# Patient Record
Sex: Male | Born: 1970 | Race: Black or African American | Hispanic: No | Marital: Single | Smoking: Light tobacco smoker
Health system: Southern US, Community
[De-identification: ages and names within clinical notes are randomized; demographics above are authoritative.]

## PROBLEM LIST (undated history)

## (undated) DIAGNOSIS — I10 Essential (primary) hypertension: Secondary | ICD-10-CM

## (undated) DIAGNOSIS — F329 Major depressive disorder, single episode, unspecified: Secondary | ICD-10-CM

## (undated) DIAGNOSIS — F32A Depression, unspecified: Secondary | ICD-10-CM

## (undated) HISTORY — PX: WRIST SURGERY: SHX841

---

## 2007-01-25 ENCOUNTER — Ambulatory Visit: Payer: Self-pay | Admitting: Family Medicine

## 2007-01-25 ENCOUNTER — Ambulatory Visit: Payer: Self-pay | Admitting: *Deleted

## 2007-11-26 ENCOUNTER — Ambulatory Visit: Payer: Self-pay | Admitting: Internal Medicine

## 2007-11-29 ENCOUNTER — Ambulatory Visit: Payer: Self-pay | Admitting: Internal Medicine

## 2008-03-14 ENCOUNTER — Ambulatory Visit: Payer: Self-pay | Admitting: Internal Medicine

## 2008-03-15 ENCOUNTER — Inpatient Hospital Stay (HOSPITAL_COMMUNITY): Admission: EM | Admit: 2008-03-15 | Discharge: 2008-03-16 | Payer: Self-pay | Admitting: Emergency Medicine

## 2008-03-15 ENCOUNTER — Encounter (INDEPENDENT_AMBULATORY_CARE_PROVIDER_SITE_OTHER): Payer: Self-pay | Admitting: Emergency Medicine

## 2008-03-15 IMAGING — US US RENAL
1 series · 14 of 23 positions shown · non-contrast
Comparison: None

CLINICAL DATA: Proteinuria

RENAL/URINARY TRACT ULTRASOUND
TECHNIQUE: Complete ultrasound examination of the urinary tract
was performed including evaluation of the kidneys renal collecting
systems and urinary bladder.

[Series 1: unknown · 0.38mm/px · 14 of 23 slices shown]
[im 1/23]
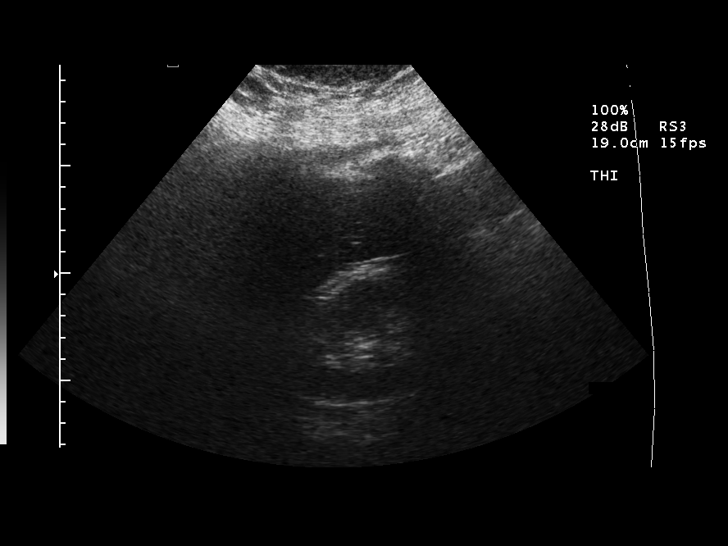
[im 3/23]
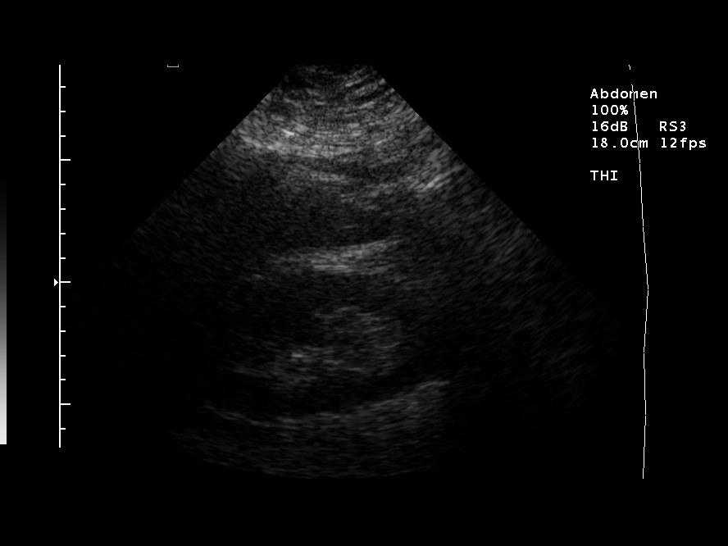
[im 5/23]
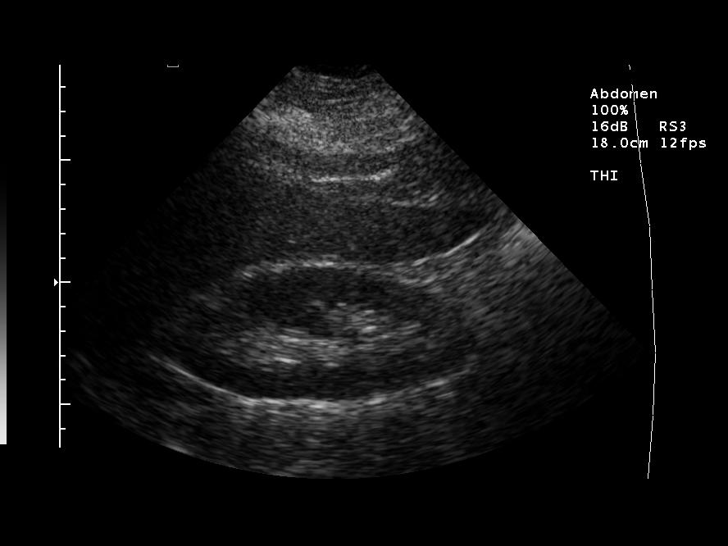
[im 6/23]
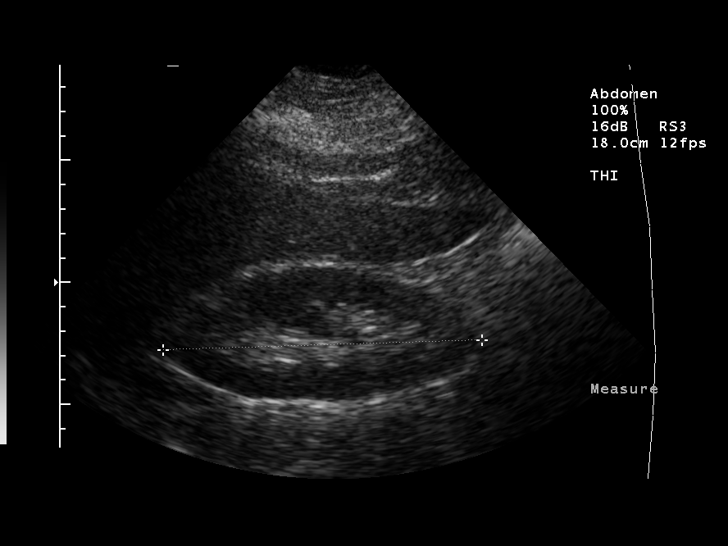
[im 8/23]
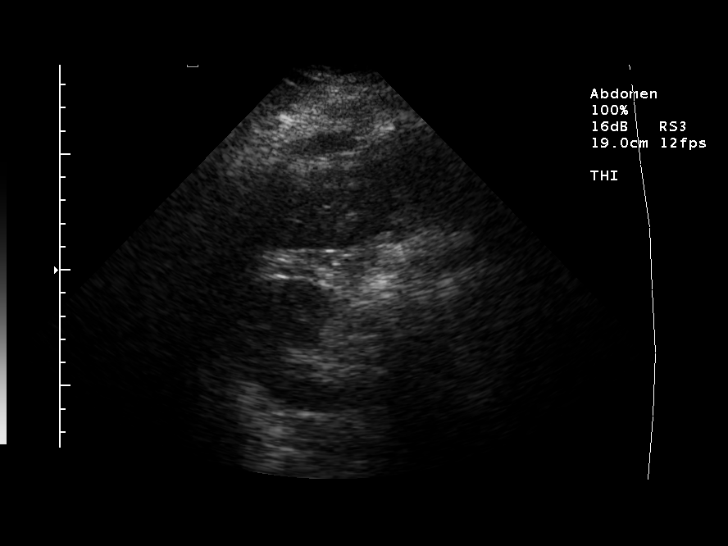
[im 10/23]
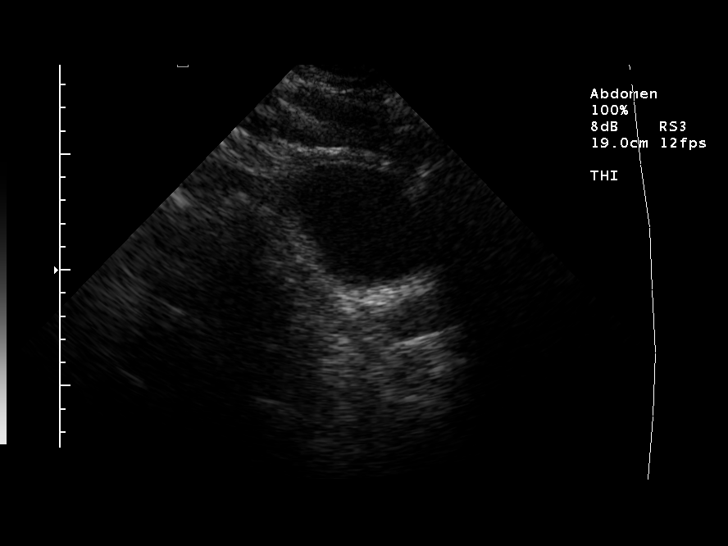
[im 11/23]
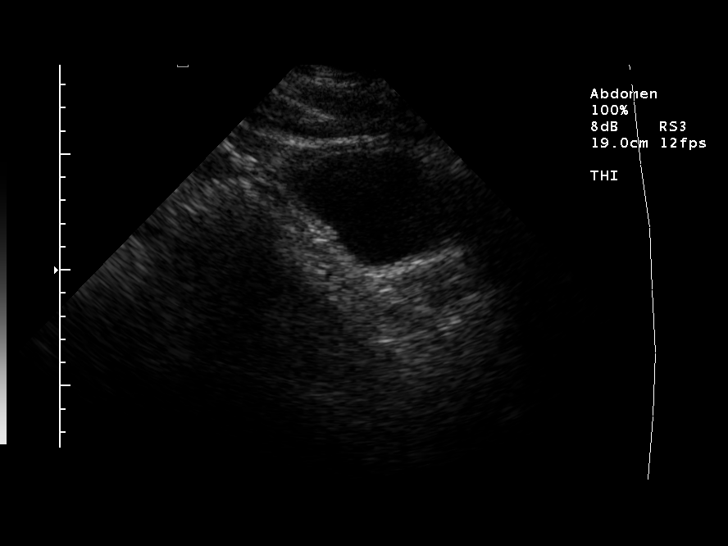
[im 13/23]
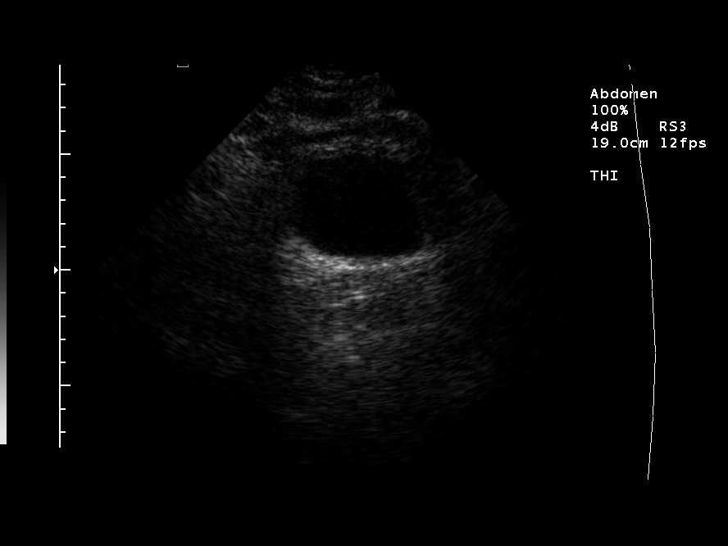
[im 14/23]
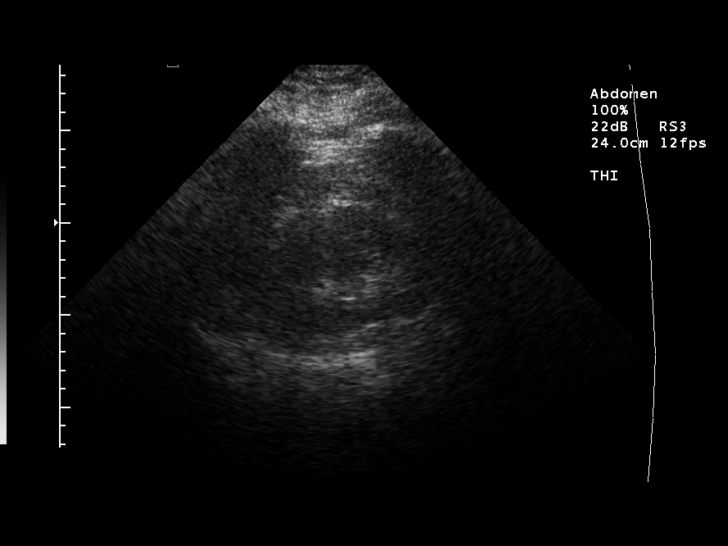
[im 16/23]
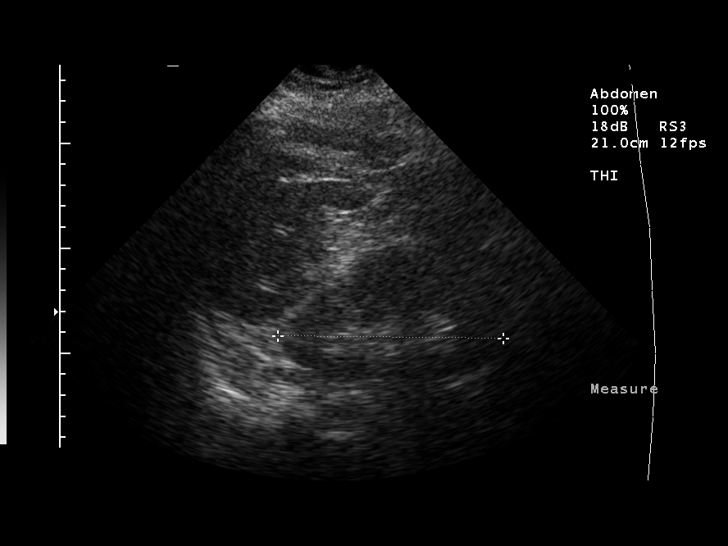
[im 18/23]
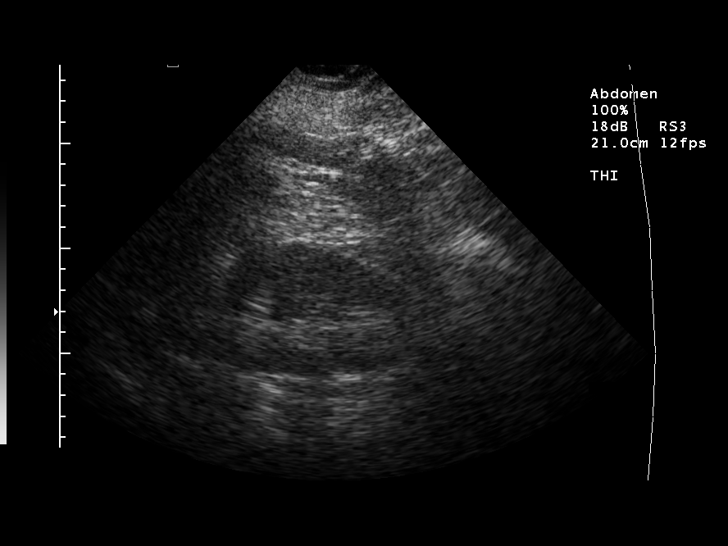
[im 19/23]
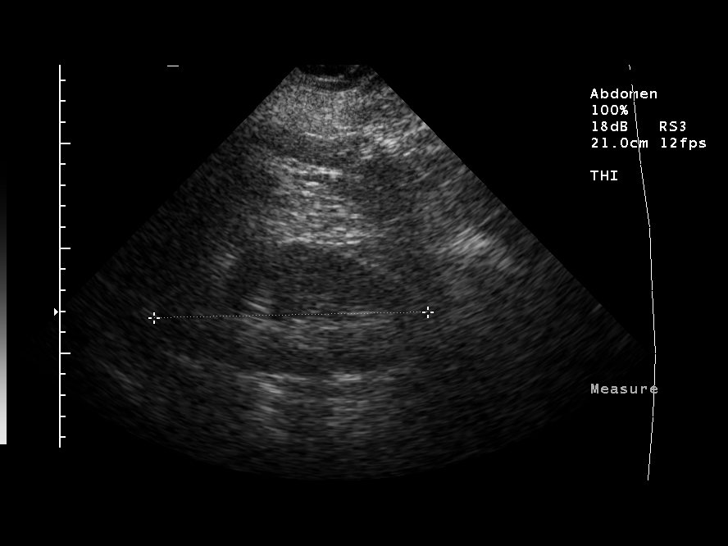
[im 21/23]
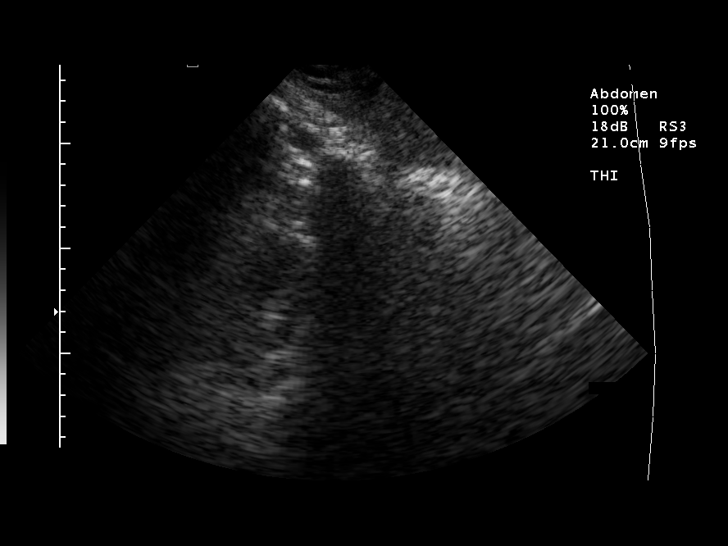
[im 23/23]
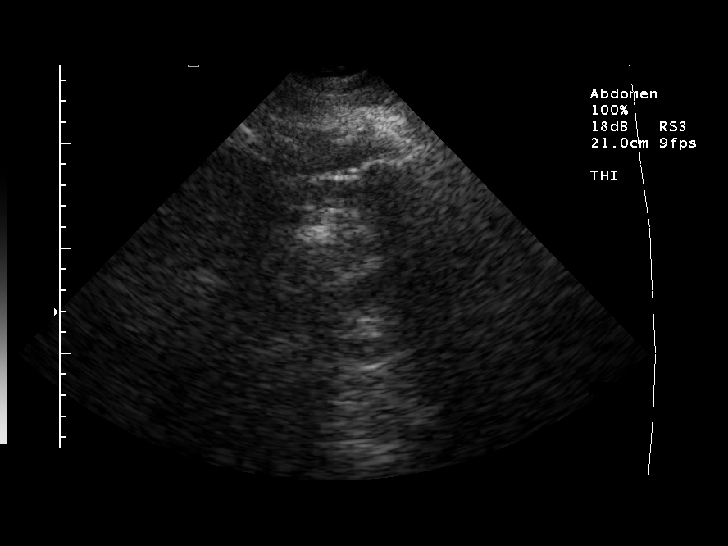

[14 of 23 positions shown; findings below may reference images not displayed]

FINDINGS: The right kidney measures 13.0 cm.  The left kidney
measures 13.0 cm.  Both kidneys demonstrate normal renal cortical
thickness and echogenicity without focal lesions or hydronephrosis.
No perinephric fluid collections are seen.  No renal calculi are
identified.

The bladder is unremarkable.
IMPRESSION: 1.  Unremarkable renal ultrasound examination.

## 2008-03-24 ENCOUNTER — Inpatient Hospital Stay (HOSPITAL_COMMUNITY): Admission: AC | Admit: 2008-03-24 | Discharge: 2008-03-27 | Payer: Self-pay

## 2008-05-02 ENCOUNTER — Encounter: Admission: RE | Admit: 2008-05-02 | Discharge: 2008-07-31 | Payer: Self-pay | Admitting: Orthopedic Surgery

## 2008-08-01 ENCOUNTER — Encounter: Admission: RE | Admit: 2008-08-01 | Discharge: 2008-08-28 | Payer: Self-pay | Admitting: Orthopedic Surgery
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
## 2011-01-07 NOTE — H&P (Signed)
[name] Donovan, [name] Donovan NO.:  [id number]   MEDICAL RECORD NO.:  [id number]          PATIENT TYPE:  EMS   LOCATION:  MAJO                         FACILITY:  [facility]   PHYSICIAN:  Lucita Ferrara, MD         DATE OF BIRTH:  1971/06/23   DATE OF ADMISSION:  03/14/2008  DATE OF DISCHARGE:                              HISTORY & PHYSICAL   PRIMARY CARE PHYSICIAN:  [facility].   HISTORY OF PRESENT ILLNESS:  The patient is a 40 year old who presents  with chest pain that has been ongoing for the last four days getting  progressively worse, located sharp, located in anterior precordial area,  worse upon movement and deep inspiration, gradual in nature.  The  patient apparently states that it radiates to the back.  He denies any  orthopnea, paroxysmal nocturnal dyspnea, lower extremity edema or other  issues.  Chest pain is not related to food.  He denies any fevers,  chills, cough.  He denies any gastritis, gastroesophageal reflux  disease.  He denies any nausea, vomiting.  Risk factors include  hypertension, current smoker, morbid obesity and medication  noncompliance.   PAST MEDICAL HISTORY:  Significant for hypertension.   SOCIAL HISTORY:  He is a smoker, he occasionally smokes marijuana,  occasionally drinks.   ALLERGIES:  Allergic to penicillin which causes a rash.   MEDICATIONS:  Hydrochlorothiazide 25 mg p.o. daily.   REVIEW OF SYSTEMS:  A 12-point otherwise negative.   PHYSICAL EXAMINATION:  GENERAL:  The patient is a morbidly obese male in  moderate distress secondary to his pain that is still currently there.  HEENT: Normocephalic, atraumatic.  Sclerae nonicteric.  NECK:  Supple, no JVD, no carotid bruits.  CARDIOVASCULAR:  S1-S2, regular rate and rhythm.  No murmurs, rubs,  clicks.  ABDOMEN:  Soft, nontender, nondistended.  Positive bowel sounds.  Obese.  LUNGS:  Clear to auscultation bilaterally.  No rhonchi, rales.  There is  some expiratory wheezes.  NEUROLOGICAL:  Patient is alert and oriented x3.  Cranial nerves II-XII  grossly intact.   STUDIES:  EKG shows nonspecific ST-T wave changes, rate 70, no prior EKG  for comparison.  Troponin first set, less than 0.05.  Urinalysis trace  leukocytes, greater than 300 protein, second set of troponins also  negative.  Beta natruretic peptide 35, hemoglobin is 15.8, hematocrit  49.1, platelets 232.  Basic metabolic panel normal.  Chest x-ray shows  mild cardiomegaly and no active lung disease.   ASSESSMENT/PLAN:  A 40 year old with;  1. Chest pain radiating to the back, risk factors include morbid      obesity, smoker, unlikely the patient has aortic dissection.  He is      hemodynamically stable.  He does not have a wide pulse pressure.  2. Increased hemoglobin, rule out chronic hypoxemia, likely secondary      to smoking versus consider obstructive sleep apnea, obstructive      sleep apnea is likely given his body habitus.  3. 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  Consider      nephrotic syndrome secondary to hypertensive nephropathy  likely      early.  4. Uncontrolled hypertension.  5. Positive leukocyte esterase on UA.  6. Morbid obesity.   PLAN:  looks like the patient is pretty noncompliant on his hypertensive  medications.  Yet given that the patient actually has a nephrotic range  proteinuria, it is possible that he has renal dysfunction or secondary  causes of uncontrolled hypertension.  We must rule out renal artery  stenosis, in addition to of working up his chest pain.  In regards to  his chest pain, will initiate acute coronary syndrome protocol.  Will  cycle cardiac enzymes x3 q.8 h, 2-D echocardiogram in the morning.  The  patient also will get bilateral renal ultrasound, will initiate blood  pressure control with metoprolol 25 mg b.i.d., nitro for chest pain as  needed, aspirin and supplemental oxygen, smoking cessation, both  literature and nicotine  patch will be offered, fasting lipid profile and  thyroid stimulating hormone.  The rest of the plans are dependent on his  progress.      Lucita Ferrara, MD  Electronically Signed     RR/MEDQ  D:  03/15/2008  T:  03/15/2008  Job:  [id number]
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
## 2011-01-07 NOTE — Discharge Summary (Signed)
[name], [name]            ACCOUNT NO.:  [id number]   MEDICAL RECORD NO.:  [id number]          PATIENT TYPE:  INP   LOCATION:  4742                         FACILITY:  [facility]   PHYSICIAN:  Hillery Aldo, M.D.   DATE OF BIRTH:  02-Oct-1970   DATE OF ADMISSION:  03/14/2008  DATE OF DISCHARGE:  03/16/2008                               DISCHARGE SUMMARY   PRIMARY CARE PHYSICIAN:  Dr. Reche Dixon at [facility].   DISCHARGE DIAGNOSES:  1. Atypical chest pain, acute coronary syndrome, ruled out.  2. Morbid obesity.  3. Proteinuria.  4. Uncontrolled hypertension.  5. Hyperhomocysteinemia.  6. Dyslipidemia.  7. Tobacco abuse.   DISCHARGE MEDICATIONS:  1. Lisinopril/HCTZ 20/25 mg daily.  2. Metoprolol 25 mg b.i.d.  3. Foltx 1 tablet daily.  4. Aspirin 81 mg daily.  5. Oxycodone/APAP 1-2 q.6h. p.r.n. pain.  6. Lovastatin 20 mg daily.   CONSULTATIONS:  None.   BRIEF ADMISSION HISTORY OF PRESENT ILLNESS:  The patient is a 40-year-  old male who presented to the hospital with a chief complaint of chest  pain for 4 days prior to presentation.  The pain was sharp, located in  the anterior precordial area, and was worse with movement and deep  inspiration.  Apparently, the patient had been moving heavy objects  prior to the onset of this pain.  Nevertheless, due to the patient's  risk factor profile, he was admitted for further evaluation and workup.  For the full details, please see the dictated report done by Dr. Flonnie Overman.   PROCEDURES AND DIAGNOSTIC STUDIES:  1. Chest x-ray on March 14, 2008, showed mild cardiomegaly with no      active lung disease.  2. CT angiogram of the chest on March 15, 2008, showed no evidence of      acute pulmonary emboli.  Cardiomegaly with left ventricular      predominance.  No acute cardiopulmonary disease.  3. Renal ultrasound on March 15, 2008, was unremarkable.  4. Two-dimensional echocardiogram on March 15, 2008, showed normal left      ventricular  systolic function with ejection fraction estimated at      60%.  The study was inadequate for the evaluation of left      ventricular regional wall motion.  Left ventricular wall thickness      was moderately increased.  There was mild aortic root dilatation.      There was mild mitral annular calcification.  The left atrium was      mildly dilated.   DISCHARGE LABORATORY VALUES:  Cardiac markers were negative x3 sets.  Homocystine was 16.  TSH was 2.597.  Cholesterol was 216, triglycerides  139, HDL 33, and LDL 155.   HOSPITAL COURSE:  1. Atypical chest pain.  The patient was admitted and acute coronary      syndrome was ruled out by 3 sets of cardiac markers.  The patient      was monitored closely on telemetry and did not have any evidence of      arrhythmia.  A 12-lead EKG tracing showed normal sinus rhythm.  No  ST or T-wave abnormalities.  Given the markedly atypical nature of      the patient's pain, it was felt that this pain was most likely      musculoskeletal in origin and the patient was put on nonsteroidal      anti-inflammatory medicines with reasonable relief of his pain      prior to discharge.  2. Morbid obesity.  The patient was counseled extensively with regard      to the importance of weight loss and diet.  He was provided with a      written handout with regard to appropriate diet changes prior to      discharge.  3. Proteinuria.  The patient does have proteinuria with a protein-      creatinine ratio found at 0.48.  This is likely due to hypertensive      nephropathy.  Given this, an ACE inhibitor was added to his      medication list.  4. Uncontrolled hypertension.  The patient was on hydrochlorothiazide      monotherapy as an outpatient.  Lisinopril and metoprolol were added      and he will need close followup to ensure that his blood pressure      is adequately controlled on this regimen.  5. Hyperhomocysteinemia.  The patient was started on Foltx  therapy.  6. Dyslipidemia.  The patient was advised to resume his home dose of      Lipitor at discharge.   DISPOSITION:  The patient is medically stable.  He will follow up at  [facility].      Hillery Aldo, M.D.  Electronically Signed     CR/MEDQ  D:  03/16/2008  T:  03/17/2008  Job:  [id number]   cc:   Dineen Kid. Reche Dixon, M.D.
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
## 2011-03-11 ENCOUNTER — Emergency Department (HOSPITAL_COMMUNITY)
Admission: EM | Admit: 2011-03-11 | Discharge: 2011-03-12 | Disposition: A | Discharge status: Home / Self Care | Payer: Self-pay | Attending: Emergency Medicine | Admitting: Emergency Medicine

## 2011-03-11 DIAGNOSIS — F3289 Other specified depressive episodes: Secondary | ICD-10-CM | POA: Insufficient documentation

## 2011-03-11 DIAGNOSIS — Z5900 Homelessness unspecified: Secondary | ICD-10-CM | POA: Insufficient documentation

## 2011-03-11 DIAGNOSIS — I10 Essential (primary) hypertension: Secondary | ICD-10-CM | POA: Insufficient documentation

## 2011-03-11 DIAGNOSIS — F329 Major depressive disorder, single episode, unspecified: Secondary | ICD-10-CM | POA: Insufficient documentation

## 2011-03-11 LAB — COMPREHENSIVE METABOLIC PANEL
BUN: 11 mg/dL (ref 6–23)
Calcium: 9.2 mg/dL (ref 8.4–10.5)
Creatinine, Ser: 1.31 mg/dL (ref 0.50–1.35)
GFR calc Af Amer: >60 mL/min (ref >60)
Glucose, Bld: 105 mg/dL — ABNORMAL HIGH (ref 70–99)
Sodium: 139 mEq/L (ref 135–145)
Total Protein: 8.3 g/dL (ref 6.0–8.3)

## 2011-03-11 LAB — DIFFERENTIAL
Basophils Relative: 1 % (ref 0–1)
Lymphs Abs: 2.5 10*3/uL (ref 0.7–4.0)
Monocytes Absolute: 0.6 10*3/uL (ref 0.1–1.0)
Monocytes Relative: 6 % (ref 3–12)
Neutro Abs: 6.6 10*3/uL (ref 1.7–7.7)

## 2011-03-11 LAB — CBC
HCT: 46.9 % (ref 39.0–52.0)
Hemoglobin: 16 g/dL (ref 13.0–17.0)
MCH: 24.6 pg — ABNORMAL LOW (ref 26.0–34.0)
MCHC: 34.1 g/dL (ref 30.0–36.0)
MCV: 72.2 fL — ABNORMAL LOW (ref 78.0–100.0)

## 2011-03-11 LAB — ETHANOL: Alcohol, Ethyl (B): 311 mg/dL — ABNORMAL HIGH (ref 0–11)

## 2011-03-11 LAB — RAPID URINE DRUG SCREEN, HOSP PERFORMED
Benzodiazepines: NOT DETECTED
Cocaine: NOT DETECTED
Opiates: NOT DETECTED

## 2011-05-23 LAB — CBC
HCT: 47.6
Hemoglobin: 15.2
Hemoglobin: 14.2
MCV: 76 — ABNORMAL LOW
Platelets: 252
Platelets: 232
RBC: 6.46 — ABNORMAL HIGH
RBC: 5.81
WBC: 8.2
WBC: 9

## 2011-05-23 LAB — POCT I-STAT, CHEM 8
BUN: 10
Calcium, Ion: 1.06 — ABNORMAL LOW
Calcium, Ion: 1.02 — ABNORMAL LOW
Chloride: 107
Glucose, Bld: 91
HCT: 52
HCT: 53 — ABNORMAL HIGH
Hemoglobin: 18 — ABNORMAL HIGH
Potassium: 3.7
Sodium: 140
Sodium: 139
TCO2: 20
TCO2: 25

## 2011-05-23 LAB — TYPE AND SCREEN

## 2011-05-23 LAB — CARDIAC PANEL(CRET KIN+CKTOT+MB+TROPI)
CK, MB: 1.3
Total CK: 229
Troponin I: 0.01

## 2011-05-23 LAB — COMPREHENSIVE METABOLIC PANEL
ALT: 24
AST: 22
Albumin: 3.4 — ABNORMAL LOW
Alkaline Phosphatase: 60
CO2: 26
Chloride: 109
Creatinine, Ser: 1.14
GFR calc Af Amer: >60
GFR calc non Af Amer: >60
Potassium: 3.7
Sodium: 141
Total Bilirubin: 0.8

## 2011-05-23 LAB — PROTIME-INR: INR: 1

## 2011-05-23 LAB — DIFFERENTIAL
Eosinophils Absolute: 0.2
Lymphocytes Relative: 30
Lymphs Abs: 2.6
Monocytes Relative: 9
Neutro Abs: 5.1
Neutrophils Relative %: 57

## 2011-05-23 LAB — PROTEIN / CREATININE RATIO, URINE
Protein Creatinine Ratio: 0.48 — ABNORMAL HIGH
Total Protein, Urine: 41

## 2011-05-23 LAB — D-DIMER, QUANTITATIVE: D-Dimer, Quant: 0.5 — ABNORMAL HIGH

## 2011-05-23 LAB — CK TOTAL AND CKMB (NOT AT ARMC)
CK, MB: 1.1
Total CK: 259 — ABNORMAL HIGH

## 2011-05-23 LAB — URINALYSIS, ROUTINE W REFLEX MICROSCOPIC
Ketones, ur: 15 — AB
Nitrite: NEGATIVE
pH: 7.5

## 2011-05-23 LAB — TSH: TSH: 2.597

## 2011-05-23 LAB — TROPONIN I: Troponin I: 0.01

## 2011-05-23 LAB — POCT CARDIAC MARKERS
Myoglobin, poc: 54.1
Operator id: 277751
Operator id: 277751
Troponin i, poc: <0.05

## 2011-05-23 LAB — B-NATRIURETIC PEPTIDE (CONVERTED LAB): Pro B Natriuretic peptide (BNP): 35

## 2011-05-23 LAB — URINE MICROSCOPIC-ADD ON

## 2011-05-23 LAB — LIPID PANEL: VLDL: 28

## 2011-05-23 LAB — ABO/RH: ABO/RH(D): O POS

## 2012-01-05 ENCOUNTER — Emergency Department (HOSPITAL_COMMUNITY)
Admission: EM | Admit: 2012-01-05 | Discharge: 2012-01-05 | Disposition: A | Discharge status: Home / Self Care | Payer: Self-pay | Attending: Emergency Medicine | Admitting: Emergency Medicine

## 2012-01-05 ENCOUNTER — Encounter (HOSPITAL_COMMUNITY): Payer: Self-pay | Admitting: Emergency Medicine

## 2012-01-05 DIAGNOSIS — F329 Major depressive disorder, single episode, unspecified: Secondary | ICD-10-CM | POA: Insufficient documentation

## 2012-01-05 DIAGNOSIS — F3289 Other specified depressive episodes: Secondary | ICD-10-CM | POA: Insufficient documentation

## 2012-01-05 DIAGNOSIS — K047 Periapical abscess without sinus: Secondary | ICD-10-CM

## 2012-01-05 DIAGNOSIS — I10 Essential (primary) hypertension: Secondary | ICD-10-CM

## 2012-01-05 DIAGNOSIS — K0889 Other specified disorders of teeth and supporting structures: Secondary | ICD-10-CM

## 2012-01-05 HISTORY — DX: Depression, unspecified: F32.A

## 2012-01-05 HISTORY — DX: Major depressive disorder, single episode, unspecified: F32.9

## 2012-01-05 HISTORY — DX: Essential (primary) hypertension: I10

## 2012-01-05 MED ORDER — CLINDAMYCIN HCL 300 MG PO CAPS: 300.0000 mg | ORAL_CAPSULE | Freq: Four times a day (QID) | ORAL | Status: AC

## 2012-01-05 MED ORDER — IBUPROFEN 200 MG PO TABS
400.0000 mg | ORAL_TABLET | Freq: Once | ORAL | Status: AC
  Administered 2012-01-05: 400 mg via ORAL
  Filled 2012-01-05: qty 2

## 2012-01-05 MED ORDER — HYDROMORPHONE HCL PF 1 MG/ML IJ SOLN
1.0000 mg | Freq: Once | INTRAMUSCULAR | Status: AC
  Administered 2012-01-05: 1 mg via INTRAMUSCULAR
  Filled 2012-01-05: qty 1

## 2012-01-05 MED ORDER — OXYCODONE-ACETAMINOPHEN 5-325 MG PO TABS: 1.0000 | ORAL_TABLET | ORAL | Status: AC | PRN

## 2012-01-05 MED ORDER — AMLODIPINE BESYLATE 10 MG PO TABS: 10.0000 mg | ORAL_TABLET | Freq: Every day | ORAL | Status: DC

## 2012-01-05 NOTE — ED Notes (Signed)
Pt received to RM 5 with c/o toothache x 1 week with swelling noted to the rt side of the face. Pt has a hx of HTN and has been out of his BP medicine x 1 week.[name] Donovan

## 2012-01-05 NOTE — ED Notes (Signed)
Pt was instructed to take his BP medication as soon as he gets his prescription filled and to follow up with a doctor. Pt verbalized ubnderstanding

## 2012-01-05 NOTE — ED Notes (Signed)
Has been out of bp meds x a couple of weeks

## 2012-01-05 NOTE — ED Provider Notes (Signed)
History  This chart was scribed for [name] Razor, MD by Bennett Scrape. This patient was seen in room STRE5/STRE5 and the patient's care was started at 4:08PM.  CSN: [id number]  Arrival date & time 01/05/12  1346   First MD Initiated Contact with Patient 01/05/12 1608      Chief Complaint  Patient presents with  . Dental Pain    The history is provided by the patient. No language interpreter was used.    [name] Donovan is a 41 y.o. male who presents to the Emergency Department complaining of 5 to 6 days of right upper tooth pain attributed to an abscess with associated chills, facial swelling and nausea. Pt states that the pain became worse today after he sneezed and felt the abscess burst and begin to drain. He reports several episodes of emesis described as "spitting the abscess stuff back up". The pain is worse with swallowing and chewing. He has not  Taken any medications at home to improve his pain. He reports one pior episode of similar pain in the same area 2 to 3 years ago that was also attributed to an abscess. He denies diarrhea, fever and sore throat as associated symptoms. He has a h/o HTN and depression. He is a current everyday smoker and alcohol user.   Past Medical History  Diagnosis Date  . Hypertension   . Depression     No past surgical history on file.  No family history on file.  History  Substance Use Topics  . Smoking status: Current Everyday Smoker  . Smokeless tobacco: Not on file  . Alcohol Use: Yes      Review of Systems  Constitutional: Negative for fever and chills.  HENT: Positive for facial swelling and dental problem. Negative for trouble swallowing.   Respiratory: Negative for shortness of breath.   Gastrointestinal: Positive for nausea and vomiting. Negative for abdominal pain and diarrhea.  Neurological: Negative for weakness and headaches.  All other systems reviewed and are negative.    Allergies  Penicillins  Home  Medications   Current Outpatient Rx  Name Route Sig Dispense Refill  . AMLODIPINE BESYLATE 10 MG PO TABS Oral Take 10 mg by mouth daily.    . IBUPROFEN 200 MG PO TABS Oral Take 800 mg by mouth every 6 (six) hours as needed. For tooth pain      Triage Vitals: BP 187/119  Pulse 91  Temp 98.6 F (37 C)  Resp 18  SpO2 98%  Physical Exam  Nursing note and vitals reviewed. Constitutional: He is oriented to person, place, and time. He appears well-developed and well-nourished. No distress.  HENT:  Head: Normocephalic and atraumatic.       Right upper three molars surrounding ginga is inflamed, no abscess noted, no drainage noted, mild right facial swelling, posterior pharynx is clear, handling secretions, the submental tissue is soft  Eyes: EOM are normal.  Neck: Neck supple. No tracheal deviation present.  Cardiovascular: Normal rate.   Pulmonary/Chest: Effort normal. No respiratory distress.       Coarse breath sounds bilaterally  Musculoskeletal: Normal range of motion.  Lymphadenopathy:    He has no cervical adenopathy.  Neurological: He is alert and oriented to person, place, and time.  Skin: Skin is warm and dry.  Psychiatric: He has a normal mood and affect. His behavior is normal.    ED Course  Procedures (including critical care time)  DIAGNOSTIC STUDIES: Oxygen Saturation is 98% on room air, normal by  my interpretation.    COORDINATION OF CARE: 4:30PM-Discussed antibiotics and pain medication as discharge plan with pt and pt agreed. Advised pt to see a dentist to follow up with. Will give pt a pain shot before discharge.   Labs Reviewed - No data to display No results found.   1. Dental abscess   2. Pain, dental   3. Hypertension       MDM  41 year old male with dental pain. Patient reports purulent discharge. No discrete drainable collection noted on my exam. Patient is afebrile. No evidence of deep space infection. NO evidence of airway compromise. Patient  is penicillin allergic. Has tolerated clindamycin in the past. Does not have insurance. Patient was given paperwork on how to proceed in obtaining his orange card. History of hypertension and previously prescribed Norvasc. Patient has not been taking because he did not have a refill for his meds. Prescription for additional amlodipine provided. Pain medication. Dental referral. Resource list provided.   I personally preformed the services scribed in my presence. The recorded information has been reviewed and considered. [name] Razor, MD.    [name] Razor, MD 01/05/12 [phone]

## 2012-01-05 NOTE — Discharge Instructions (Signed)
Abscessed Tooth A tooth abscess is a collection of infected fluid (pus) from a bacterial infection in the inner part of the tooth (pulp). It usually occurs at the end of the tooth's root.  CAUSES   A very bad cavity (extensive tooth decay).   Trauma to the tooth, such as a broken or chipped tooth, that allows bacteria to enter into the pulp.  SYMPTOMS  Severe pain in and around the infected tooth.   Swelling and redness around the abscessed tooth or in the mouth or face.   Tenderness.   Pus drainage.   Bad breath.   Bitter taste in the mouth.   Difficulty swallowing.   Difficulty opening the mouth.   Feeling sick to your stomach (nauseous).   Vomiting.   Chills.   Swollen neck glands.  DIAGNOSIS  A medical and dental history will be taken.   An examination will be performed by tapping on the abscessed tooth.   X-rays may be taken of the tooth to identify the abscess.  TREATMENT The goal of treatment is to eliminate the infection.   You may be prescribed antibiotic medicine to stop the infection from spreading.   A root canal may be performed to save the tooth. If the tooth cannot be saved, it may be pulled (extracted) and the abscess may be drained.  HOME CARE INSTRUCTIONS  Only take over-the-counter or prescription medicines for pain, fever, or discomfort as directed by your caregiver.   Do not drive after taking pain medicine (narcotics).   Rinse your mouth (gargle) often with salt water ( tsp salt in 8 oz of warm water) to relieve pain or swelling.   Do not apply heat to the outside of your face.   Return to your dentist for further treatment as directed.  SEEK IMMEDIATE DENTAL CARE IF:  You have a temperature by mouth above 102 F (38.9 C), not controlled by medicine.   You have chills or a very bad headache.   You have problems breathing or swallowing.   Your have trouble opening your mouth.   You develop swelling in the neck or around the eye.    Your pain is not helped by medicine.   Your pain is getting worse instead of better.  Document Released: 08/11/2005 Document Revised: 07/31/2011 Document Reviewed: 11/19/2010 [facility] Patient Information 2012 [place], [place].Aspiration Precautions Aspiration is the inhaling of a liquid or object into the lungs. Things that can be inhaled into the lungs include:  Food.   Any type of liquid, such as drinks or saliva.   Stomach contents, such as vomit or stomach acid.  When these things go into the lungs, damage can occur. Serious complications can then result, such as:  A lung infection (pneumonia).   A collection of pus in the lungs (lung abscess).  CAUSES  A decreased level of awareness (consciousness) due to:   Traumatic brain injury or head injury.   Stroke.   Neurological disease.   Seizures.   Decreased or absent gag reflex (inability to cough).   Medical conditions that affect swallowing.   Conditions that affect the food pipe (esophagus) such as a narrowing of the esophagus (esophageal stricture).   Gastroesophageal reflux (GERD). This is also known as acid reflux.   Any type of surgery where you are put under general anesthesia or have sedation.   Drinking large amounts of alcohol.   Taking medication that causes drowsiness, confusion, or weakness.   Aging.   Dental problems.  Having a feeding tube.  SYMPTOMS When aspiration occurs, different signs and symptoms can occur, such as:  Coughing (if a person has a cough or gag reflex) after swallowing food or liquids.   Difficulty breathing. This can include things like:   Breathing rapidly.   Breathing very slowly.   Hearing "gurgling" lung sounds when a person breaths.   Coughing up phlegm (sputum) that is:   Yellow, tan, or green in color.   Has pieces of food in it.   Bad smelling.   A change in voice (hoarseness).   A change in skin color. The skin may turn a "bluish" type color  because of a lack of oxygen (cyanosis).   Fever.  DIAGNOSIS  A chest X-ray may be performed. This takes a picture of your lungs. It can show changes in the lungs if aspiration has occurred.   A bronchoscopy may be performed. This is a surgical procedure in which a thin, flexible tube with a camera at the end is inserted into the nose or mouth. The tube is advanced to the lungs so your caregiver can view the lungs and obtain a culture, tissue sample, or remove an aspirated object.   A swallowing evaluation study may be performed to evaluate:   A person's risk of aspiration.   How difficult it is for a person to swallow.   What types of foods are safe for a person to eat.  PREVENTION If you are a caregiver to someone who may aspirate, follow the directions below. If you are caring for someone who can eat and drink through their mouth:  Have them sit in an upright position when eating food or drinking fluids, such as:   Sitting up in a chair.   If sitting in a chair is not possible, position the person in bed so they are upright.   Remind the person to eat slowly and chew well.   Do not distract the person. This is especially important for people with thinking or memory (cognitive) problems.   Check the person's mouth for leftover food after eating.   Keep the person sitting upright for 30 to 45 minutes after eating.   Do not serve food or drink for at least 2 hours before bedtime.  If you are caring for someone with a feeding tube and he or she cannot eat or drink through their mouth:  Keep the person in an upright position as much as possible.   Do not  lay the person flat if they are getting continuous feedings. Turn the feeding pump off if you need to lay the person flat for any reason.   Check feeding tube residuals as directed by your caregiver. If a large amount of tube feedings are pulled back (aspirated) from the feeding tube, call your caregiver right away.  General  guidelines to prevent aspiration include:  Feed small amounts of food. Do not force feed.   Use as little water as possible when brushing the person's teeth or cleaning his or her mouth.   Provide oral care before and after meals.   Never put food or fluids in the mouth of a person who is not fully alert.   Crush pills and put them in soft food such as pudding or ice cream. Some pills should not be crushed. Check with your caregiver before crushing any medication.  SEEK IMMEDIATE MEDICAL CARE IF:   The person has trouble breathing or starts to breathe rapidly.   The  person is breathing very slowly or stops breathing.   The person coughs a lot after eating or drinking.   The person has a chronic cough.   The person coughs up thick, yellow, or tan sputum.   The person has a fever or persistent symptoms for more than 72 hours.   The person has a fever and their symptoms suddenly get worse.  Document Released: 09/13/2010 Document Revised: 07/31/2011 Document Reviewed: 09/13/2010 [facility] Patient Information 2012 [place], [place].Arterial Hypertension Arterial hypertension (high blood pressure) is a condition of elevated pressure in your blood vessels. Hypertension over a long period of time is a risk factor for strokes, heart attacks, and heart failure. It is also the leading cause of kidney (renal) failure.  CAUSES   In Adults -- Over 90% of all hypertension has no known cause. This is called essential or primary hypertension. In the other 10% of people with hypertension, the increase in blood pressure is caused by another disorder. This is called secondary hypertension. Important causes of secondary hypertension are:   Heavy alcohol use.   Obstructive sleep apnea.   Hyperaldosterosim (Conn's syndrome).   Steroid use.   Chronic kidney failure.   Hyperparathyroidism.   Medications.   Renal artery stenosis.   Pheochromocytoma.   Cushing's disease.   Coarctation of the  aorta.   Scleroderma renal crisis.   Licorice (in excessive amounts).   Drugs (cocaine, methamphetamine).  Your caregiver can explain any items above that apply to you.  In Children -- Secondary hypertension is more common and should always be considered.   Pregnancy -- Few women of childbearing age have high blood pressure. However, up to 10% of them develop hypertension of pregnancy. Generally, this will not harm the woman. It may be a sign of 3 complications of pregnancy: preeclampsia, HELLP syndrome, and eclampsia. Follow up and control with medication is necessary.  SYMPTOMS   This condition normally does not produce any noticeable symptoms. It is usually found during a routine exam.   Malignant hypertension is a late problem of high blood pressure. It may have the following symptoms:   Headaches.   Blurred vision.   End-organ damage (this means your kidneys, heart, lungs, and other organs are being damaged).   Stressful situations can increase the blood pressure. If a person with normal blood pressure has their blood pressure go up while being seen by their caregiver, this is often termed "white coat hypertension." Its importance is not known. It may be related with eventually developing hypertension or complications of hypertension.   Hypertension is often confused with mental tension, stress, and anxiety.  DIAGNOSIS  The diagnosis is made by 3 separate blood pressure measurements. They are taken at least 1 week apart from each other. If there is organ damage from hypertension, the diagnosis may be made without repeat measurements. Hypertension is usually identified by having blood pressure readings:  Above 140/90 mmHg measured in both arms, at 3 separate times, over a couple weeks.   Over 130/80 mmHg should be considered a risk factor and may require treatment in patients with diabetes.  Blood pressure readings over 120/80 mmHg are called "pre-hypertension" even in  non-diabetic patients. To get a true blood pressure measurement, use the following guidelines. Be aware of the factors that can alter blood pressure readings.  Take measurements at least 1 hour after caffeine.   Take measurements 30 minutes after smoking and without any stress. This is another reason to quit smoking - it raises your  blood pressure.   Use a proper cuff size. Ask your caregiver if you are not sure about your cuff size.   Most home blood pressure cuffs are automatic. They will measure systolic and diastolic pressures. The systolic pressure is the pressure reading at the start of sounds. Diastolic pressure is the pressure at which the sounds disappear. If you are elderly, measure pressures in multiple postures. Try sitting, lying or standing.   Sit at rest for a minimum of 5 minutes before taking measurements.   You should not be on any medications like decongestants. These are found in many cold medications.   Record your blood pressure readings and review them with your caregiver.  If you have hypertension:  Your caregiver may do tests to be sure you do not have secondary hypertension (see "causes" above).   Your caregiver may also look for signs of metabolic syndrome. This is also called Syndrome X or Insulin Resistance Syndrome. You may have this syndrome if you have type 2 diabetes, abdominal obesity, and abnormal blood lipids in addition to hypertension.   Your caregiver will take your medical and family history and perform a physical exam.   Diagnostic tests may include blood tests (for glucose, cholesterol, potassium, and kidney function), a urinalysis, or an EKG. Other tests may also be necessary depending on your condition.  PREVENTION  There are important lifestyle issues that you can adopt to reduce your chance of developing hypertension:  Maintain a normal weight.   Limit the amount of salt (sodium) in your diet.   Exercise often.   Limit alcohol intake.     Get enough potassium in your diet. Discuss specific advice with your caregiver.   Follow a DASH diet (dietary approaches to stop hypertension). This diet is rich in fruits, vegetables, and low-fat dairy products, and avoids certain fats.  PROGNOSIS  Essential hypertension cannot be cured. Lifestyle changes and medical treatment can lower blood pressure and reduce complications. The prognosis of secondary hypertension depends on the underlying cause. Many people whose hypertension is controlled with medicine or lifestyle changes can live a normal, healthy life.  RISKS AND COMPLICATIONS  While high blood pressure alone is not an illness, it often requires treatment due to its short- and long-term effects on many organs. Hypertension increases your risk for:  CVAs or strokes (cerebrovascular accident).   Heart failure due to chronically high blood pressure (hypertensive cardiomyopathy).   Heart attack (myocardial infarction).   Damage to the retina (hypertensive retinopathy).   Kidney failure (hypertensive nephropathy).  Your caregiver can explain list items above that apply to you. Treatment of hypertension can significantly reduce the risk of complications. TREATMENT   For overweight patients, weight loss and regular exercise are recommended. Physical fitness lowers blood pressure.   Mild hypertension is usually treated with diet and exercise. A diet rich in fruits and vegetables, fat-free dairy products, and foods low in fat and salt (sodium) can help lower blood pressure. Decreasing salt intake decreases blood pressure in a 1/3 of people.   Stop smoking if you are a smoker.  The steps above are highly effective in reducing blood pressure. While these actions are easy to suggest, they are difficult to achieve. Most patients with moderate or severe hypertension end up requiring medications to bring their blood pressure down to a normal level. There are several classes of medications for  treatment. Blood pressure pills (antihypertensives) will lower blood pressure by their different actions. Lowering the blood pressure by 10 mmHg  may decrease the risk of complications by as much as 25%. The goal of treatment is effective blood pressure control. This will reduce your risk for complications. Your caregiver will help you determine the best treatment for you according to your lifestyle. What is excellent treatment for one person, may not be for you. HOME CARE INSTRUCTIONS   Do not smoke.   Follow the lifestyle changes outlined in the "Prevention" section.   If you are on medications, follow the directions carefully. Blood pressure medications must be taken as prescribed. Skipping doses reduces their benefit. It also puts you at risk for problems.   Follow up with your caregiver, as directed.   If you are asked to monitor your blood pressure at home, follow the guidelines in the "Diagnosis" section above.  SEEK MEDICAL CARE IF:   You think you are having medication side effects.   You have recurrent headaches or lightheadedness.   You have swelling in your ankles.   You have trouble with your vision.  SEEK IMMEDIATE MEDICAL CARE IF:   You have sudden onset of chest pain or pressure, difficulty breathing, or other symptoms of a heart attack.   You have a severe headache.   You have symptoms of a stroke (such as sudden weakness, difficulty speaking, difficulty walking).  MAKE SURE YOU:   Understand these instructions.   Will watch your condition.   Will get help right away if you are not doing well or get worse.  Document Released: 08/11/2005 Document Revised: 07/31/2011 Document Reviewed: 03/11/2007 [facility] Patient Information 2012 [place], [place].  RESOURCE GUIDE  Dental Problems  Patients with Medicaid: [facility] [phone] [address]                                           [phone] [address] Phone:   [phone]                                                  Phone:  [phone]  If unable to pay or uninsured, contact:  [facility] or [facility] to become qualified for the adult dental clinic.  Chronic Pain Problems Contact [facility]  [phone] Patients need to be referred by their primary care doctor.  Insufficient Money for Medicine Contact [employer]:  call "211" or [facility] (214) 871-4943.  No Primary Care Doctor Call [facility]  [phone] Other agencies that provide inexpensive medical care    [facility]  [phone]    [facility]  [phone]    [facility]  [phone]    [facility]  [phone]    [facility]  903-531-8157    [facility]  [phone]  Psychological Services [facility]  [phone] [facility]  [phone] [facility]   [phone] (emergency services 617-404-2640)  Substance Abuse Resources [facility]  308-365-8007 [facility] 581-641-5673 [facility] 719-272-4324 Floydene Flock 205-196-0659 Residential & Outpatient Substance Abuse Program  (304) 474-3782  Abuse/Neglect [facility] Child Abuse Hotline (531)220-4941 [place]  [address] Child Abuse Hotline 262-807-2790 (After Hours)  Emergency Shelter [facility] 587-610-8066  Maternity Homes [facility] [place] [facility] (458) 860-7831 [facility] 947-061-3419  MRSA Hotline #:   (564)704-1726    [facility] Resources  [facility]     [employer]                          [facility] Dept. [address] [place]                       [address]      [address]  Blondell Reveal Phone:  [phone]                                   Phone:  [phone]                 Phone:   [phone]  [facility] Phone:  [phone]  [facility] Child Abuse Hotline (661)257-1074 [phone] (After Hours)
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
## 2012-08-15 ENCOUNTER — Encounter (HOSPITAL_COMMUNITY): Payer: Self-pay | Admitting: Emergency Medicine

## 2012-08-15 ENCOUNTER — Emergency Department (HOSPITAL_COMMUNITY): Payer: Self-pay

## 2012-08-15 ENCOUNTER — Emergency Department (HOSPITAL_COMMUNITY)
Admission: EM | Admit: 2012-08-15 | Discharge: 2012-08-15 | Disposition: A | Discharge status: Home Health | Payer: Self-pay | Attending: Emergency Medicine | Admitting: Emergency Medicine

## 2012-08-15 DIAGNOSIS — F17200 Nicotine dependence, unspecified, uncomplicated: Secondary | ICD-10-CM | POA: Insufficient documentation

## 2012-08-15 DIAGNOSIS — I10 Essential (primary) hypertension: Secondary | ICD-10-CM | POA: Insufficient documentation

## 2012-08-15 DIAGNOSIS — Z8659 Personal history of other mental and behavioral disorders: Secondary | ICD-10-CM | POA: Insufficient documentation

## 2012-08-15 DIAGNOSIS — S30811A Abrasion of abdominal wall, initial encounter: Secondary | ICD-10-CM

## 2012-08-15 DIAGNOSIS — S0081XA Abrasion of other part of head, initial encounter: Secondary | ICD-10-CM

## 2012-08-15 DIAGNOSIS — T07XXXA Unspecified multiple injuries, initial encounter: Secondary | ICD-10-CM

## 2012-08-15 DIAGNOSIS — IMO0002 Reserved for concepts with insufficient information to code with codable children: Secondary | ICD-10-CM | POA: Insufficient documentation

## 2012-08-15 DIAGNOSIS — S301XXA Contusion of abdominal wall, initial encounter: Secondary | ICD-10-CM | POA: Insufficient documentation

## 2012-08-15 DIAGNOSIS — F1010 Alcohol abuse, uncomplicated: Secondary | ICD-10-CM | POA: Insufficient documentation

## 2012-08-15 DIAGNOSIS — F10929 Alcohol use, unspecified with intoxication, unspecified: Secondary | ICD-10-CM

## 2012-08-15 DIAGNOSIS — Z23 Encounter for immunization: Secondary | ICD-10-CM | POA: Insufficient documentation

## 2012-08-15 LAB — CBC WITH DIFFERENTIAL/PLATELET
Basophils Relative: 1 % (ref 0–1)
HCT: 47.5 % (ref 39.0–52.0)
Hemoglobin: 16.2 g/dL (ref 13.0–17.0)
MCHC: 34.1 g/dL (ref 30.0–36.0)
MCV: 72.3 fL — ABNORMAL LOW (ref 78.0–100.0)
Monocytes Absolute: 0.6 10*3/uL (ref 0.1–1.0)
Monocytes Relative: 7 % (ref 3–12)
Neutro Abs: 4.6 10*3/uL (ref 1.7–7.7)

## 2012-08-15 LAB — COMPREHENSIVE METABOLIC PANEL
ALT: 28 U/L (ref 0–53)
Alkaline Phosphatase: 72 U/L (ref 39–117)
CO2: 26 mEq/L (ref 19–32)
Calcium: 8.7 mg/dL (ref 8.4–10.5)
GFR calc Af Amer: >90 mL/min (ref >90)
GFR calc non Af Amer: 82 mL/min — ABNORMAL LOW (ref >90)
Glucose, Bld: 82 mg/dL (ref 70–99)
Potassium: 3.9 mEq/L (ref 3.5–5.1)
Sodium: 142 mEq/L (ref 135–145)

## 2012-08-15 MED ORDER — IOHEXOL 300 MG/ML  SOLN
100.0000 mL | Freq: Once | INTRAMUSCULAR | Status: AC | PRN
  Administered 2012-08-15: 100 mL via INTRAVENOUS

## 2012-08-15 MED ORDER — TETANUS-DIPHTH-ACELL PERTUSSIS 5-2.5-18.5 LF-MCG/0.5 IM SUSP
0.5000 mL | Freq: Once | INTRAMUSCULAR | Status: AC
  Administered 2012-08-15: 0.5 mL via INTRAMUSCULAR
  Filled 2012-08-15: qty 0.5

## 2012-08-15 MED ORDER — IBUPROFEN 200 MG PO TABS: 800.0000 mg | ORAL_TABLET | Freq: Four times a day (QID) | ORAL | Status: DC | PRN
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
## 2012-08-15 NOTE — ED Notes (Signed)
Returned from CT. Pt is alert to verbal stimuli
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
## 2012-08-15 NOTE — ED Notes (Signed)
abd is soft
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
## 2012-08-15 NOTE — ED Notes (Signed)
Sandwich bag and drink given to pt

## 2012-08-15 NOTE — ED Notes (Signed)
Patient transported to CT 

## 2012-08-15 NOTE — ED Notes (Signed)
MD at bedside. 

## 2012-08-15 NOTE — ED Notes (Signed)
Pt reports that he has not been taking his HTN medicine.

## 2012-08-15 NOTE — ED Notes (Signed)
Pt is sleeping. Awakens easily.

## 2013-02-23 ENCOUNTER — Emergency Department (HOSPITAL_COMMUNITY): Payer: Self-pay

## 2013-02-23 ENCOUNTER — Emergency Department (HOSPITAL_COMMUNITY)
Admission: EM | Admit: 2013-02-23 | Discharge: 2013-02-23 | Disposition: A | Discharge status: Home / Self Care | Payer: Self-pay | Attending: Emergency Medicine | Admitting: Emergency Medicine

## 2013-02-23 ENCOUNTER — Encounter (HOSPITAL_COMMUNITY): Payer: Self-pay

## 2013-02-23 DIAGNOSIS — S90851A Superficial foreign body, right foot, initial encounter: Secondary | ICD-10-CM

## 2013-02-23 DIAGNOSIS — I10 Essential (primary) hypertension: Secondary | ICD-10-CM | POA: Insufficient documentation

## 2013-02-23 DIAGNOSIS — R0602 Shortness of breath: Secondary | ICD-10-CM | POA: Insufficient documentation

## 2013-02-23 DIAGNOSIS — N390 Urinary tract infection, site not specified: Secondary | ICD-10-CM | POA: Insufficient documentation

## 2013-02-23 DIAGNOSIS — F17200 Nicotine dependence, unspecified, uncomplicated: Secondary | ICD-10-CM | POA: Insufficient documentation

## 2013-02-23 DIAGNOSIS — Y9301 Activity, walking, marching and hiking: Secondary | ICD-10-CM | POA: Insufficient documentation

## 2013-02-23 DIAGNOSIS — Z8659 Personal history of other mental and behavioral disorders: Secondary | ICD-10-CM | POA: Insufficient documentation

## 2013-02-23 DIAGNOSIS — Y929 Unspecified place or not applicable: Secondary | ICD-10-CM | POA: Insufficient documentation

## 2013-02-23 DIAGNOSIS — W268XXA Contact with other sharp object(s), not elsewhere classified, initial encounter: Secondary | ICD-10-CM | POA: Insufficient documentation

## 2013-02-23 DIAGNOSIS — Z79899 Other long term (current) drug therapy: Secondary | ICD-10-CM | POA: Insufficient documentation

## 2013-02-23 DIAGNOSIS — Z880 Allergy status to penicillin: Secondary | ICD-10-CM | POA: Insufficient documentation

## 2013-02-23 DIAGNOSIS — IMO0002 Reserved for concepts with insufficient information to code with codable children: Secondary | ICD-10-CM | POA: Insufficient documentation

## 2013-02-23 LAB — CBC WITH DIFFERENTIAL/PLATELET
Basophils Relative: 1 % (ref 0–1)
Eosinophils Absolute: 0.1 10*3/uL (ref 0.0–0.7)
MCH: 25.8 pg — ABNORMAL LOW (ref 26.0–34.0)
MCHC: 34.3 g/dL (ref 30.0–36.0)
Neutrophils Relative %: 65 % (ref 43–77)
Platelets: 200 10*3/uL (ref 150–400)
RDW: 15.1 % (ref 11.5–15.5)

## 2013-02-23 LAB — BASIC METABOLIC PANEL
GFR calc Af Amer: >90 mL/min (ref >90)
GFR calc non Af Amer: 80 mL/min — ABNORMAL LOW (ref >90)
Potassium: 3.9 mEq/L (ref 3.5–5.1)
Sodium: 140 mEq/L (ref 135–145)

## 2013-02-23 LAB — URINE MICROSCOPIC-ADD ON

## 2013-02-23 LAB — URINALYSIS, ROUTINE W REFLEX MICROSCOPIC
Nitrite: POSITIVE — AB
Specific Gravity, Urine: 1.042 — ABNORMAL HIGH (ref 1.005–1.030)
Urobilinogen, UA: 1 mg/dL (ref 0.0–1.0)

## 2013-02-23 LAB — D-DIMER, QUANTITATIVE: D-Dimer, Quant: 1.03 ug/mL-FEU — ABNORMAL HIGH (ref 0.00–0.48)

## 2013-02-23 MED ORDER — HYDRALAZINE HCL 20 MG/ML IJ SOLN
10.0000 mg | Freq: Once | INTRAMUSCULAR | Status: AC
  Administered 2013-02-23: 10 mg via INTRAVENOUS
  Filled 2013-02-23: qty 1

## 2013-02-23 MED ORDER — IOHEXOL 350 MG/ML SOLN
100.0000 mL | Freq: Once | INTRAVENOUS | Status: AC | PRN
  Administered 2013-02-23: 100 mL via INTRAVENOUS

## 2013-02-23 MED ORDER — METOPROLOL TARTRATE 1 MG/ML IV SOLN
5.0000 mg | Freq: Once | INTRAVENOUS | Status: AC
  Administered 2013-02-23: 5 mg via INTRAVENOUS
  Filled 2013-02-23: qty 5

## 2013-02-23 MED ORDER — HYDROCHLOROTHIAZIDE 50 MG PO TABS
50.0000 mg | ORAL_TABLET | Freq: Every day | ORAL | Status: DC
  Administered 2013-02-23: 50 mg via ORAL
  Filled 2013-02-23: qty 1

## 2013-02-23 MED ORDER — LIDOCAINE-EPINEPHRINE 2 %-1:100000 IJ SOLN
20.0000 mL | Freq: Once | INTRAMUSCULAR | Status: AC
  Administered 2013-02-23: 10 mL
  Filled 2013-02-23: qty 20

## 2013-02-23 MED ORDER — DOXYCYCLINE HYCLATE 100 MG PO CAPS: 100.0000 mg | ORAL_CAPSULE | Freq: Two times a day (BID) | ORAL | Status: DC

## 2013-02-23 MED ORDER — AMLODIPINE BESYLATE 10 MG PO TABS: 10.0000 mg | ORAL_TABLET | Freq: Every day | ORAL | Status: DC

## 2013-02-23 MED ORDER — HYDROCHLOROTHIAZIDE 50 MG PO TABS: 25.0000 mg | ORAL_TABLET | Freq: Every day | ORAL | Status: DC

## 2013-02-23 MED ORDER — AMLODIPINE BESYLATE 10 MG PO TABS
10.0000 mg | ORAL_TABLET | Freq: Every day | ORAL | Status: DC
  Administered 2013-02-23: 10 mg via ORAL
  Filled 2013-02-23: qty 1

## 2013-02-23 MED ORDER — LABETALOL HCL 5 MG/ML IV SOLN
20.0000 mg | Freq: Once | INTRAVENOUS | Status: AC
  Administered 2013-02-23: 20 mg via INTRAVENOUS
  Filled 2013-02-23: qty 4

## 2013-02-23 NOTE — ED Provider Notes (Signed)
History    CSN: [id number] Arrival date & time 02/23/13  1622  First MD Initiated Contact with Patient 02/23/13 1713     Chief Complaint  Patient presents with  . Foot Pain   (Consider location/radiation/quality/duration/timing/severity/associated sxs/prior Treatment) HPI Comments: Patient is concerned there is glass in his foot. He reports he was walking barefoot 2 nights ago when he stepped on a broken beer bottle. He feels like there is a piece of glass in the bottom of his foot he removed with tweezers. Denies any weakness, numbness or tingling. She also reports being out of his blood pressure medications for 6 months. Tetanus is up to date. Denies any headache, vision change, chest pain. He said intermittent shortness of breath at times. Denies any leg pain or swelling.   The history is provided by the patient.   Past Medical History  Diagnosis Date  . Hypertension   . Depression    History reviewed. No pertinent past surgical history. History reviewed. No pertinent family history. History  Substance Use Topics  . Smoking status: Current Every Day Smoker  . Smokeless tobacco: Not on file  . Alcohol Use: Yes    Review of Systems  Constitutional: Negative for fever, activity change and appetite change.  HENT: Negative for congestion and rhinorrhea.   Respiratory: Positive for shortness of breath. Negative for cough and chest tightness.   Cardiovascular: Negative for chest pain.  Gastrointestinal: Negative for nausea, vomiting and abdominal pain.  Genitourinary: Negative for dysuria.  Musculoskeletal: Positive for myalgias and arthralgias.  Neurological: Negative for dizziness, weakness and headaches.  A complete 10 system review of systems was obtained and all systems are negative except as noted in the HPI and PMH.     Allergies  Penicillins  Home Medications   Current Outpatient Rx  Name  Route  Sig  Dispense  Refill  . amLODipine (NORVASC) 10 MG tablet    Oral   Take 10 mg by mouth daily.         Marland Kitchen amLODipine (NORVASC) 10 MG tablet   Oral   Take 1 tablet (10 mg total) by mouth daily.   30 tablet   0   . doxycycline (VIBRAMYCIN) 100 MG capsule   Oral   Take 1 capsule (100 mg total) by mouth 2 (two) times daily.   20 capsule   0   . hydrochlorothiazide (HYDRODIURIL) 50 MG tablet   Oral   Take 0.5 tablets (25 mg total) by mouth daily.   30 tablet   0    BP 146/98  Pulse 89  Temp(Src) 98.4 F (36.9 C) (Oral)  Resp 21  SpO2 98% Physical Exam  Constitutional: He is oriented to person, place, and time. He appears well-developed and well-nourished. No distress.  HENT:  Head: Normocephalic and atraumatic.  Mouth/Throat: Oropharynx is clear and moist. No oropharyngeal exudate.  Eyes: Conjunctivae and EOM are normal. Pupils are equal, round, and reactive to light.  Neck: Normal range of motion. Neck supple.  Cardiovascular: Normal rate, regular rhythm and normal heart sounds.   No murmur heard. Pulmonary/Chest: Effort normal and breath sounds normal. No respiratory distress.  Abdominal: Soft. There is no tenderness. There is no rebound and no guarding.  Musculoskeletal: Normal range of motion. He exhibits no edema and no tenderness.  R foot with superificial linear laceration. This is a bit deeper at the medial aspect. There is no palpable foreign body. There is no bleeding. +2 DP and PT pulses. Able  to wiggle toes, ankle flexion-extension intact  Neurological: He is alert and oriented to person, place, and time. No cranial nerve deficit. He exhibits normal muscle tone. Coordination normal.  Skin: Skin is warm.    ED Course  FOREIGN BODY REMOVAL Date/Time: 02/23/2013 6:19 PM Performed by: Glynn Octave Authorized by: Glynn Octave Consent: Verbal consent obtained. Risks and benefits: risks, benefits and alternatives were discussed Consent given by: patient Patient understanding: patient states understanding of the  procedure being performed Patient identity confirmed: verbally with patient and provided demographic data Time out: Immediately prior to procedure a "time out" was called to verify the correct patient, procedure, equipment, support staff and site/side marked as required. Body area: skin General location: lower extremity Location details: right foot Anesthesia: local infiltration Local anesthetic: lidocaine 2% with epinephrine Anesthetic total: 8 ml Patient sedated: no Patient restrained: no Patient cooperative: yes Localization method: probed and visualized Removal mechanism: hemostat Dressing: antibiotic ointment and dressing applied Tendon involvement: superficial and none Depth: subcutaneous Complexity: simple 1 objects recovered. Objects recovered: glass Post-procedure assessment: foreign body removed Patient tolerance: Patient tolerated the procedure well with no immediate complications.   (including critical care time) Labs Reviewed  CBC WITH DIFFERENTIAL - Abnormal; Notable for the following:    RBC 6.48 (*)    MCV 75.2 (*)    MCH 25.8 (*)    All other components within normal limits  BASIC METABOLIC PANEL - Abnormal; Notable for the following:    Glucose, Bld 100 (*)    GFR calc non Af Amer 80 (*)    All other components within normal limits  URINALYSIS, ROUTINE W REFLEX MICROSCOPIC - Abnormal; Notable for the following:    Color, Urine AMBER (*)    APPearance CLOUDY (*)    Specific Gravity, Urine 1.042 (*)    Hgb urine dipstick MODERATE (*)    Bilirubin Urine MODERATE (*)    Ketones, ur 40 (*)    Protein, ur >300 (*)    Nitrite POSITIVE (*)    Leukocytes, UA SMALL (*)    All other components within normal limits  D-DIMER, QUANTITATIVE - Abnormal; Notable for the following:    D-Dimer, Quant 1.03 (*)    All other components within normal limits  URINE MICROSCOPIC-ADD ON - Abnormal; Notable for the following:    Bacteria, UA FEW (*)    Casts HYALINE CASTS (*)     All other components within normal limits  URINE CULTURE  TROPONIN I   Ct Head Wo Contrast  02/23/2013   *RADIOLOGY REPORT*  Clinical Data: Headache.  CT HEAD WITHOUT CONTRAST  Technique:  Contiguous axial images were obtained from the base of the skull through the vertex without contrast.  Comparison: Head CT scan 08/15/2012.  Findings: The brain appears normal without infarct, hemorrhage, mass lesion, mass effect, midline shift or abnormal extra-axial fluid collection.  No hydrocephalus or pneumocephalus.  The calvarium is intact.  Large expansile low attenuating lesion in the right maxillary sinus is partially visualized.  Mild ethmoid air cell disease is noted.  IMPRESSION:  1.  No acute abnormality. 2.  Large low attenuating lesion in the right maxillary sinus is partially visualized but likely reflects a mucocele.   Original Report Authenticated By: Holley Dexter, M.D.   Dg Foot Complete Right  02/23/2013   *RADIOLOGY REPORT*  Clinical Data: The patient stepped on a broken piece of glass 2 days ago.  Laceration at the proximal fourth and fifth metatarsals.  RIGHT FOOT COMPLETE -  3+ VIEW  Comparison: None.  Findings: No fracture, dislocation or radiopaque foreign body is identified.  IMPRESSION: Negative for foreign body or fracture.   Original Report Authenticated By: Holley Dexter, M.D.   Ct Angio Chest Aortic Dissect W &/or W/o  02/23/2013   *RADIOLOGY REPORT*  Clinical Data:  Elevated blood pressure with chest and abdominal pain.  CT ANGIOGRAPHY CHEST, ABDOMEN AND PELVIS  Technique:  Multidetector CT imaging through the chest, abdomen and pelvis was performed using the standard protocol during bolus administration of intravenous contrast.  Multiplanar reconstructed images including MIPs were obtained and reviewed to evaluate the vascular anatomy.  Contrast: OMNIPAQUE IOHEXOL 350 MG/ML SOLN  Comparison:  08/15/2012  CTA CHEST  Findings:  The chest wall is unremarkable.  No masses or  adenopathy.  There are small scattered axillary and supraclavicular lymph nodes.  The thyroid gland is grossly normal.  The bony thorax is intact.  No destructive bone lesions or spinal canal compromise. Moderate degenerative changes are noted throughout the thoracic spine.  The heart is normal in size.  No pericardial effusion.  No mediastinal or hilar mass or adenopathy.  Small scattered lymph nodes are noted.  The aorta is normal in caliber.  No dissection.  The branch vessels are patent.  No coronary artery calcifications are identified.  The pulmonary arterial tree is fairly well opacified.  No definite filling defects to suggest pulmonary emboli.  The esophagus is grossly normal.  Examination of the lung parenchyma demonstrates no acute pulmonary findings.  No pulmonary lesions.  No pleural effusion.   Review of the MIP images confirms the above findings.  IMPRESSION:  1.  Normal CT appearance of the thoracic aorta.  No aneurysm or dissection. 2.  Aortic branch vessels are patent.  No coronary artery calcifications. 3.  Normal pulmonary arteries. 4.  No acute pulmonary findings. 5.  Advanced degenerative changes in the thoracic and lumbar spine for age.  CTA ABDOMEN AND PELVIS  Findings:  The abdominal aorta is normal in caliber.  No dissection.  The branch vessels are patent.  Minimal atherosclerotic calcifications involving the iliac arteries but no aneurysm or dissection.  The solid abdominal organs are grossly normal.  The gallbladder is normal.  No common bile duct dilatation.  The stomach, duodenum, small bowel and colon are grossly normal without oral contrast.  No mesenteric or retroperitoneal mass or adenopathy.  The bladder, prostate gland and seminal vesicles are unremarkable. No pelvic mass, adenopathy or free pelvic fluid collections.  No inguinal mass or hernia.  The bony pelvis is intact.  Mild hip, pubic symphysis and SI joint degenerative changes.  Moderate degenerative changes involving the  lumbar spine.   Review of the MIP images confirms the above findings.  IMPRESSION:  1.  Unremarkable CT examination of the abdominal aorta and branch vessels.  No aortic aneurysm or dissection. 2.  No acute abdominal/pelvic findings, mass lesions or adenopathy.   Original Report Authenticated By: Rudie Meyer, M.D.   Ct Cta Abd/pel W/cm &/or W/o Cm  02/23/2013   *RADIOLOGY REPORT*  Clinical Data:  Elevated blood pressure with chest and abdominal pain.  CT ANGIOGRAPHY CHEST, ABDOMEN AND PELVIS  Technique:  Multidetector CT imaging through the chest, abdomen and pelvis was performed using the standard protocol during bolus administration of intravenous contrast.  Multiplanar reconstructed images including MIPs were obtained and reviewed to evaluate the vascular anatomy.  Contrast: OMNIPAQUE IOHEXOL 350 MG/ML SOLN  Comparison:  08/15/2012  CTA CHEST  Findings:  The chest wall is unremarkable.  No masses or adenopathy.  There are small scattered axillary and supraclavicular lymph nodes.  The thyroid gland is grossly normal.  The bony thorax is intact.  No destructive bone lesions or spinal canal compromise. Moderate degenerative changes are noted throughout the thoracic spine.  The heart is normal in size.  No pericardial effusion.  No mediastinal or hilar mass or adenopathy.  Small scattered lymph nodes are noted.  The aorta is normal in caliber.  No dissection.  The branch vessels are patent.  No coronary artery calcifications are identified.  The pulmonary arterial tree is fairly well opacified.  No definite filling defects to suggest pulmonary emboli.  The esophagus is grossly normal.  Examination of the lung parenchyma demonstrates no acute pulmonary findings.  No pulmonary lesions.  No pleural effusion.   Review of the MIP images confirms the above findings.  IMPRESSION:  1.  Normal CT appearance of the thoracic aorta.  No aneurysm or dissection. 2.  Aortic branch vessels are patent.  No coronary artery  calcifications. 3.  Normal pulmonary arteries. 4.  No acute pulmonary findings. 5.  Advanced degenerative changes in the thoracic and lumbar spine for age.  CTA ABDOMEN AND PELVIS  Findings:  The abdominal aorta is normal in caliber.  No dissection.  The branch vessels are patent.  Minimal atherosclerotic calcifications involving the iliac arteries but no aneurysm or dissection.  The solid abdominal organs are grossly normal.  The gallbladder is normal.  No common bile duct dilatation.  The stomach, duodenum, small bowel and colon are grossly normal without oral contrast.  No mesenteric or retroperitoneal mass or adenopathy.  The bladder, prostate gland and seminal vesicles are unremarkable. No pelvic mass, adenopathy or free pelvic fluid collections.  No inguinal mass or hernia.  The bony pelvis is intact.  Mild hip, pubic symphysis and SI joint degenerative changes.  Moderate degenerative changes involving the lumbar spine.   Review of the MIP images confirms the above findings.  IMPRESSION:  1.  Unremarkable CT examination of the abdominal aorta and branch vessels.  No aortic aneurysm or dissection. 2.  No acute abdominal/pelvic findings, mass lesions or adenopathy.   Original Report Authenticated By: Rudie Meyer, M.D.   1. Foreign body in foot, right, initial encounter   2. Hypertension   3. Urinary tract infection     MDM  Foreign body sensation in her right foot after stepping on broken glass. Blood pressure medications for 6 months. No current headache, chest pain. Has had recurrent shortness of breath intermittently for several months.  Glass removed from foot as above. No evidence of fracture. Tetanus up-to-date. Patient's blood pressure is uncontrolled he states has been out of medicines for 6 months.  Is given his medications and IV hydralazine in ED. Given his unequal upper extremity blood pressures, CTA was performed to rule out dissection.  This was negative.  Blood pressure improved to  140/92 after treatment. Discussed with patient importance of compliance with blood pressure medication. His foot wound was thoroughly irrigated and will be left open. He is placed on prophylactic antibiotics. This should cover his possible UTI as well. he is given a referral to primary care.   Date: 02/23/2013  Rate: 85  Rhythm: normal sinus rhythm  QRS Axis: normal  Intervals: normal  ST/T Wave abnormalities: normal  Conduction Disutrbances:none  Narrative Interpretation:   Old EKG Reviewed: none available    Glynn Octave, MD 02/23/13 2355

## 2013-02-23 NOTE — ED Notes (Signed)
Pt reports stepping on a piece of glass Sunday night, unable to remove the piece of glass at home on his own, pt is unable to put full weight on his Right foot. Pt's BP was high in triage, pt reports he has not taken his HTN medication since January, states he was going to [employer] and has not been able to find another pcp since.

## 2013-02-25 LAB — URINE CULTURE: Culture: NO GROWTH

## 2013-04-28 ENCOUNTER — Encounter: Payer: Self-pay | Admitting: Internal Medicine

## 2013-04-28 ENCOUNTER — Ambulatory Visit: Payer: Self-pay | Attending: Internal Medicine | Admitting: Internal Medicine

## 2013-04-28 ENCOUNTER — Ambulatory Visit (HOSPITAL_COMMUNITY)
Admission: RE | Admit: 2013-04-28 | Discharge: 2013-04-28 | Disposition: A | Discharge status: Home / Self Care | Payer: Self-pay | Source: Ambulatory Visit | Attending: Internal Medicine | Admitting: Internal Medicine

## 2013-04-28 VITALS — BP 173/126 | HR 73 | Temp 99.1°F | Resp 16 | Ht 70.47 in | Wt 298.0 lb

## 2013-04-28 DIAGNOSIS — I10 Essential (primary) hypertension: Secondary | ICD-10-CM | POA: Insufficient documentation

## 2013-04-28 DIAGNOSIS — G4733 Obstructive sleep apnea (adult) (pediatric): Secondary | ICD-10-CM | POA: Insufficient documentation

## 2013-04-28 DIAGNOSIS — R634 Abnormal weight loss: Secondary | ICD-10-CM | POA: Insufficient documentation

## 2013-04-28 DIAGNOSIS — R5381 Other malaise: Secondary | ICD-10-CM | POA: Insufficient documentation

## 2013-04-28 LAB — CMP AND LIVER
AST: 31 U/L (ref 0–37)
Albumin: 3.8 g/dL (ref 3.5–5.2)
BUN: 15 mg/dL (ref 6–23)
Calcium: 9.1 mg/dL (ref 8.4–10.5)
Chloride: 105 mEq/L (ref 96–112)
Glucose, Bld: 85 mg/dL (ref 70–99)
Indirect Bilirubin: 0.4 mg/dL (ref 0.0–0.9)
Potassium: 3.8 mEq/L (ref 3.5–5.3)

## 2013-04-28 LAB — LIPID PANEL
HDL: 51 mg/dL (ref >39)
LDL Cholesterol: 103 mg/dL — ABNORMAL HIGH (ref 0–99)
Total CHOL/HDL Ratio: 3.6 Ratio
Triglycerides: 141 mg/dL (ref <150)

## 2013-04-28 MED ORDER — LISINOPRIL-HYDROCHLOROTHIAZIDE 20-25 MG PO TABS: 1.0000 | ORAL_TABLET | Freq: Every day | ORAL | Status: DC

## 2013-04-28 MED ORDER — CLONIDINE HCL 0.1 MG PO TABS
0.2000 mg | ORAL_TABLET | Freq: Once | ORAL | Status: AC
  Administered 2013-04-28: 0.2 mg via ORAL

## 2013-04-28 NOTE — Progress Notes (Signed)
Pt is here to establish care. Pt has hypertension BP today is 173/113mmhg Since January 2014 pt is having rapid weight loss, 5 pounds a week He also states that he is having inconstant stool.

## 2013-04-28 NOTE — Progress Notes (Signed)
Patient ID: [name] Donovan, male   DOB: 24-Feb-1971, 42 y.o.   MRN: [id number]  CC: To establish care  HPI: Patient is a 42 year old gentleman who presented to our clinic today to establish medical care. His major concerns are: Weight loss unintentionally for the past 6 months, he said he has lost almost 70 pounds unintentionally, he is also having diarrhea about 6 times a day watery, no unusual odor, no blood, no associated vomiting no abdominal pain. Patient also noticed that he's been tired more lately, and easily sleepy. He confessed to snoring loudly with episodes of apnea during sleep, he has been through several time by witnesses that he has apnea during sleeping. He was diagnosed with hypertension long time ago and was supposed to be on medication but he has not been taking them. He last time he went to the ED, his blood pressure was very high and he was started on Norvasc 10 mg tablet daily, he took that for a while and then stopped again. Today he denies chest pain, no headache, no palpitation, no abdominal pain, no shortness of breath.  Looking at his previous records he also has protein in his urine, he told me he was informed in the ER that his hypertension may be causing his kidneys to leak protein in urine. No leg swelling. Allergies  Allergen Reactions  . Penicillins Hives, Shortness Of Breath and Rash    dizziness   Past Medical History  Diagnosis Date  . Hypertension   . Depression    Current Outpatient Prescriptions on File Prior to Visit  Medication Sig Dispense Refill  . amLODipine (NORVASC) 10 MG tablet Take 10 mg by mouth daily.      Marland Kitchen amLODipine (NORVASC) 10 MG tablet Take 1 tablet (10 mg total) by mouth daily.  30 tablet  0  . doxycycline (VIBRAMYCIN) 100 MG capsule Take 1 capsule (100 mg total) by mouth 2 (two) times daily.  20 capsule  0  . hydrochlorothiazide (HYDRODIURIL) 50 MG tablet Take 0.5 tablets (25 mg total) by mouth daily.  30 tablet  0   No current  facility-administered medications on file prior to visit.   Family History  Problem Relation Age of Onset  . Hypertension Mother   . Heart disease Father   . Heart disease Brother    History   Social History  . Marital Status: Single    Spouse Name: N/A    Number of Children: N/A  . Years of Education: N/A   Occupational History  . Not on file.   Social History Main Topics  . Smoking status: Current Every Day Smoker    Types: Cigarettes  . Smokeless tobacco: Not on file  . Alcohol Use: Yes  . Drug Use: 1.00 per week    Special: Marijuana  . Sexual Activity: Not on file   Other Topics Concern  . Not on file   Social History Narrative  . No narrative on file    Review of Systems: Constitutional: Negative for fever, chills, diaphoresis, activity change, appetite change and fatigue. HENT: Negative for ear pain, nosebleeds, congestion, facial swelling, rhinorrhea, neck pain, neck stiffness and ear discharge.  Eyes: Negative for pain, discharge, redness, itching and visual disturbance. Respiratory: Negative for cough, choking, chest tightness, shortness of breath, wheezing and stridor.  Cardiovascular: Negative for chest pain, palpitations and leg swelling. Gastrointestinal: Negative for abdominal distention. Genitourinary: Negative for dysuria, urgency, frequency, hematuria, flank pain, decreased urine volume, difficulty urinating and dyspareunia.  Musculoskeletal:  Negative for back pain, joint swelling, arthralgias and gait problem. Neurological: Negative for dizziness, tremors, seizures, syncope, facial asymmetry, speech difficulty, weakness, light-headedness, numbness and headaches.  Hematological: Negative for adenopathy. Does not bruise/bleed easily. Psychiatric/Behavioral: Negative for hallucinations, behavioral problems, confusion, dysphoric mood, decreased concentration and agitation.    Objective:   Filed Vitals:   04/28/13 1418  BP: 173/126  Pulse: 73   Temp: 99.1 F (37.3 C)  Resp: 16    Physical Exam: Constitutional: Patient appears well-developed and well-nourished. No distress. Morbidly obese HENT: Normocephalic, atraumatic, External right and left ear normal. Oropharynx is clear and moist.  Eyes: Conjunctivae and EOM are normal. PERRLA, no scleral icterus. Neck: Normal ROM. Neck supple. No JVD. No tracheal deviation. No thyromegaly. CVS: RRR, S1/S2 +, no murmurs, no gallops, no carotid bruit.  Pulmonary: Effort and breath sounds normal, no stridor, rhonchi, wheezes, rales.  Abdominal: Soft. BS +,  no distension, tenderness, rebound or guarding.  Musculoskeletal: Normal range of motion. No edema and no tenderness.  Lymphadenopathy: No lymphadenopathy noted, cervical, inguinal or axillary Neuro: Alert. Normal reflexes, muscle tone coordination. No cranial nerve deficit. Skin: Skin is warm and dry. No rash noted. Not diaphoretic. No erythema. No pallor. Psychiatric: Normal mood and affect. Behavior, judgment, thought content normal.  Lab Results  Component Value Date   WBC 6.5 02/23/2013   HGB 16.7 02/23/2013   HCT 48.7 02/23/2013   MCV 75.2* 02/23/2013   PLT 200 02/23/2013   Lab Results  Component Value Date   CREATININE 1.12 02/23/2013   BUN 8 02/23/2013   NA 140 02/23/2013   K 3.9 02/23/2013   CL 102 02/23/2013   CO2 28 02/23/2013    No results found for this basename: HGBA1C   Lipid Panel     Component Value Date/Time   CHOL  Value: 216        ATP III CLASSIFICATION:  <200     mg/dL   Desirable  295-621  mg/dL   Borderline High  >=308    mg/dL   High* 6/57/8469 6295   TRIG 139 03/15/2008 0645   HDL 33* 03/15/2008 0645   CHOLHDL 6.5 03/15/2008 0645   VLDL 28 03/15/2008 0645   LDLCALC  Value: 155        Total Cholesterol/HDL:CHD Risk Coronary Heart Disease Risk Table                     Men   Women  1/2 Average Risk   3.4   3.3* 03/15/2008 0645       Assessment and plan:   Patient Active Problem List   Diagnosis Date Noted  .  Accelerated hypertension 04/28/2013  . Obstructive sleep apnea 04/28/2013  . Loss of weight 04/28/2013  . Morbid obesity 04/28/2013  . OSA (obstructive sleep apnea) 04/28/2013   Patient will be worked up completely for unintentional weight loss and prolonged diarrhea  CBC D. CMP Hepatitis panel HIV Hemoglobin A1c Chest x-ray Lipid panel Thyroid function test Complete urinalysis Stool tests: Ova and parasite, C. Difficile  Give clonidine 0.2 mg tablet by mouth now for accelerated hypertension  Stat Lisinopril-hydrochlorothiazide 20-25 mg tablet by mouth daily Patient extensively counseled about blood pressure control We discussed the goal of blood pressure and the need for medication compliance Complications of hypertension stat been discussed the patient Extensive counseling on smoking cessation, reduce alcohol ingestion Patient verbalized understanding  Will not start patient on any medication for diarrhea now until we found  the results of labs  Patient was seen during this visit with her medical doctor sister.  [name] Donovan was given clear instructions to go to ER or return to the clinic if symptoms don't improve, worsen or new problems develop.  [name] Donovan verbalized understanding.  [name] Donovan was told to call to get lab results if hasn't heard anything in the next week.       Jeanann Lewandowsky, MD [facility] [place], [place] [phone]   04/28/2013, 2:53 PM

## 2013-04-29 ENCOUNTER — Telehealth: Payer: Self-pay | Admitting: Emergency Medicine

## 2013-04-29 LAB — CBC WITH DIFFERENTIAL/PLATELET
Basophils Absolute: 0 10*3/uL (ref 0.0–0.1)
Basophils Relative: 0 % (ref 0–1)
Eosinophils Relative: 2 % (ref 0–5)
Lymphocytes Relative: 22 % (ref 12–46)
MCHC: 32.2 g/dL (ref 30.0–36.0)
MCV: 76.9 fL — ABNORMAL LOW (ref 78.0–100.0)
Platelets: 255 10*3/uL (ref 150–400)
RDW: 16.9 % — ABNORMAL HIGH (ref 11.5–15.5)
WBC: 9.4 10*3/uL (ref 4.0–10.5)

## 2013-04-29 LAB — URINALYSIS, COMPLETE
Bacteria, UA: NONE SEEN
Ketones, ur: NEGATIVE mg/dL
Nitrite: NEGATIVE
Protein, ur: >300 mg/dL — AB
Specific Gravity, Urine: >1.03 — ABNORMAL HIGH (ref 1.005–1.030)
Urobilinogen, UA: 0.2 mg/dL (ref 0.0–1.0)

## 2013-04-29 LAB — ACUTE HEP PANEL AND HEP B SURFACE AB
Hep B C IgM: NEGATIVE
Hep B S Ab: NEGATIVE
Hepatitis B Surface Ag: NEGATIVE

## 2013-04-29 LAB — HIV ANTIBODY (ROUTINE TESTING W REFLEX): HIV: NONREACTIVE

## 2013-04-29 LAB — CLOSTRIDIUM DIFFICILE EIA: CDIFTX: NEGATIVE

## 2013-04-29 NOTE — Telephone Encounter (Signed)
SPOKE WITH PT SISTER TAMIKA LISTED AS CONTACT RESULTS GIVEN

## 2013-04-29 NOTE — Telephone Encounter (Signed)
Message copied by Darlis Loan on Fri Apr 29, 2013 12:18 PM ------      Message from: Quentin Angst      Created: Fri Apr 29, 2013  9:36 AM       Please call to inform patient that his chest x-ray is normal, Clostridium difficile is negative in the stool, and hemoglobin A1c shows that he's not at risk for diabetes at this time. We are awaiting other lab results and will inform patient as soon as they come in. ------

## 2013-05-10 ENCOUNTER — Telehealth: Payer: Self-pay | Admitting: Emergency Medicine

## 2013-05-10 NOTE — Telephone Encounter (Signed)
PT RETURNED CALL TODAY. INFORMED PT THAT ALL LAB WORK  NEGATIVE.

## 2013-05-10 NOTE — Telephone Encounter (Signed)
Left message on pt voicemail for pt to call for remaining lab work.

## 2013-05-11 ENCOUNTER — Ambulatory Visit: Payer: Self-pay

## 2013-06-03 ENCOUNTER — Ambulatory Visit (HOSPITAL_BASED_OUTPATIENT_CLINIC_OR_DEPARTMENT_OTHER): Payer: Self-pay | Attending: Internal Medicine

## 2013-06-03 ENCOUNTER — Ambulatory Visit: Payer: Self-pay | Attending: Internal Medicine | Admitting: Internal Medicine

## 2013-06-03 VITALS — BP 146/92 | HR 93 | Temp 99.3°F | Resp 16 | Wt 298.0 lb

## 2013-06-03 DIAGNOSIS — I10 Essential (primary) hypertension: Secondary | ICD-10-CM

## 2013-06-03 NOTE — Progress Notes (Signed)
Patient ID: [name] Donovan, male   DOB: 01/09/1971, 42 y.o.   MRN: [id number]   CC: follow up blood work  Please note that patient came in to discuss results of the blood tests. We have discussed electrolyte panel, A1c, CBC results which are all stable and within normal limits. Patient feels well today and denies chest pain or shortness of breath, no abdominal or urinary concerns. Please note there would be no charge for today's visit.

## 2013-06-03 NOTE — Progress Notes (Signed)
Patient here for follow up _HTN 

## 2013-06-03 NOTE — Patient Instructions (Signed)

## 2013-06-15 ENCOUNTER — Ambulatory Visit: Payer: Self-pay

## 2013-06-15 ENCOUNTER — Telehealth: Payer: Self-pay | Admitting: Internal Medicine

## 2013-06-15 NOTE — Telephone Encounter (Signed)
Pt Did Not Show  to his sleep study 06-03-13

## 2013-09-10 ENCOUNTER — Encounter (HOSPITAL_COMMUNITY): Payer: Self-pay | Admitting: Emergency Medicine

## 2013-09-10 ENCOUNTER — Emergency Department (HOSPITAL_COMMUNITY)
Admission: EM | Admit: 2013-09-10 | Discharge: 2013-09-10 | Disposition: A | Discharge status: Home / Self Care | Payer: Self-pay | Attending: Emergency Medicine | Admitting: Emergency Medicine

## 2013-09-10 DIAGNOSIS — Z880 Allergy status to penicillin: Secondary | ICD-10-CM | POA: Insufficient documentation

## 2013-09-10 DIAGNOSIS — G4733 Obstructive sleep apnea (adult) (pediatric): Secondary | ICD-10-CM | POA: Insufficient documentation

## 2013-09-10 DIAGNOSIS — F17200 Nicotine dependence, unspecified, uncomplicated: Secondary | ICD-10-CM | POA: Insufficient documentation

## 2013-09-10 DIAGNOSIS — F1010 Alcohol abuse, uncomplicated: Secondary | ICD-10-CM | POA: Insufficient documentation

## 2013-09-10 DIAGNOSIS — F10929 Alcohol use, unspecified with intoxication, unspecified: Secondary | ICD-10-CM

## 2013-09-10 DIAGNOSIS — Z79899 Other long term (current) drug therapy: Secondary | ICD-10-CM | POA: Insufficient documentation

## 2013-09-10 DIAGNOSIS — E669 Obesity, unspecified: Secondary | ICD-10-CM | POA: Insufficient documentation

## 2013-09-10 DIAGNOSIS — Z8659 Personal history of other mental and behavioral disorders: Secondary | ICD-10-CM | POA: Insufficient documentation

## 2013-09-10 DIAGNOSIS — I10 Essential (primary) hypertension: Secondary | ICD-10-CM | POA: Insufficient documentation

## 2013-09-10 LAB — GLUCOSE, CAPILLARY: Glucose-Capillary: 96 mg/dL (ref 70–99)

## 2013-09-10 NOTE — ED Notes (Signed)
[facility]; EMS was called due to "patient is to drunk for the drunk tank"; per jail pt was unstable in ambulation; pt has superficial scratches to his back - unknown how patient obtained these scratches; pt uncooperative and combative upon arrival.

## 2013-09-10 NOTE — ED Notes (Signed)
Bed: [id number] Expected date:  Expected time:  Means of arrival:  Comments: EMS ETOH, "too drunk for jail"

## 2013-09-10 NOTE — Discharge Instructions (Signed)
Take blood pressure medicines regularly. Return for any new symptoms.  Avoid alcohol use.  If you were given medicines take as directed.  If you are on coumadin or contraceptives realize their levels and effectiveness is altered by many different medicines.  If you have any reaction (rash, tongues swelling, other) to the medicines stop taking and see a physician.   Please follow up as directed and return to the ER or see a physician for new or worsening symptoms.  Thank you.  Alcohol Intoxication Alcohol intoxication occurs when the amount of alcohol that a person has consumed impairs his or her ability to mentally and physically function. Alcohol directly impairs the normal chemical activity of the brain. Drinking large amounts of alcohol can lead to changes in mental function and behavior, and it can cause many physical effects that can be harmful.  Alcohol intoxication can range in severity from mild to very severe. Various factors can affect the level of intoxication that occurs, such as the person's age, gender, weight, frequency of alcohol consumption, and the presence of other medical conditions (such as diabetes, seizures, or heart conditions). Dangerous levels of alcohol intoxication may occur when people drink large amounts of alcohol in a short period (binge drinking). Alcohol can also be especially dangerous when combined with certain prescription medicines or "recreational" drugs. SIGNS AND SYMPTOMS Some common signs and symptoms of mild alcohol intoxication include:  Loss of coordination.  Changes in mood and behavior.  Impaired judgment.  Slurred speech. As alcohol intoxication progresses to more severe levels, other signs and symptoms will appear. These may include:  Vomiting.  Confusion and impaired memory.  Slowed breathing.  Seizures.  Loss of consciousness. DIAGNOSIS  Your health care provider will take a medical history and perform a physical exam. You will be  asked about the amount and type of alcohol you have consumed. Blood tests will be done to measure the concentration of alcohol in your blood. In many places, your blood alcohol level must be lower than 80 mg/dL (4.54%) to legally drive. However, many dangerous effects of alcohol can occur at much lower levels.  TREATMENT  People with alcohol intoxication often do not require treatment. Most of the effects of alcohol intoxication are temporary, and they go away as the alcohol naturally leaves the body. Your health care provider will monitor your condition until you are stable enough to go home. Fluids are sometimes given through an IV access tube to help prevent dehydration.  HOME CARE INSTRUCTIONS  Do not drive after drinking alcohol.  Stay hydrated. Drink enough water and fluids to keep your urine clear or pale yellow. Avoid caffeine.   Only take over-the-counter or prescription medicines as directed by your health care provider.  SEEK MEDICAL CARE IF:   You have persistent vomiting.   You do not feel better after a few days.  You have frequent alcohol intoxication. Your health care provider can help determine if you should see a substance use treatment counselor. SEEK IMMEDIATE MEDICAL CARE IF:   You become shaky or tremble when you try to stop drinking.   You shake uncontrollably (seizure).   You throw up (vomit) blood. This may be bright red or may look like black coffee grounds.   You have blood in your stool. This may be bright red or may appear as a black, tarry, bad smelling stool.   You become lightheaded or faint.  MAKE SURE YOU:   Understand these instructions.  Will watch your condition.  Will get help right away if you are not doing well or get worse. Document Released: 05/21/2005 Document Revised: 04/13/2013 Document Reviewed: 01/14/2013 [facility] Patient Information 2014 [place], [place].

## 2013-09-10 NOTE — ED Provider Notes (Signed)
CSN: [id number]     Arrival date & time 09/10/13  0142 History   First MD Initiated Contact with Patient 09/10/13 0203     Chief Complaint  Patient presents with  . Alcohol Intoxication   (Consider location/radiation/quality/duration/timing/severity/associated sxs/prior Treatment) HPI Comments: 43 yo male with obesity, sleep apnea, HTN presents with police after being picked up at his home for being drunk/ bothering people.  Sent to ED because difficulty with gait initially and too intoxicated.  Since arrival Police/ nurse/ myself have noticed significant improvement.  Superficial scratches on back, pt does not recall cause.  Pt denies all sxs, admits to drinking heavy alcohol tonight, hx of etoh abuse. No other drugs. No injuries, HA or fevers.   Patient is a 43 y.o. male presenting with intoxication. The history is provided by the patient and the police.  Alcohol Intoxication This is a recurrent problem. Pertinent negatives include no chest pain, no abdominal pain, no headaches and no shortness of breath.    Past Medical History  Diagnosis Date  . Hypertension   . Depression    History reviewed. No pertinent past surgical history. Family History  Problem Relation Age of Onset  . Hypertension Mother   . Heart disease Father   . Heart disease Brother    History  Substance Use Topics  . Smoking status: Current Every Day Smoker    Types: Cigarettes  . Smokeless tobacco: Not on file  . Alcohol Use: Yes    Review of Systems  Constitutional: Negative for fever and chills.  HENT: Negative for congestion.   Eyes: Negative for visual disturbance.  Respiratory: Negative for shortness of breath.   Cardiovascular: Negative for chest pain.  Gastrointestinal: Negative for vomiting and abdominal pain.  Genitourinary: Negative for dysuria and flank pain.  Musculoskeletal: Negative for back pain, neck pain and neck stiffness.  Skin: Positive for wound. Negative for rash.  Neurological:  Negative for light-headedness and headaches.    Allergies  Penicillins  Home Medications   Current Outpatient Rx  Name  Route  Sig  Dispense  Refill  . amLODipine (NORVASC) 10 MG tablet   Oral   Take 1 tablet (10 mg total) by mouth daily.   30 tablet   0   . hydrochlorothiazide (HYDRODIURIL) 50 MG tablet   Oral   Take 0.5 tablets (25 mg total) by mouth daily.   30 tablet   0   . lisinopril-hydrochlorothiazide (PRINZIDE,ZESTORETIC) 20-25 MG per tablet   Oral   Take 1 tablet by mouth daily.   90 tablet   3    BP 161/109  Pulse 78  Temp(Src) 98.7 F (37.1 C) (Oral)  Resp 18  SpO2 97% Physical Exam  Nursing note and vitals reviewed. Constitutional: He is oriented to person, place, and time. He appears well-developed and well-nourished.  HENT:  Head: Normocephalic and atraumatic.  Eyes: Conjunctivae are normal. Right eye exhibits no discharge. Left eye exhibits no discharge.  Neck: Normal range of motion. Neck supple. No tracheal deviation present.  Cardiovascular: Normal rate and regular rhythm.   Pulmonary/Chest: Effort normal and breath sounds normal.  Abdominal: Soft. He exhibits no distension. There is no tenderness. There is no guarding.  Musculoskeletal: He exhibits no edema.  Neurological: He is alert and oriented to person, place, and time. Coordination and gait normal. GCS eye subscore is 4. GCS verbal subscore is 5. GCS motor subscore is 6.  5+ strength in UE and LE with f/e at major joints. Sensation  to palpation intact in UE and LE. CNs 2-12 grossly intact.  EOMFI.  PERRL.   Finger nose and coordination intact bilateral.   Visual fields intact to finger testing. Mild clinical intox Pt knows name, location , dob, address  Skin: Skin is warm.  Few long superficial scratches to lower back, no warmth/ bleeding or erythema.  No midline back pain.   Psychiatric:  Mild clinical intox    ED Course  Procedures (including critical care time) Labs  Review Labs Reviewed - No data to display Imaging Review No results found.  EKG Interpretation   None       MDM   1. Alcohol intoxication   2. HTN (hypertension)    Mild clinical intox, pt has improved significantly per police since they picked him up. No signs of injury or other drugs/ concerns at scene. Observed in ED, po fluids. Normal gait.   At this time pt minimal clinical intox. Plan for police to resume care of patient, strict return instructions given. HTN pt has chronic htn and with etoh hx.  No cp, sob or clinical signs of end organ damage. Fup outpt.   Results and differential diagnosis were discussed with the patient. Close follow up outpatient was discussed, patient comfortable with the plan.   Diagnosis: etoh abuse, htn    Enid SkeensJoshua M Rumaldo Difatta, MD 09/10/13 [phone]

## 2014-04-22 ENCOUNTER — Emergency Department (HOSPITAL_COMMUNITY)
Admission: EM | Admit: 2014-04-22 | Discharge: 2014-04-22 | Disposition: A | Discharge status: Other Acute Inpatient Hospital | Payer: Self-pay | Attending: Emergency Medicine | Admitting: Emergency Medicine

## 2014-04-22 ENCOUNTER — Encounter (HOSPITAL_COMMUNITY): Payer: Self-pay | Admitting: *Deleted

## 2014-04-22 ENCOUNTER — Encounter (HOSPITAL_COMMUNITY): Payer: Self-pay | Admitting: Emergency Medicine

## 2014-04-22 ENCOUNTER — Inpatient Hospital Stay (HOSPITAL_COMMUNITY)
Admission: AD | Admit: 2014-04-22 | Discharge: 2014-04-27 | DRG: 897 | Disposition: A | Discharge status: Home / Self Care | Payer: Federal, State, Local not specified - Other | Source: Intra-hospital | Attending: Psychiatry | Admitting: Psychiatry

## 2014-04-22 DIAGNOSIS — I10 Essential (primary) hypertension: Secondary | ICD-10-CM | POA: Insufficient documentation

## 2014-04-22 DIAGNOSIS — F1994 Other psychoactive substance use, unspecified with psychoactive substance-induced mood disorder: Secondary | ICD-10-CM | POA: Diagnosis present

## 2014-04-22 DIAGNOSIS — G4700 Insomnia, unspecified: Secondary | ICD-10-CM | POA: Diagnosis present

## 2014-04-22 DIAGNOSIS — Z8249 Family history of ischemic heart disease and other diseases of the circulatory system: Secondary | ICD-10-CM

## 2014-04-22 DIAGNOSIS — F329 Major depressive disorder, single episode, unspecified: Secondary | ICD-10-CM | POA: Diagnosis present

## 2014-04-22 DIAGNOSIS — G4710 Hypersomnia, unspecified: Secondary | ICD-10-CM | POA: Diagnosis present

## 2014-04-22 DIAGNOSIS — F17200 Nicotine dependence, unspecified, uncomplicated: Secondary | ICD-10-CM | POA: Diagnosis present

## 2014-04-22 DIAGNOSIS — R45851 Suicidal ideations: Secondary | ICD-10-CM

## 2014-04-22 DIAGNOSIS — Z5987 Material hardship due to limited financial resources, not elsewhere classified: Secondary | ICD-10-CM

## 2014-04-22 DIAGNOSIS — F1020 Alcohol dependence, uncomplicated: Principal | ICD-10-CM | POA: Diagnosis present

## 2014-04-22 DIAGNOSIS — Z5989 Other problems related to housing and economic circumstances: Secondary | ICD-10-CM | POA: Diagnosis not present

## 2014-04-22 DIAGNOSIS — Z803 Family history of malignant neoplasm of breast: Secondary | ICD-10-CM

## 2014-04-22 DIAGNOSIS — F411 Generalized anxiety disorder: Secondary | ICD-10-CM | POA: Diagnosis present

## 2014-04-22 DIAGNOSIS — Z880 Allergy status to penicillin: Secondary | ICD-10-CM | POA: Insufficient documentation

## 2014-04-22 DIAGNOSIS — Z598 Other problems related to housing and economic circumstances: Secondary | ICD-10-CM

## 2014-04-22 DIAGNOSIS — Z5900 Homelessness unspecified: Secondary | ICD-10-CM

## 2014-04-22 DIAGNOSIS — F332 Major depressive disorder, recurrent severe without psychotic features: Secondary | ICD-10-CM | POA: Diagnosis present

## 2014-04-22 DIAGNOSIS — F331 Major depressive disorder, recurrent, moderate: Secondary | ICD-10-CM

## 2014-04-22 DIAGNOSIS — F10230 Alcohol dependence with withdrawal, uncomplicated: Secondary | ICD-10-CM

## 2014-04-22 DIAGNOSIS — F3289 Other specified depressive episodes: Secondary | ICD-10-CM | POA: Insufficient documentation

## 2014-04-22 DIAGNOSIS — F32A Depression, unspecified: Secondary | ICD-10-CM

## 2014-04-22 LAB — CBC
HCT: 43.4 % (ref 39.0–52.0)
Hemoglobin: 14.9 g/dL (ref 13.0–17.0)
MCH: 24.2 pg — AB (ref 26.0–34.0)
MCHC: 34.3 g/dL (ref 30.0–36.0)
MCV: 70.6 fL — AB (ref 78.0–100.0)
PLATELETS: 284 10*3/uL (ref 150–400)
RBC: 6.15 MIL/uL — AB (ref 4.22–5.81)
RDW: 14.7 % (ref 11.5–15.5)
WBC: 8.4 10*3/uL (ref 4.0–10.5)

## 2014-04-22 LAB — COMPREHENSIVE METABOLIC PANEL
ALT: 25 U/L (ref 0–53)
AST: 42 U/L — AB (ref 0–37)
Albumin: 4 g/dL (ref 3.5–5.2)
Alkaline Phosphatase: 104 U/L (ref 39–117)
Anion gap: 16 — ABNORMAL HIGH (ref 5–15)
BUN: 16 mg/dL (ref 6–23)
CALCIUM: 8.6 mg/dL (ref 8.4–10.5)
CO2: 22 meq/L (ref 19–32)
CREATININE: 1.39 mg/dL — AB (ref 0.50–1.35)
Chloride: 102 mEq/L (ref 96–112)
GFR, EST AFRICAN AMERICAN: 71 mL/min — AB (ref >90)
GFR, EST NON AFRICAN AMERICAN: 61 mL/min — AB (ref >90)
Glucose, Bld: 106 mg/dL — ABNORMAL HIGH (ref 70–99)
Potassium: 4.2 mEq/L (ref 3.7–5.3)
Sodium: 140 mEq/L (ref 137–147)
Total Bilirubin: 0.3 mg/dL (ref 0.3–1.2)
Total Protein: 8 g/dL (ref 6.0–8.3)

## 2014-04-22 LAB — RAPID URINE DRUG SCREEN, HOSP PERFORMED
Amphetamines: NOT DETECTED
BENZODIAZEPINES: NOT DETECTED
Barbiturates: NOT DETECTED
Cocaine: NOT DETECTED
Opiates: NOT DETECTED
Tetrahydrocannabinol: NOT DETECTED

## 2014-04-22 LAB — SALICYLATE LEVEL

## 2014-04-22 LAB — ACETAMINOPHEN LEVEL: Acetaminophen (Tylenol), Serum: <15 ug/mL (ref 10–30)

## 2014-04-22 LAB — ETHANOL: ALCOHOL ETHYL (B): 343 mg/dL — AB (ref 0–11)

## 2014-04-22 MED ORDER — CHLORDIAZEPOXIDE HCL 25 MG PO CAPS: 25.0000 mg | ORAL_CAPSULE | Freq: Four times a day (QID) | ORAL | Status: AC | PRN

## 2014-04-22 MED ORDER — ONDANSETRON 4 MG PO TBDP: 4.0000 mg | ORAL_TABLET | Freq: Four times a day (QID) | ORAL | Status: AC | PRN

## 2014-04-22 MED ORDER — CHLORDIAZEPOXIDE HCL 25 MG PO CAPS
25.0000 mg | ORAL_CAPSULE | ORAL | Status: AC
  Administered 2014-04-24 – 2014-04-25 (×2): 25 mg via ORAL
  Filled 2014-04-22 (×2): qty 1

## 2014-04-22 MED ORDER — LISINOPRIL 10 MG PO TABS
10.0000 mg | ORAL_TABLET | Freq: Every day | ORAL | Status: DC
  Administered 2014-04-23 – 2014-04-26 (×4): 10 mg via ORAL
  Filled 2014-04-22: qty 1
  Filled 2014-04-22: qty 14
  Filled 2014-04-22 (×5): qty 1

## 2014-04-22 MED ORDER — ONDANSETRON HCL 4 MG PO TABS: 4.0000 mg | ORAL_TABLET | Freq: Three times a day (TID) | ORAL | Status: DC | PRN

## 2014-04-22 MED ORDER — LISINOPRIL 20 MG PO TABS
20.0000 mg | ORAL_TABLET | Freq: Once | ORAL | Status: AC
  Administered 2014-04-22: 20 mg via ORAL
  Filled 2014-04-22 (×2): qty 1

## 2014-04-22 MED ORDER — ALUM & MAG HYDROXIDE-SIMETH 200-200-20 MG/5ML PO SUSP: 30.0000 mL | ORAL | Status: DC | PRN

## 2014-04-22 MED ORDER — ACETAMINOPHEN 325 MG PO TABS: 650.0000 mg | ORAL_TABLET | Freq: Four times a day (QID) | ORAL | Status: DC | PRN

## 2014-04-22 MED ORDER — ZOLPIDEM TARTRATE 5 MG PO TABS
5.0000 mg | ORAL_TABLET | Freq: Every evening | ORAL | Status: DC | PRN
  Filled 2014-04-22: qty 1

## 2014-04-22 MED ORDER — ZOLPIDEM TARTRATE 5 MG PO TABS: 5.0000 mg | ORAL_TABLET | Freq: Every evening | ORAL | Status: DC | PRN

## 2014-04-22 MED ORDER — LORAZEPAM 1 MG PO TABS
1.0000 mg | ORAL_TABLET | Freq: Three times a day (TID) | ORAL | Status: DC | PRN
  Administered 2014-04-22: 1 mg via ORAL
  Filled 2014-04-22: qty 1

## 2014-04-22 MED ORDER — CHLORDIAZEPOXIDE HCL 25 MG PO CAPS
25.0000 mg | ORAL_CAPSULE | Freq: Once | ORAL | Status: AC
  Administered 2014-04-22: 25 mg via ORAL

## 2014-04-22 MED ORDER — IBUPROFEN 200 MG PO TABS: 600.0000 mg | ORAL_TABLET | Freq: Three times a day (TID) | ORAL | Status: DC | PRN

## 2014-04-22 MED ORDER — ACETAMINOPHEN 325 MG PO TABS: 650.0000 mg | ORAL_TABLET | ORAL | Status: DC | PRN

## 2014-04-22 MED ORDER — ATENOLOL 50 MG PO TABS
50.0000 mg | ORAL_TABLET | Freq: Every day | ORAL | Status: DC
  Administered 2014-04-23 – 2014-04-26 (×4): 50 mg via ORAL
  Filled 2014-04-22 (×2): qty 1
  Filled 2014-04-22: qty 14
  Filled 2014-04-22 (×5): qty 1

## 2014-04-22 MED ORDER — NICOTINE 21 MG/24HR TD PT24: 21.0000 mg | MEDICATED_PATCH | Freq: Every day | TRANSDERMAL | Status: DC

## 2014-04-22 MED ORDER — NICOTINE 21 MG/24HR TD PT24
21.0000 mg | MEDICATED_PATCH | Freq: Every day | TRANSDERMAL | Status: DC
  Filled 2014-04-22 (×6): qty 1

## 2014-04-22 MED ORDER — ADULT MULTIVITAMIN W/MINERALS CH
1.0000 | ORAL_TABLET | Freq: Every day | ORAL | Status: DC
  Administered 2014-04-22 – 2014-04-26 (×5): 1 via ORAL
  Filled 2014-04-22 (×8): qty 1

## 2014-04-22 MED ORDER — CHLORDIAZEPOXIDE HCL 25 MG PO CAPS
25.0000 mg | ORAL_CAPSULE | Freq: Once | ORAL | Status: AC
  Filled 2014-04-22: qty 1

## 2014-04-22 MED ORDER — MAGNESIUM HYDROXIDE 400 MG/5ML PO SUSP
30.0000 mL | Freq: Every day | ORAL | Status: DC | PRN
Start: 2014-04-22 — End: 2014-04-27

## 2014-04-22 MED ORDER — HYDROXYZINE HCL 25 MG PO TABS
25.0000 mg | ORAL_TABLET | Freq: Four times a day (QID) | ORAL | Status: AC | PRN
  Administered 2014-04-22 – 2014-04-23 (×2): 25 mg via ORAL
  Filled 2014-04-22 (×2): qty 1

## 2014-04-22 MED ORDER — VITAMIN B-1 100 MG PO TABS
100.0000 mg | ORAL_TABLET | Freq: Every day | ORAL | Status: DC
  Administered 2014-04-22 – 2014-04-26 (×5): 100 mg via ORAL
  Filled 2014-04-22 (×7): qty 1

## 2014-04-22 MED ORDER — CHLORDIAZEPOXIDE HCL 25 MG PO CAPS
25.0000 mg | ORAL_CAPSULE | Freq: Three times a day (TID) | ORAL | Status: AC
  Administered 2014-04-23 – 2014-04-24 (×3): 25 mg via ORAL
  Filled 2014-04-22 (×4): qty 1

## 2014-04-22 MED ORDER — ATENOLOL 50 MG PO TABS
50.0000 mg | ORAL_TABLET | Freq: Once | ORAL | Status: AC
  Administered 2014-04-22: 50 mg via ORAL
  Filled 2014-04-22: qty 1

## 2014-04-22 MED ORDER — THIAMINE HCL 100 MG/ML IJ SOLN: 100.0000 mg | Freq: Once | INTRAMUSCULAR | Status: DC

## 2014-04-22 MED ORDER — TRAZODONE HCL 50 MG PO TABS
50.0000 mg | ORAL_TABLET | Freq: Every day | ORAL | Status: DC
  Administered 2014-04-22 – 2014-04-26 (×5): 50 mg via ORAL
  Filled 2014-04-22: qty 1
  Filled 2014-04-22: qty 14
  Filled 2014-04-22 (×6): qty 1

## 2014-04-22 MED ORDER — CHLORDIAZEPOXIDE HCL 25 MG PO CAPS
25.0000 mg | ORAL_CAPSULE | Freq: Four times a day (QID) | ORAL | Status: AC
  Administered 2014-04-22 – 2014-04-23 (×4): 25 mg via ORAL
  Filled 2014-04-22 (×3): qty 1

## 2014-04-22 MED ORDER — CHLORDIAZEPOXIDE HCL 25 MG PO CAPS
25.0000 mg | ORAL_CAPSULE | Freq: Every day | ORAL | Status: AC
  Administered 2014-04-26: 25 mg via ORAL
  Filled 2014-04-22: qty 1

## 2014-04-22 MED ORDER — CHLORDIAZEPOXIDE HCL 25 MG PO CAPS: 25.0000 mg | ORAL_CAPSULE | Freq: Four times a day (QID) | ORAL | Status: DC | PRN

## 2014-04-22 MED ORDER — IBUPROFEN 600 MG PO TABS
600.0000 mg | ORAL_TABLET | Freq: Three times a day (TID) | ORAL | Status: DC | PRN
  Administered 2014-04-26: 600 mg via ORAL
  Filled 2014-04-22: qty 1

## 2014-04-22 MED ORDER — LOPERAMIDE HCL 2 MG PO CAPS: 2.0000 mg | ORAL_CAPSULE | ORAL | Status: AC | PRN

## 2014-04-22 NOTE — ED Notes (Signed)
[facility] here to transport 
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
## 2014-04-22 NOTE — ED Notes (Signed)
Catha Nottingham NP aware of CIWA score.

## 2014-04-22 NOTE — ED Notes (Addendum)
Up to the bathroom, pt is aware that we need a urine specimen
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
## 2014-04-22 NOTE — ED Notes (Signed)
Counselor in to see pt

## 2014-04-22 NOTE — BH Assessment (Signed)
Received a call for a tele-assessment. Spoke with [name] Madura, PA-C who reported that pt has a history of depression and is currently reporting suicidal ideations. Pt reported that his family was recently evicted from their home. Contacted RN Latricia to set up tele-assessment machine and was informed that pt has not arrived in the [facility]. RN will contact TTS when pt is ready for his tele-assessment.

## 2014-04-22 NOTE — ED Provider Notes (Signed)
Medical screening examination/treatment/procedure(s) were performed by non-physician practitioner and as supervising physician I was immediately available for consultation/collaboration.    Shaine Mount D Emmali Karow, MD 04/22/14 0652 

## 2014-04-22 NOTE — ED Notes (Signed)
tts into see 

## 2014-04-22 NOTE — Progress Notes (Signed)
Psychoeducational Group Note  Date:  04/22/2014 Time:  2209  Group Topic/Focus:  Wrap-Up Group:   The focus of this group is to help patients review their daily goal of treatment and discuss progress on daily workbooks.  Participation Level: Did Not Attend  Participation Quality:  Not Applicable  Affect:  Not Applicable  Cognitive:  Not Applicable  Insight:  Not Applicable  Engagement in Group: Not Applicable  Additional Comments:  The patient did not attend group this evening since he was not feeling well.   Jaxsun Ciampi S 04/22/2014, 10:09 PM

## 2014-04-22 NOTE — ED Notes (Signed)
[facility] contacted for transport 
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
## 2014-04-22 NOTE — Progress Notes (Signed)
Patient ID: [name] Donovan, male   DOB: Jul 24, 1971, 43 y.o.   MRN: [id number] Patient is a 42 y/o AAM admitted voluntarily following medical clearance from [facility]. Presents requesting alcohole detox with subsequent long term term (28 day) treatment.  Reports daily etoh use that has resulted in relationship conflicts, losing employment, and eviction from his apartment.  Initially reported SI at ED admit, but denies this now.  Patient is flat and guarded with depressed mood.  He is cooperative and appears motivated for treatment.  BP was elevated on admission and reported to extender.  Orientation to unit. Food and fluids offered.  POC and 15' checks initiated.  Safety maintained.

## 2014-04-22 NOTE — ED Notes (Signed)
[facility] will call back for report 

## 2014-04-22 NOTE — ED Notes (Signed)
LuAnne RN at [facility] updated,  Pt to transport to  [facility] by [employer]
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
## 2014-04-22 NOTE — ED Notes (Addendum)
Pt ambulatory w/o difficulty to [facility] w/ [facility].  Belongings sent w/ driver. 

## 2014-04-22 NOTE — [facility] Group Notes (Signed)
[facility] Group Notes: (Clinical Social Work)   04/22/2014      Type of Therapy:  Group Therapy   Participation Level:  Did Not Attend    Ambrose Mantle, LCSW 04/22/2014, 4:19 PM

## 2014-04-22 NOTE — Tx Team (Signed)
Initial Interdisciplinary Treatment Plan   PATIENT STRESSORS: Financial difficulties Health problems Occupational concerns Substance abuse   PROBLEM LIST: Problem List/Patient Goals Date to be addressed Date deferred Reason deferred Estimated date of resolution  HTN- medication non-compliance 04/22/2014   D/C        ETOH abuse- daily consumption 04/22/2014   D/C                                       DISCHARGE CRITERIA:  Ability to meet basic life and health needs Improved stabilization in mood, thinking, and/or behavior Verbal commitment to aftercare and medication compliance Withdrawal symptoms are absent or subacute and managed without 24-hour nursing intervention  PRELIMINARY DISCHARGE PLAN: Attend 12-step recovery group Outpatient therapy Placement in alternative living arrangements  PATIENT/FAMIILY INVOLVEMENT: This treatment plan has been presented to and reviewed with the patient, [name] Donovan, and/or family member,  The patient and family have been given the opportunity to ask questions and make suggestions.  Cresenciano Lick 04/22/2014, 1:50 PM

## 2014-04-23 DIAGNOSIS — F1010 Alcohol abuse, uncomplicated: Secondary | ICD-10-CM

## 2014-04-23 DIAGNOSIS — F332 Major depressive disorder, recurrent severe without psychotic features: Secondary | ICD-10-CM

## 2014-04-23 DIAGNOSIS — R45851 Suicidal ideations: Secondary | ICD-10-CM

## 2014-04-23 MED ORDER — CLONIDINE HCL 0.1 MG PO TABS
ORAL_TABLET | ORAL | Status: AC
  Administered 2014-04-23: 0.1 mg
  Filled 2014-04-23: qty 1

## 2014-04-23 MED ORDER — CLONIDINE HCL 0.1 MG PO TABS
0.1000 mg | ORAL_TABLET | Freq: Two times a day (BID) | ORAL | Status: DC
  Administered 2014-04-23 – 2014-04-26 (×7): 0.1 mg via ORAL
  Filled 2014-04-23 (×9): qty 1
  Filled 2014-04-23 (×2): qty 28
  Filled 2014-04-23: qty 1

## 2014-04-23 MED ORDER — SERTRALINE HCL 50 MG PO TABS
50.0000 mg | ORAL_TABLET | Freq: Every day | ORAL | Status: DC
  Administered 2014-04-23 – 2014-04-26 (×4): 50 mg via ORAL
  Filled 2014-04-23 (×4): qty 1
  Filled 2014-04-23: qty 14
  Filled 2014-04-23 (×2): qty 1

## 2014-04-23 NOTE — Progress Notes (Signed)
Writer entered patients room and observed him lying in bed resting and was easily aroused when his name was called. Writer spoke with him concerning his elevated blood pressure that had been taken earlier. Writer received orders from C. Mauri Brooklyn. and medications given along with a pitcher of gatorade for hydration. Writer reminded patient of fall precautions and he was receptive to teaching. Patient reports that his bp was high b/c he had stopped his blood pressure medications for a while b/c he was having an allergic reaction to it . Patient offered snacks and adjusted temperature in his room as requested. Patient voiced no complaints and denies si/hi/a/v hallucinations. Support and encouragement given, safety maintained on unit with 15 min checks.

## 2014-04-23 NOTE — Progress Notes (Signed)
Psychoeducational Group Note  Date: 04/23/2014 Time  0930 Group Topic/Focus:  Gratefulness:  The focus of this group is to help patients identify what two things they are most grateful for in their lives. What helps ground them and to center them on their work to their recovery.  Participation Level: Did not attend Ajene Carchi A   

## 2014-04-23 NOTE — Progress Notes (Signed)
Psychoeducational Group Note  Date:  04/23/2014 Time:  2341  Group Topic/Focus:  Wrap-Up Group:   The focus of this group is to help patients review their daily goal of treatment and discuss progress on daily workbooks.  Participation Level: Did Not Attend  Participation Quality:  Not Applicable  Affect:  Not Applicable  Cognitive:  Not Applicable  Insight:  Not Applicable  Engagement in Group: Not Applicable  Additional Comments:  The patient did not attend group this evening since he was not feeling well.   Hazle Coca S 04/23/2014, 11:41 PM

## 2014-04-23 NOTE — Progress Notes (Signed)
[name] has spent all day ( except mealtimes) sleeping. When he is awokened by staff, he is pleasant and appreciative of all care afforded him today and he apologizes for staying in his bed....saying " I feel so bad, I just want to sleep it off". This nurse educates pt about alcohol withdrawal, the need for increased fluids, the reason he is on librium, the need for extra fluids and the reason he is so tired (increased metabolsim and increased stress that the body is under".   A He states he understands this philosophy and thanks this nurse for giving him this info. His vital signs are monitored as well as his po intake and his diastolic BP has begun to come down today ( it is 98 at 1400!1). Per MD order, he is started on clonidine 0.1 mg po id.    R Safety is in place and poc cont.
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
## 2014-04-23 NOTE — [facility] Group Notes (Signed)
[facility] Group Notes: (Clinical Social Work)   04/23/2014      Type of Therapy:  Group Therapy   Participation Level:  Did Not Attend    Ambrose Mantle, LCSW 04/23/2014, 5:27 PM

## 2014-04-23 NOTE — H&P (Signed)
Psychiatric Admission Assessment Adult  Patient Identification:  [name] Donovan Date of Evaluation:  04/23/2014 Chief Complaint:  "I have lost everything because of my depression and alcohol abuse."  History of Present Illness::   [name] Donovan is a 43 year old male who presented voluntarily to [facility] with complaining of severe depression with suicidal thoughts. The patient had plan to aggravate dangerous people in the community to hurt him. He called EMS to be taken to [facility] to obtain help. The patient has several stressors of being evicted last week, a conflict with son, and worsening alcohol abuse. Patient states today during his psychiatric admission "I lost my mother three years ago. I am still depressed about her loss. My depression is ruining my life. I lost my job, car, relationship, and home. I just want to disappear. I accomplish this by drinking alcohol. I feel very hopeless. I have never actually taken any medications. I never liked them. A long time ago I was given a prescription but never took it. I want treatment. I had called [facility] but found out you need to be detoxed first. I have having many withdrawal symptoms. I also feel dizzy. But I'm not sure if that is because of my blood pressure. I do not have a Primary Care Provider. I feel very tired today. I am ready to receive help." Patient was cooperative with the admission assessment. He denies other substance abuse beside alcohol and his UDS is negative. His alcohol level on admission was 343.   Elements:  Location:  Adult 500 hall . Quality:  Depression, Alcohol abuse . Severity:  Severe . Timing:  Last few months. Duration:  Several years. Context:  Conflict with son, several recent losses, worsening of symptoms. Associated Signs/Synptoms: Depression Symptoms:  depressed mood, anhedonia, psychomotor retardation, fatigue, feelings of worthlessness/guilt, difficulty concentrating, recurrent thoughts of death, suicidal  thoughts without plan, anxiety, hypersomnia, disturbed sleep, (Hypo) Manic Symptoms:  Denies Anxiety Symptoms:  Excessive Worry, Psychotic Symptoms:  Denies PTSD Symptoms: NA Total Time spent with patient: 1 hour  Psychiatric Specialty Exam: Physical Exam  Constitutional:  Physical exam findings reviewed from [facility] and I concur with no noted exceptions.   Psychiatric: His speech is normal. His affect is blunt. He is slowed and withdrawn. He exhibits a depressed mood. He expresses suicidal ideation.    Review of Systems  Constitutional: Positive for chills, malaise/fatigue and diaphoresis.  HENT: Negative.   Eyes: Negative.   Respiratory: Negative.   Cardiovascular: Negative.   Gastrointestinal: Positive for nausea.  Genitourinary: Negative.   Musculoskeletal: Negative.   Skin: Negative.   Neurological: Positive for dizziness and tremors.  Endo/Heme/Allergies: Negative.   Psychiatric/Behavioral: Positive for depression, suicidal ideas and substance abuse. The patient is nervous/anxious and has insomnia.     Blood pressure 138/113, pulse 70, temperature 97.7 F (36.5 C), temperature source Oral, resp. rate 18, height 5' 11.5" (1.816 m), weight 147.419 kg (325 lb), SpO2 97.00%.Body mass index is 44.7 kg/(m^2).  General Appearance: Casual  Eye Contact::  Minimal  Speech:  Clear and Coherent and Normal Rate  Volume:  Decreased  Mood:  Depressed  Affect:  Flat  Thought Process:  Goal Directed, Linear and Logical  Orientation:  Full (Time, Place, and Person)  Thought Content:  Negative  Suicidal Thoughts:  Yes.  with intent/plan  Homicidal Thoughts:  No  Memory:  Immediate;   Good Recent;   Good Remote;   Good  Judgement:  Fair  Insight:  Fair  Psychomotor Activity:  Decreased  Concentration:  Good  Recall:  Good  Fund of Knowledge:Good  Language: Good  Akathisia:  No  Handed:  Right  AIMS (if indicated):     Assets:  Communication Skills Desire for  Improvement Resilience  Sleep:  Number of Hours: 6.75   Musculoskeletal: Strength & Muscle Tone: within normal limits Gait & Station: normal Patient leans: N/A  Past Psychiatric History: Yes Diagnosis:MDD  Hospitalizations:Once in [place]   Outpatient Care:[facility] in the distant past  Substance Abuse Care: Denies  Self-Mutilation:Denies   Suicidal Attempts:Reports overdose on xanax in the past and attempt to hang self  Violent Behaviors:   Past Medical History:   Past Medical History  Diagnosis Date  . Hypertension   . Depression    None. Allergies:   Allergies  Allergen Reactions  . Penicillins Hives, Shortness Of Breath and Rash    dizziness  . Hydrochlorothiazide Other (See Comments)    Made his lips swell   PTA Medications: No prescriptions prior to admission    Previous Psychotropic Medications:  Medication/Dose  Denies               Substance Abuse History in the last 12 months:  Yes.    Consequences of Substance Abuse: Legal Consequences:  Reports past DUI, four current misdemeanor charges related to alcohol abuse, Court date on 8/31 for an open container and two trespassing charges.   Withdrawal Symptoms: nausea, tremors, chills, anxiety  Social History:  reports that he has been smoking Cigarettes.  He has been smoking about 0.00 packs per day. He does not have any smokeless tobacco history on file. He reports that he drinks alcohol. He reports that he uses illicit drugs (Marijuana) about once per week. Additional Social History: Pain Medications: See PTAmedication list Prescriptions: Pt has prescriptionfor BP meds but does not take. Over the Counter: See PTA medication list Longest period of sobriety (when/how long): 3 years Negative Consequences of Use: Financial;Legal;Personal relationships Withdrawal Symptoms: Agitation;Irritability;Sweats;Change in blood pressure Name of Substance 1: ETOH (usually beer) 1 - Age of First Use: 43 years  of age 42 - Amount (size/oz): two 65's per day 1 - Frequency: Daily use 1 - Duration: on-going 1 - Last Use / Amount: 08/29                  Current Place of Residence:   Place of Birth:   Family Members: Marital Status:  Single Children:4  Sons:  Daughters: Relationships: Education:  HS Soil scientist Problems/Performance: Religious Beliefs/Practices: History of Abuse (Emotional/Phsycial/Sexual) Occupational Experiences; Military History:  None. Legal History: Hobbies/Interests:  Family History:   Family History  Problem Relation Age of Onset  . Hypertension Mother   . Heart disease Father   . Heart disease Brother     Results for orders placed during the hospital encounter of 04/22/14 (from the past 72 hour(s))  ACETAMINOPHEN LEVEL     Status: None   Collection Time    04/22/14  1:01 AM      Result Value Ref Range   Acetaminophen (Tylenol), Serum <15.0  10 - 30 ug/mL   Comment:            THERAPEUTIC CONCENTRATIONS VARY     SIGNIFICANTLY. A RANGE OF 10-30     ug/mL MAY BE AN EFFECTIVE     CONCENTRATION FOR MANY PATIENTS.     HOWEVER, SOME ARE BEST TREATED     AT CONCENTRATIONS OUTSIDE THIS     RANGE.  ACETAMINOPHEN CONCENTRATIONS     >150 ug/mL AT 4 HOURS AFTER     INGESTION AND >50 ug/mL AT 12     HOURS AFTER INGESTION ARE     OFTEN ASSOCIATED WITH TOXIC     REACTIONS.  CBC     Status: Abnormal   Collection Time    04/22/14  1:01 AM      Result Value Ref Range   WBC 8.4  4.0 - 10.5 K/uL   RBC 6.15 (*) 4.22 - 5.81 MIL/uL   Hemoglobin 14.9  13.0 - 17.0 g/dL   HCT 43.4  39.0 - 52.0 %   MCV 70.6 (*) 78.0 - 100.0 fL   MCH 24.2 (*) 26.0 - 34.0 pg   MCHC 34.3  30.0 - 36.0 g/dL   RDW 14.7  11.5 - 15.5 %   Platelets 284  150 - 400 K/uL  COMPREHENSIVE METABOLIC PANEL     Status: Abnormal   Collection Time    04/22/14  1:01 AM      Result Value Ref Range   Sodium 140  137 - 147 mEq/L   Potassium 4.2  3.7 - 5.3 mEq/L   Chloride 102  96 -  112 mEq/L   CO2 22  19 - 32 mEq/L   Glucose, Bld 106 (*) 70 - 99 mg/dL   BUN 16  6 - 23 mg/dL   Creatinine, Ser 1.39 (*) 0.50 - 1.35 mg/dL   Calcium 8.6  8.4 - 10.5 mg/dL   Total Protein 8.0  6.0 - 8.3 g/dL   Albumin 4.0  3.5 - 5.2 g/dL   AST 42 (*) 0 - 37 U/L   ALT 25  0 - 53 U/L   Alkaline Phosphatase 104  39 - 117 U/L   Total Bilirubin 0.3  0.3 - 1.2 mg/dL   GFR calc non Af Amer 61 (*) >90 mL/min   GFR calc Af Amer 71 (*) >90 mL/min   Comment: (NOTE)     The eGFR has been calculated using the CKD EPI equation.     This calculation has not been validated in all clinical situations.     eGFR's persistently <90 mL/min signify possible Chronic Kidney     Disease.   Anion gap 16 (*) 5 - 15  ETHANOL     Status: Abnormal   Collection Time    04/22/14  1:01 AM      Result Value Ref Range   Alcohol, Ethyl (B) 343 (*) 0 - 11 mg/dL   Comment:            LOWEST DETECTABLE LIMIT FOR     SERUM ALCOHOL IS 11 mg/dL     FOR MEDICAL PURPOSES ONLY  SALICYLATE LEVEL     Status: Abnormal   Collection Time    04/22/14  1:01 AM      Result Value Ref Range   Salicylate Lvl <0.2 (*) 2.8 - 20.0 mg/dL  URINE RAPID DRUG SCREEN (HOSP PERFORMED)     Status: None   Collection Time    04/22/14 12:16 PM      Result Value Ref Range   Opiates NONE DETECTED  NONE DETECTED   Cocaine NONE DETECTED  NONE DETECTED   Benzodiazepines NONE DETECTED  NONE DETECTED   Amphetamines NONE DETECTED  NONE DETECTED   Tetrahydrocannabinol NONE DETECTED  NONE DETECTED   Barbiturates NONE DETECTED  NONE DETECTED   Comment:  DRUG SCREEN FOR MEDICAL PURPOSES     ONLY.  IF CONFIRMATION IS NEEDED     FOR ANY PURPOSE, NOTIFY LAB     WITHIN 5 DAYS.                LOWEST DETECTABLE LIMITS     FOR URINE DRUG SCREEN     Drug Class       Cutoff (ng/mL)     Amphetamine      1000     Barbiturate      200     Benzodiazepine   161     Tricyclics       096     Opiates          300     Cocaine          300     THC               50   Psychological Evaluations:  Assessment:   DSM5:  AXIS I:  Alcohol Abuse and Major Depression, Recurrent severe AXIS II:  Deferred AXIS III:   Past Medical History  Diagnosis Date  . Hypertension   . Depression    AXIS IV:  economic problems, other psychosocial or environmental problems and problems with primary support group AXIS V:  41-50 serious symptoms  Treatment Plan/Recommendations:   1. Admit for crisis management and stabilization. Estimated length of stay 5-7 days. 2. Medication management to reduce current symptoms to base line and improve the patient's level of functioning.  3. Develop treatment plan to decrease risk of relapse upon discharge of depressive symptoms and the need for readmission. 5. Group therapy to facilitate development of healthy coping skills to use for depression and anxiety. 6. Health care follow up as needed for medical problems. Start Tenormin 50 mg daily and Clonidine 0.1 mg BID for Hypertension.  7. Discharge plan to include therapy to help patient cope with  stressors.  8. Call for Consult with Hospitalist for additional specialty patient services as needed.   Treatment Plan Summary: Daily contact with patient to assess and evaluate symptoms and progress in treatment Medication management Current Medications:  Current Facility-Administered Medications  Medication Dose Route Frequency Provider Last Rate Last Dose  . acetaminophen (TYLENOL) tablet 650 mg  650 mg Oral Q4H PRN Waylan Boga, NP      . alum & mag hydroxide-simeth (MAALOX/MYLANTA) 200-200-20 MG/5ML suspension 30 mL  30 mL Oral PRN Waylan Boga, NP      . alum & mag hydroxide-simeth (MAALOX/MYLANTA) 200-200-20 MG/5ML suspension 30 mL  30 mL Oral Q4H PRN Waylan Boga, NP      . atenolol (TENORMIN) tablet 50 mg  50 mg Oral Daily Dara Hoyer, PA-C   50 mg at 04/23/14 0920  . chlordiazePOXIDE (LIBRIUM) capsule 25 mg  25 mg Oral Q6H PRN Waylan Boga, NP      .  chlordiazePOXIDE (LIBRIUM) capsule 25 mg  25 mg Oral TID Waylan Boga, NP       Followed by  . [START ON 04/24/2014] chlordiazePOXIDE (LIBRIUM) capsule 25 mg  25 mg Oral BH-qamhs Waylan Boga, NP       Followed by  . [START ON 04/26/2014] chlordiazePOXIDE (LIBRIUM) capsule 25 mg  25 mg Oral Daily Waylan Boga, NP      . cloNIDine (CATAPRES) tablet 0.1 mg  0.1 mg Oral BID Charlcie Cradle, MD      . hydrOXYzine (ATARAX/VISTARIL) tablet 25 mg  25 mg Oral  Q6H PRN Waylan Boga, NP   25 mg at 04/22/14 2202  . ibuprofen (ADVIL,MOTRIN) tablet 600 mg  600 mg Oral Q8H PRN Waylan Boga, NP      . lisinopril (PRINIVIL,ZESTRIL) tablet 10 mg  10 mg Oral Daily Encarnacion Slates, NP   10 mg at 04/23/14 0920  . loperamide (IMODIUM) capsule 2-4 mg  2-4 mg Oral PRN Waylan Boga, NP      . magnesium hydroxide (MILK OF MAGNESIA) suspension 30 mL  30 mL Oral Daily PRN Waylan Boga, NP      . multivitamin with minerals tablet 1 tablet  1 tablet Oral Daily Waylan Boga, NP   1 tablet at 04/23/14 6045  . nicotine (NICODERM CQ - dosed in mg/24 hours) patch 21 mg  21 mg Transdermal Daily Waylan Boga, NP      . ondansetron (ZOFRAN) tablet 4 mg  4 mg Oral Q8H PRN Waylan Boga, NP      . ondansetron (ZOFRAN-ODT) disintegrating tablet 4 mg  4 mg Oral Q6H PRN Waylan Boga, NP      . sertraline (ZOLOFT) tablet 50 mg  50 mg Oral Daily Charlcie Cradle, MD   50 mg at 04/23/14 1254  . thiamine (B-1) injection 100 mg  100 mg Intramuscular Once Waylan Boga, NP      . thiamine (VITAMIN B-1) tablet 100 mg  100 mg Oral Daily Waylan Boga, NP   100 mg at 04/23/14 4098  . traZODone (DESYREL) tablet 50 mg  50 mg Oral QHS Dara Hoyer, PA-C   50 mg at 04/22/14 2202    Observation Level/Precautions:  15 minute checks  Laboratory:  CBC Chemistry Profile UDS  Psychotherapy:  Individual/Group Therapy   Medications:  Start Zoloft 50 mg daily for depression, Librium protocol for alcohol detox   Consultations:  As needed  Discharge Concerns:   Continued alcohol abuse  Estimated LOS: 5-7 days  Other:     I certify that inpatient services furnished can reasonably be expected to improve the patient's condition.   Elmarie Shiley NP-C 8/30/20153:03 PM

## 2014-04-23 NOTE — Progress Notes (Signed)
This RN Spoke to Dr Delsa Bern at 1400 today...sidcussing pt's continued high blood pressure, despite librium and atenolol. Order placed of c,onidine 0.1 mg po bid and this is administered to pt, per MD order, after pt educated about medication , need for it as well as potential side affects.  Pt's BP and HR down to 150/98 !!! At 1400. Dr. Pincus Sanes notified by this nurse!

## 2014-04-23 NOTE — H&P (Signed)
I saw this patient face to face. I discussed the information with the midlevel provider and I have reviewed the note and agree.  

## 2014-04-23 NOTE — [facility] Suicide Risk Assessment (Signed)
   Nursing information obtained from:  Patient Demographic factors:  Male;Low socioeconomic status, homeless Current Mental Status:   depression, SI without plan or intent Loss Factors:  Decrease in vocational status;Financial problems / change in socioeconomic status, death of father at 52 and mother 3 yrs ago, poor social support Historical Factors:   hx of 2 previous SA Risk Reduction Factors:   wants to live for his kids, denies access to guns Total Time spent with patient: 30 minutes  CLINICAL FACTORS:   Depression:   Anhedonia Comorbid alcohol abuse/dependence Hopelessness Insomnia Alcohol/Substance Abuse/Dependencies  Psychiatric Specialty Exam: Physical Exam  Psychiatric: His speech is normal. Judgment normal. He is withdrawn. Cognition and memory are normal. He exhibits a depressed mood. He expresses suicidal ideation.    Review of Systems  Cardiovascular: Positive for chest pain.    Blood pressure 179/120, pulse 59, temperature 98.2 F (36.8 C), temperature source Oral, resp. rate 18, height 5' 11.5" (1.816 m), weight 147.419 kg (325 lb), SpO2 97.00%.Body mass index is 44.7 kg/(m^2).  General Appearance: Casual  Eye Contact::  Minimal  Speech:  Clear and Coherent and Normal Rate  Volume:  Decreased  Mood:  Depressed  Affect:  Flat  Thought Process:  Goal Directed, Linear and Logical  Orientation:  Full (Time, Place, and Person)  Thought Content:  Negative  Suicidal Thoughts:  Yes.  without intent/plan  Homicidal Thoughts:  No  Memory:  Negative NA  Judgement:  Fair  Insight:  Fair  Psychomotor Activity:  Decreased  Concentration:  Good  Recall:  Good  Fund of Knowledge:Good  Language: Good  Akathisia:  No  Handed:  Right  AIMS (if indicated):     Assets:  Communication Skills Desire for Improvement  Sleep:  Number of Hours: 6.75   Musculoskeletal: Strength & Muscle Tone: within normal limits Gait & Station: normal Patient leans: N/A  COGNITIVE  FEATURES THAT CONTRIBUTE TO RISK:  Thought constriction (tunnel vision)    SUICIDE RISK:   Mild:  Suicidal ideation of limited frequency, intensity, duration, and specificity.  There are no identifiable plans, no associated intent, mild dysphoria and related symptoms, good self-control (both objective and subjective assessment), few other risk factors, and identifiable protective factors, including available and accessible social support.  PLAN OF CARE: Admit to [facility] inpt unit for treatment of depression and alcohol withdrawal Start home meds where appropriate Start trial of Zoloft  po qD for depression Encouraged groups  I certify that inpatient services furnished can reasonably be expected to improve the patient's condition.  Andreu Drudge 04/23/2014, 10:09 AM

## 2014-04-23 NOTE — Plan of Care (Signed)
Problem: Ineffective individual coping Goal: STG: Patient will remain free from self harm Outcome: Progressing Patient remained free from self harm with 15 minute checks in place.  Problem: Alteration in mood & ability to function due to Goal: STG-Patient will report withdrawal symptoms Outcome: Progressing Writer informed patient of medications available for withdrawal symptoms and patient currently needing medication for anxiety which he received. Goal: STG-Patient will attend groups Outcome: Not Progressing Patient did not attend group this evening, writer encouraged participation tomorrow when feeling better.

## 2014-04-23 NOTE — Progress Notes (Signed)
Psychoeducational Group Note  Date:  04/23/2014 Time:  1015  Group Topic/Focus:  Making Healthy Choices:   The focus of this group is to help patients identify negative/unhealthy choices they were using prior to admission and identify positive/healthier coping strategies to replace them upon discharge.  Participation Level:  Did not Karl Ito 04/23/2014

## 2014-04-24 ENCOUNTER — Encounter (HOSPITAL_COMMUNITY): Payer: Self-pay | Admitting: Psychiatry

## 2014-04-24 DIAGNOSIS — F1020 Alcohol dependence, uncomplicated: Secondary | ICD-10-CM | POA: Diagnosis present

## 2014-04-24 DIAGNOSIS — F1910 Other psychoactive substance abuse, uncomplicated: Secondary | ICD-10-CM

## 2014-04-24 DIAGNOSIS — F331 Major depressive disorder, recurrent, moderate: Secondary | ICD-10-CM

## 2014-04-24 NOTE — Progress Notes (Signed)
Patient ID: [name] Donovan, male   DOB: Aug 14, 1971, 43 y.o.   MRN: [id number] He has been in bed parts of day. Was up for a couple  Once and has had some interaction with peers. He reports withdrawal symptoms but refused off of prn medication saying I will be ok.  Self inventory today:epression, hopelessness, and anxiety 8's. Withdrawals of craving, diarrhea, agitation,, nausea,chilling and runny nose.  He denies SI thoughts. His goal is to get passed detox  By taking    his  Medications and resting.

## 2014-04-24 NOTE — Plan of Care (Signed)
Problem: Diagnosis: Increased Risk For Suicide Attempt Goal: STG-Patient Will Attend All Groups On The Unit Outcome: Not Progressing Patient did not attend group this evening d/t withdrawal symptoms.  Problem: Alteration in mood Goal: LTG-Patient reports reduction in suicidal thoughts (Patient reports reduction in suicidal thoughts and is able to verbalize a safety plan for whenever patient is feeling suicidal) Outcome: Progressing Patient denies SI Goal: STG-Patient reports thoughts of self-harm to staff Outcome: Progressing Patient verbally agrees to notify staff if thoughts to harm self

## 2014-04-24 NOTE — Progress Notes (Signed)
NUTRITION ASSESSMENT  Pt identified as at risk on the Malnutrition Screen Tool  INTERVENTION: Educated patient on the importance of nutrition and encouraged intake of food and beverages.   NUTRITION DIAGNOSIS: Altered GI function related to alcohol abuse as evidenced by pt report.   Goal: Pt to meet >/= 90% of their estimated nutrition needs.  Monitor:  PO intake  Assessment:  Pt presented [facility] to [facility] with complaining of severe depression with suicidal thoughts. The patient had plan to aggravate dangerous people in the community to hurt him. He called EMS to be taken to [facility] to obtain help. The patient has several stressors of being evicted last week, a conflict with son, and worsening alcohol abuse - 2 40oz beers/day.  - Met with pt who reports he was eating just 1 meal/day 3-4 days PTA due to poor appetite and drinking - Had a friend get him food, meal varied on whatever his friend could bring - Said when he was feeling better he would eat 2 meals/day - C/o slight nausea but otherwise eating well today   43 y.o. male  Height: Ht Readings from Last 1 Encounters:  04/22/14 5' 11.5" (1.816 m)    Weight: Wt Readings from Last 1 Encounters:  04/22/14 325 lb (147.419 kg)    Weight Hx: Wt Readings from Last 10 Encounters:  04/22/14 325 lb (147.419 kg)  04/22/14 295 lb (133.811 kg)  06/03/13 298 lb (135.172 kg)  04/28/13 298 lb (135.172 kg)    BMI:  Body mass index is 44.7 kg/(m^2). Pt meets criteria for class III extreme obesity based on current BMI.  Estimated Nutritional Needs: Kcal: 25-30 kcal/kg of ideal body weight Protein: > 1 gram protein/kg Fluid: 1 ml/kcal  Diet Order: Cardiac Pt is also offered choice of unit snacks mid-morning and mid-afternoon.  Pt is eating as desired.   Lab results and medications reviewed. Getting thiamine and multivitamin.   Carlis Stable MS, [place], LDN [phone] Pager [phone] Weekend/After Hours Pager

## 2014-04-24 NOTE — [facility] Group Notes (Signed)
[facility] LCSW Aftercare Discharge Planning Group Note   04/24/2014 10:19 AM    Participation Quality:  Appropraite  Mood/Affect:  Appropriate  Depression Rating:  10  Anxiety Rating:  5  Thoughts of Suicide:  No  Will you contract for safety?   NA  Current AVH:  No  Plan for Discharge/Comments:  Patient attended discharge planning group and actively participated in group.  Patient reports having a difficult day due to detoxing.  He advised of previously being scheduled with [facility] and hoping to be able to go there at discharge.  CSW provided all participants with daily workbook.  Transportation Means: Patient has transportation.   Supports:  Patient has a limited support system.   Bular Hickok, [place] July

## 2014-04-24 NOTE — Progress Notes (Signed)
Writer observed patient lying in bed resting. He reports that he has not felt too good today and has been resting because he has felt dizzy and light-headed off and on today. Patient's pitcher were replenished with gatorade and water and he was encouraged to drink plenty of fluids. Patient did not attend group this evening and writer encouraged him to rest so that hopefully he would feel more up to participating on the unit. Patient's vitals will be taken to see if pressures are coming down. Patient denies si/hi/a/v hallucinations. Safety maintained with 15 min checks.

## 2014-04-24 NOTE — [facility] Group Notes (Signed)
[facility] LCSW Group Therapy  04/24/2014 3:19 PM  Type of Therapy:  Group Therapy  Participation Level:  Did Not Attend  Wynn Banker 8/31/2[facility]015, 3:19 PM

## 2014-04-24 NOTE — Progress Notes (Signed)
[facility] MD Progress Note  04/24/2014 3:28 PM Gina Costilla  MRN:  [id number] Subjective:  [name] Donovan is in bed. Complains of nausea. States " I am withdrawing, that's all" He admits to persistent depression. He feels overwhelmed. He states that his mother's death 3 years ago hit him hard. He started drinking more and has been drinking since then. His mother died of breast cancer. States she did not tell him how sick she was so when he was finally told she only had a month to live. He was not there when she died. He states that he has lost his job, his place of residence, so he is homeless, has no resources and the only thing he does is to drink in order not to think. Diagnosis:   DSM5:  Substance/Addictive Disorders:  Alcohol Related Disorder - Severe (303.90) Depressive Disorders:  Major Depressive Disorder - Severe (296.23) Total Time spent with patient: 30 minutes  Axis I: Substance Induced Mood Disorder  ADL's:  Intact  Sleep: Fair  Appetite:  Fair  Psychiatric Specialty Exam: Physical Exam  Review of Systems  Constitutional: Positive for malaise/fatigue.  HENT: Negative.   Eyes: Negative.   Respiratory: Negative.   Cardiovascular: Negative.   Gastrointestinal: Positive for nausea and abdominal pain.  Genitourinary: Negative.   Musculoskeletal: Negative.   Skin: Negative.   Neurological: Positive for weakness.  Endo/Heme/Allergies: Negative.   Psychiatric/Behavioral: Positive for depression and substance abuse. The patient is nervous/anxious.     Blood pressure 124/93, pulse 69, temperature 98.4 F (36.9 C), temperature source Oral, resp. rate 16, height 5' 11.5" (1.816 m), weight 147.419 kg (325 lb), SpO2 97.00%.Body mass index is 44.7 kg/(m^2).  General Appearance: Fairly Groomed  Patent attorney::  Fair  Speech:  Clear and Coherent  Volume:  Decreased  Mood:  Anxious and Depressed  Affect:  Restricted  Thought Process:  Coherent and Goal Directed  Orientation:  Oriented  X 3  Thought Content:  events worries concerns  Suicidal Thoughts:  No  Homicidal Thoughts:  No  Memory:  Immediate;   Fair Recent;   Fair Remote;   Fair  Judgement:  Fair  Insight:  Present and Shallow  Psychomotor Activity:  Restlessness  Concentration:  Fair  Recall:  [employer] of Knowledge:NA  Language: Fair  Akathisia:  No  Handed:    AIMS (if indicated):     Assets:  Desire for Improvement  Sleep:  Number of Hours: 6.75   Musculoskeletal: Strength & Muscle Tone: within normal limits Gait & Station: normal Patient leans: N/A  Current Medications: Current Facility-Administered Medications  Medication Dose Route Frequency Provider Last Rate Last Dose  . acetaminophen (TYLENOL) tablet 650 mg  650 mg Oral Q4H PRN Nanine Means, NP      . alum & mag hydroxide-simeth (MAALOX/MYLANTA) 200-200-20 MG/5ML suspension 30 mL  30 mL Oral PRN Nanine Means, NP      . alum & mag hydroxide-simeth (MAALOX/MYLANTA) 200-200-20 MG/5ML suspension 30 mL  30 mL Oral Q4H PRN Nanine Means, NP      . atenolol (TENORMIN) tablet 50 mg  50 mg Oral Daily Court Joy, PA-C   50 mg at 04/24/14 0816  . chlordiazePOXIDE (LIBRIUM) capsule 25 mg  25 mg Oral Q6H PRN Nanine Means, NP      . chlordiazePOXIDE (LIBRIUM) capsule 25 mg  25 mg Oral BH-qamhs Nanine Means, NP       Followed by  . [START ON 04/26/2014] chlordiazePOXIDE (LIBRIUM) capsule 25 mg  25 mg Oral Daily Nanine Means, NP      . cloNIDine (CATAPRES) tablet 0.1 mg  0.1 mg Oral BID Oletta Darter, MD   0.1 mg at 04/24/14 0816  . hydrOXYzine (ATARAX/VISTARIL) tablet 25 mg  25 mg Oral Q6H PRN Nanine Means, NP   25 mg at 04/23/14 2031  . ibuprofen (ADVIL,MOTRIN) tablet 600 mg  600 mg Oral Q8H PRN Nanine Means, NP      . lisinopril (PRINIVIL,ZESTRIL) tablet 10 mg  10 mg Oral Daily Sanjuana Kava, NP   10 mg at 04/24/14 0816  . loperamide (IMODIUM) capsule 2-4 mg  2-4 mg Oral PRN Nanine Means, NP      . magnesium hydroxide (MILK OF MAGNESIA) suspension  30 mL  30 mL Oral Daily PRN Nanine Means, NP      . multivitamin with minerals tablet 1 tablet  1 tablet Oral Daily Nanine Means, NP   1 tablet at 04/24/14 0816  . nicotine (NICODERM CQ - dosed in mg/24 hours) patch 21 mg  21 mg Transdermal Daily Nanine Means, NP      . ondansetron (ZOFRAN) tablet 4 mg  4 mg Oral Q8H PRN Nanine Means, NP      . ondansetron (ZOFRAN-ODT) disintegrating tablet 4 mg  4 mg Oral Q6H PRN Nanine Means, NP      . sertraline (ZOLOFT) tablet 50 mg  50 mg Oral Daily Oletta Darter, MD   50 mg at 04/24/14 0816  . thiamine (B-1) injection 100 mg  100 mg Intramuscular Once Nanine Means, NP      . thiamine (VITAMIN B-1) tablet 100 mg  100 mg Oral Daily Nanine Means, NP   100 mg at 04/24/14 0816  . traZODone (DESYREL) tablet 50 mg  50 mg Oral QHS Court Joy, PA-C   50 mg at 04/23/14 2113    Lab Results: No results found for this or any previous visit (from the past 48 hour(s)).  Physical Findings: AIMS: Facial and Oral Movements Muscles of Facial Expression: None, normal Lips and Perioral Area: None, normal Jaw: None, normal Tongue: None, normal,Extremity Movements Upper (arms, wrists, hands, fingers): None, normal Lower (legs, knees, ankles, toes): None, normal, Trunk Movements Neck, shoulders, hips: None, normal, Overall Severity Severity of abnormal movements (highest score from questions above): None, normal Incapacitation due to abnormal movements: None, normal Patient's awareness of abnormal movements (rate only patient's report): No Awareness, Dental Status Current problems with teeth and/or dentures?: No Does patient usually wear dentures?: No  CIWA:  CIWA-Ar Total: 2 COWS:     Treatment Plan Summary: Daily contact with patient to assess and evaluate symptoms and progress in treatment Medication management  Plan: Supportive approach/coping skills/relapse prevention           Pursue detox           Reassess and address the co morbidities            Address the nausea Medical Decision Making Problem Points:  Review of psycho-social stressors (1) Data Points:  Review of medication regiment & side effects (2)  I certify that inpatient services furnished can reasonably be expected to improve the patient's condition.   March Joos A 04/24/2014, 3:28 PM

## 2014-04-24 NOTE — Progress Notes (Signed)
Pt attended spiritual care group on grief and loss facilitated by chaplain Marialena Wollen   Group opened with brief discussion and psycho-social ed around grief and loss in relationships and in relation to self - identifying life patterns, circumstances, changes that cause losses. Established group norm of speaking from own life experience. Group goal of establishing open and affirming space for members to share loss and experience with grief, normalize grief experience and provide psycho social education and grief support.     

## 2014-04-25 DIAGNOSIS — F411 Generalized anxiety disorder: Secondary | ICD-10-CM

## 2014-04-25 DIAGNOSIS — F1994 Other psychoactive substance use, unspecified with psychoactive substance-induced mood disorder: Secondary | ICD-10-CM

## 2014-04-25 MED ORDER — QUETIAPINE FUMARATE 100 MG PO TABS
100.0000 mg | ORAL_TABLET | Freq: Every day | ORAL | Status: DC
  Administered 2014-04-25 – 2014-04-26 (×2): 100 mg via ORAL
  Filled 2014-04-25: qty 14
  Filled 2014-04-25 (×4): qty 1

## 2014-04-25 NOTE — Progress Notes (Signed)
[facility] MD Progress Note  04/25/2014 4:58 PM Ravin Denardo  MRN:  [id number] Subjective:  Still feeling very depressed. States that he does not want to involve any family members as he is ashamed of the situation he has gotten himself into. States that he wants to get himself together before he resumes contact with his family. He is having a very hard time with sleep as he has a hard time quieting his mind, continues to ruminate, worry trough the night.  Diagnosis:   DSM5: Substance/Addictive Disorders:  Alcohol Related Disorder - Severe (303.90) Depressive Disorders:  Major Depressive Disorder - Severe (296.23) Total Time spent with patient: 30 minutes  Axis I: Generalized Anxiety Disorder and Substance Induced Mood Disorder  ADL's:  Intact  Sleep: Poor  Appetite:  Fair   Psychiatric Specialty Exam: Physical Exam  Review of Systems  Constitutional: Positive for malaise/fatigue.  HENT: Negative.   Eyes: Negative.   Respiratory: Negative.   Cardiovascular: Negative.   Gastrointestinal: Negative.   Genitourinary: Negative.   Musculoskeletal: Positive for myalgias.  Skin: Negative.   Neurological: Positive for dizziness and weakness.  Endo/Heme/Allergies: Negative.   Psychiatric/Behavioral: Positive for depression and substance abuse. The patient is nervous/anxious.     Blood pressure 146/90, pulse 58, temperature 98.4 F (36.9 C), temperature source Oral, resp. rate 18, height 5' 11.5" (1.816 m), weight 147.419 kg (325 lb), SpO2 97.00%.Body mass index is 44.7 kg/(m^2).  General Appearance: Disheveled  Eye Solicitor::  Fair  Speech:  Clear and Coherent and Slow  Volume:  Decreased  Mood:  Anxious and Depressed  Affect:  Restricted  Thought Process:  Coherent and Goal Directed  Orientation:  Full (Time, Place, and Person)  Thought Content:  symptoms events worries concerns  Suicidal Thoughts:  No  Homicidal Thoughts:  No  Memory:  Immediate;   Fair Recent;   Fair Remote;    Fair  Judgement:  Fair  Insight:  Present  Psychomotor Activity:  Restlessness  Concentration:  Fair  Recall:  [employer] of Knowledge:NA  Language: Fair  Akathisia:  No  Handed:    AIMS (if indicated):     Assets:  Desire for Improvement  Sleep:  Number of Hours: 6.5   Musculoskeletal: Strength & Muscle Tone: within normal limits Gait & Station: normal Patient leans: N/A  Current Medications: Current Facility-Administered Medications  Medication Dose Route Frequency Provider Last Rate Last Dose  . acetaminophen (TYLENOL) tablet 650 mg  650 mg Oral Q4H PRN Nanine Means, NP      . alum & mag hydroxide-simeth (MAALOX/MYLANTA) 200-200-20 MG/5ML suspension 30 mL  30 mL Oral Q4H PRN Nanine Means, NP      . atenolol (TENORMIN) tablet 50 mg  50 mg Oral Daily Court Joy, PA-C   50 mg at 04/25/14 0755  . [START ON 04/26/2014] chlordiazePOXIDE (LIBRIUM) capsule 25 mg  25 mg Oral Daily Nanine Means, NP      . cloNIDine (CATAPRES) tablet 0.1 mg  0.1 mg Oral BID Oletta Darter, MD   0.1 mg at 04/25/14 1626  . ibuprofen (ADVIL,MOTRIN) tablet 600 mg  600 mg Oral Q8H PRN Nanine Means, NP      . lisinopril (PRINIVIL,ZESTRIL) tablet 10 mg  10 mg Oral Daily Sanjuana Kava, NP   10 mg at 04/25/14 0755  . magnesium hydroxide (MILK OF MAGNESIA) suspension 30 mL  30 mL Oral Daily PRN Nanine Means, NP      . multivitamin with minerals tablet 1 tablet  1 tablet Oral Daily Nanine Means, NP   1 tablet at 04/25/14 0755  . nicotine (NICODERM CQ - dosed in mg/24 hours) patch 21 mg  21 mg Transdermal Daily Nanine Means, NP      . sertraline (ZOLOFT) tablet 50 mg  50 mg Oral Daily Oletta Darter, MD   50 mg at 04/25/14 0755  . thiamine (B-1) injection 100 mg  100 mg Intramuscular Once Nanine Means, NP      . thiamine (VITAMIN B-1) tablet 100 mg  100 mg Oral Daily Nanine Means, NP   100 mg at 04/25/14 0754  . traZODone (DESYREL) tablet 50 mg  50 mg Oral QHS Court Joy, PA-C   50 mg at 04/24/14 2131     Lab Results: No results found for this or any previous visit (from the past 48 hour(s)).  Physical Findings: AIMS: Facial and Oral Movements Muscles of Facial Expression: None, normal Lips and Perioral Area: None, normal Jaw: None, normal Tongue: None, normal,Extremity Movements Upper (arms, wrists, hands, fingers): None, normal Lower (legs, knees, ankles, toes): None, normal, Trunk Movements Neck, shoulders, hips: None, normal, Overall Severity Severity of abnormal movements (highest score from questions above): None, normal Incapacitation due to abnormal movements: None, normal Patient's awareness of abnormal movements (rate only patient's report): No Awareness, Dental Status Current problems with teeth and/or dentures?: No Does patient usually wear dentures?: No  CIWA:  CIWA-Ar Total: 0 COWS:     Treatment Plan Summary: Daily contact with patient to assess and evaluate symptoms and progress in treatment Medication management  Plan:  Supportive approach/coping skills/relapse prevention            Continue detox            Trial with Seroquel 100 mg HS            Explore options for residential treatment              Medical Decision Making Problem Points:  Established problem, worsening (2) and Review of psycho-social stressors (1) Data Points:  Review of medication regiment & side effects (2) Review of new medications or change in dosage (2)  I certify that inpatient services furnished can reasonably be expected to improve the patient's condition.   Peter Daquila A 04/25/2014, 4:58 PM

## 2014-04-25 NOTE — Progress Notes (Signed)
Pt spent the first part of the shift in bed.  He did not attend evening group.  He reports he is having moderate ETOH withdrawal symptoms at this time.  Writer discussed pt's evening medications with him.  Pt denies SI/HI/AV.  Pt says he drinks to deal with his problems.  Pt was encouraged to make his needs known to staff.  Medicated as ordered.  Support and encouragement offered.  Safety maintained with q15 minute checks.

## 2014-04-25 NOTE — Progress Notes (Signed)
Adult Psychoeducational Group Note  Date:  04/25/2014 Time:  9:46 PM  Group Topic/Focus:  Wrap-Up Group:   The focus of this group is to help patients review their daily goal of treatment and discuss progress on daily workbooks.  Participation Level:  Active  Participation Quality:  Appropriate and Attentive  Affect:  Appropriate  Cognitive:  Alert and Appropriate  Insight: Appropriate and Good  Engagement in Group:  Engaged  Modes of Intervention:  Support  Additional Comments:  Pt was in group engaged in the discussion and said he feels a lot better with the detox and is ready to go and get his life together.   [name] Donovan R 04/25/2014, 9:46 PM

## 2014-04-25 NOTE — [facility] Suicide Risk Assessment (Signed)
[facility] INPATIENT:  Family/Significant Other Suicide Prevention Education  Suicide Prevention Education:  Education Completed; [name] Donovan, Sister, [phone]; has been identified by the patient as the family member/significant other with whom the patient will be residing, and identified as the person(s) who will aid the patient in the event of a mental health crisis (suicidal ideations/suicide attempt).  With written consent from the patient, the family member/significant other has been provided the following suicide prevention education, prior to the and/or following the discharge of the patient.  The suicide prevention education provided includes the following:  Suicide risk factors  Suicide prevention and interventions  National Suicide Hotline telephone number  [facility] assessment telephone number  [facility] Emergency Assistance 911  [facility] and/or Residential Mobile Crisis Unit telephone number  Request made of family/significant other to:  Remove weapons (e.g., guns, rifles, knives), all items previously/currently identified as safety concern.   Sister advised patient does not have access to weapons.     Remove drugs/medications (over-the-counter, prescriptions, illicit drugs), all items previously/currently identified as a safety concern.  The family member/significant other verbalizes understanding of the suicide prevention education information provided.  The family member/significant other agrees to remove the items of safety concern listed above.  Wynn Banker 04/25/2014, 8:43 AM

## 2014-04-25 NOTE — [facility] Group Notes (Signed)
[facility] LCSW Group Therapy      Feelings About Diagnosis 1:15 - 2:30 PM         04/25/2014    Type of Therapy:  Group Therapy  Participation Level:  Active  Participation Quality:  Appropriate  Affect:  Appropriate  Cognitive:  Alert and Appropriate  Insight:  Developing/Improving and Engaged  Engagement in Therapy:  Developing/Improving and Engaged  Modes of Intervention:  Discussion, Education, Exploration, Problem-Solving, Rapport Building, Support  Summary of Progress/Problems:  Patient actively participated in group. Patient discussed past and present diagnosis and the effects it has had on  life.  Patient talked about family and society being judgmental and the stigma associated with having a mental health diagnosis.  He talked about how he has had a long history of depression and has used a depressant "alcohol" to try to get better but it has pulled him further down.  Patient shared he has lost everything.  He stated he is open to getting the help he needs to have a better life.  Wynn Banker 04/25/2014

## 2014-04-25 NOTE — Clinical Social Work Note (Signed)
CSW met with patient following group to advise him of having an admission assessment appointment on Thursday, September 3.  Patient shared that if he is not accepted, he has no place to live.  He consented for CSW to contact [facility] regarding bed available.  He was given an admission date of September 29.  Patient to follow up with [facility] and if not accepted there, he will keep the appointment with [facility].  CSW to provide patient with contact information for [facility] for a place to stay until a bed is available. CSW spoke with Elberta Fortis at [place] and they do not have any beds available at this time.

## 2014-04-25 NOTE — Clinical Social Work Note (Signed)
CSW spoke with patient's sister who advised of family history of depression and Bipolar.  Sister advised she is an MD and that patient has a long history of uncontrolled blood pressure.  She also advised he has had years of alcohol dependence.  She is hopeful patient will be able to get into a residential treatment program.  She advised patient has been living out of his car.  She stated she is currently living with a friend due to her home being up for sale and being in the process of relocating.  She advised there is no family in the area with whom patient can live.

## 2014-04-25 NOTE — Tx Team (Signed)
Interdisciplinary Treatment Plan Update   Date Reviewed:  04/25/2014  Time Reviewed:  8:53 AM  Progress in Treatment:   Attending groups: Yes Participating in groups: Yes Taking medication as prescribed: Yes  Tolerating medication: Yes Family/Significant other contact made: Yes, collateral contact with sister. Patient understands diagnosis: Yes  Discussing patient identified problems/goals with staff: Yes Medical problems stabilized or resolved: Yes Denies suicidal/homicidal ideation: Yes Patient has not harmed self or others: Yes  For review of initial/current patient goals, please see plan of care.  Estimated Length of Stay:  2 days  Reasons for Continued Hospitalization:  Anxiety Depression Medication stabilization   New Problems/Goals identified:    Discharge Plan or Barriers:  Referral made to [facility]  Additional Comments:   [name] Donovan is a 43 year old male who presented voluntarily to [facility] with complaining of severe depression with suicidal thoughts. The patient had plan to aggravate dangerous people in the community to hurt him. He called EMS to be taken to [facility] to obtain help. The patient has several stressors of being evicted last week, a conflict with son, and worsening alcohol abuse. Patient states today during his psychiatric admission "I lost my mother three years ago. I am still depressed about her loss. My depression is ruining my life. I lost my job, car, relationship, and home. I just want to disappear. I accomplish this by drinking alcohol. I feel very hopeless. I have never actually taken any medications. I never liked them. A long time ago I was given a prescription but never took it. I want treatment. I had called [facility] but found out you need to be detoxed first. I have having many withdrawal symptoms. I also feel dizzy. But I'm not sure if that is because of my blood pressure. I do not have a Primary Care Provider. I feel very tired today. I am  ready to receive help." Patient was cooperative with the admission assessment. He denies other substance abuse beside alcohol and his UDS is negative. His alcohol level on admission was 343.    Attendees:  Patient:  04/25/2014 8:53 AM   Signature:  Sallyanne Havers, MD 04/25/2014 8:53 AM  Signature:  Geoffery Lyons, MD 04/25/2014 8:53 AM  Signature:  Roswell Miners, RN 04/25/2014 8:53 AM  Signature:   04/25/2014 8:53 AM  Signature:  Neill Loft RN 04/25/2014 8:53 AM  Signature:  Juline Patch, LCSW 04/25/2014 8:53 AM  Signature:  Belenda Cruise Drinkard, LCSW-A 04/25/2014 8:53 AM  Signature:  Leisa Lenz, Care Coordinator [facility] 04/25/2014 8:53 AM  Signature:  Santa Genera, LCSW Lead Social Worker  04/25/2014 8:53 AM  Signature: Leighton Parody, RN 04/25/2014  8:53 AM  Signature:   Onnie Boer, RN [facility] 04/25/2014  8:53 AM  Signature: 04/25/2014  8:53 AM    Scribe for Treatment Team:   Juline Patch,  04/25/2014 8:53 AM

## 2014-04-25 NOTE — Progress Notes (Signed)
Recreation Therapy Notes  Animal-Assisted Activity/Therapy (AAA/T) Program Checklist/Progress Notes Patient Eligibility Criteria Checklist & Daily Group note for Rec Tx Intervention  Date: 09.01.2015 Time: 2:45pm Location: [address] Dayroom   AAA/T Program Assumption of Risk Form signed by Patient/ or Parent Legal Guardian yes  Patient is free of allergies or sever asthma yes  Patient reports no fear of animals yes  Patient reports no history of cruelty to animals yes   Patient understands his/her participation is voluntary yes  Behavioral Response: Did not attend.   Aryan Sparks L Lynanne Delgreco, LRT/CTRS        Shristi Scheib L 04/25/2014 5:08 PM 

## 2014-04-25 NOTE — Progress Notes (Signed)
D: Patient denies SI/HI and A/V hallucinations; patient reports sleep is poor and that it was off and on for sleep; patient reports that he did take something for to help him sleep; reports appetite is good; reports energy level is low ; reports ability to pay attention is poor; rates depression as 8/10; rates hopelessness 8/10; rates anxiety as 10/10; patient reports lightheadedness, headaches and pain; patient reports per self inventory " Im homeless and penniless"; patient reports goal for today is to participate in the activities of the unit  A: Monitored q 15 minutes; patient encouraged to attend groups; patient educated about medications; patient given medications per physician orders; patient encouraged to express feelings and/or concerns  R: Patient is flat, sad, and depressed, patient is cooperative; patient is participating in groups and patient engaged some during the group and vocalized his goal for today

## 2014-04-25 NOTE — Progress Notes (Signed)
The focus of this group is to educate the patient on the purpose and policies of crisis stabilization and provide a format to answer questions about their admission.  The group details unit policies and expectations of patients while admitted. Patient attended this group. 

## 2014-04-26 MED ORDER — SERTRALINE HCL 50 MG PO TABS: 50.0000 mg | ORAL_TABLET | Freq: Every day | ORAL | Status: DC

## 2014-04-26 MED ORDER — ATENOLOL 50 MG PO TABS: 50.0000 mg | ORAL_TABLET | Freq: Every day | ORAL | Status: DC

## 2014-04-26 MED ORDER — CLONIDINE HCL 0.1 MG PO TABS: 0.1000 mg | ORAL_TABLET | Freq: Two times a day (BID) | ORAL | Status: DC

## 2014-04-26 MED ORDER — TRAZODONE HCL 50 MG PO TABS: 50.0000 mg | ORAL_TABLET | Freq: Every day | ORAL | Status: DC

## 2014-04-26 MED ORDER — LISINOPRIL 10 MG PO TABS: 10.0000 mg | ORAL_TABLET | Freq: Every day | ORAL | Status: DC

## 2014-04-26 MED ORDER — QUETIAPINE FUMARATE 100 MG PO TABS: 100.0000 mg | ORAL_TABLET | Freq: Every day | ORAL | Status: DC

## 2014-04-26 NOTE — Progress Notes (Signed)
Adult Psychoeducational Group Note  Date:  04/26/2014 Time:  10:00am Group Topic/Focus:  Personal Choices and Values:   The focus of this group is to help patients assess and explore the importance of values in their lives, how their values affect their decisions, how they express their values and what opposes their expression.  Participation Level:  Active  Participation Quality:  Appropriate and Attentive  Affect:  Appropriate  Cognitive:  Alert and Appropriate  Insight: Appropriate  Engagement in Group:  Engaged  Modes of Intervention:  Discussion and Education  Additional Comments:   Pt attended and participated in group. Discussion was on personal development and the question was asked What does personal development mean to you? Pt stated always trying to improve oneself.  Shelly Bombard D 04/26/2014, 3:39 PM

## 2014-04-26 NOTE — [facility] Suicide Risk Assessment (Signed)
Suicide Risk Assessment  Discharge Assessment     Demographic Factors:  Male  Total Time spent with patient: 30 minutes  Psychiatric Specialty Exam:     Blood pressure 145/90, pulse 73, temperature 97.8 F (36.6 C), temperature source Oral, resp. rate 20, height 5' 11.5" (1.816 m), weight 147.419 kg (325 lb), SpO2 100.00%.Body mass index is 44.7 kg/(m^2).  General Appearance: Fairly Groomed  Patent attorney::  Fair  Speech:  Clear and Coherent  Volume:  Decreased  Mood:  worried  Affect:  Appropriate  Thought Process:  Coherent and Goal Directed  Orientation:  Full (Time, Place, and Person)  Thought Content:  Plans as he moves on, relapse prevention plan  Suicidal Thoughts:  No  Homicidal Thoughts:  No  Memory:  Immediate;   Fair Recent;   Fair Remote;   Fair  Judgement:  Fair  Insight:  Present  Psychomotor Activity:  Normal  Concentration:  Fair  Recall:  [employer] of Knowledge:NA  Language: Fair  Akathisia:  No  Handed:    AIMS (if indicated):     Assets:  Desire for Improvement  Sleep:  Number of Hours: 6.75    Musculoskeletal: Strength & Muscle Tone: within normal limits Gait & Station: normal Patient leans: N/A   Mental Status Per Nursing Assessment::   On Admission:     Current Mental Status by Physician: In full contact with reality. There are no active SI plans or intent. No active S/S of withdrawal. He is going to go to [facility] for an assessment. If [facility] would not have a bed, [facility] will be able to pick him up from there   Loss Factors: Decline in physical health  Historical Factors: NA  Risk Reduction Factors:   Sense of responsibility to family  Continued Clinical Symptoms:  Depression:   Comorbid alcohol abuse/dependence Alcohol/Substance Abuse/Dependencies  Cognitive Features That Contribute To Risk:  Closed-mindedness Polarized thinking Thought constriction (tunnel vision)    Suicide Risk:  Minimal: No identifiable suicidal  ideation.  Patients presenting with no risk factors but with morbid ruminations; may be classified as minimal risk based on the severity of the depressive symptoms  Discharge Diagnoses:   AXIS I:  Alcohol Dependence, S/p alcohol withdrawal, Major Depression recurrent  AXIS II:  No diagnosis AXIS III:   Past Medical History  Diagnosis Date  . Hypertension   . Depression    AXIS IV:  other psychosocial or environmental problems AXIS V:  61-70 mild symptoms  Plan Of Care/Follow-up recommendations:  Activity:  as tolerated Diet:  regular Follow up [facility] Is patient on multiple antipsychotic therapies at discharge:  No   Has Patient had three or more failed trials of antipsychotic monotherapy by history:  No  Recommended Plan for Multiple Antipsychotic Therapies: NA    Jaceyon Strole A 04/26/2014, 3:54 PM

## 2014-04-26 NOTE — [facility] Group Notes (Signed)
[facility] LCSW Group Therapy  Emotional Regulation 1:15 - 2: 30 PM        04/26/2014     Type of Therapy:  Group Therapy  Participation Level:  Appropriate  Participation Quality:  Appropriate  Affect:  Appropriate  Cognitive:  Attentive Appropriate  Insight:  Developing/Improving Engaged  Engagement in Therapy:  Developing/Improving Engaged  Modes of Intervention:  Discussion Exploration Problem-Solving Supportive  Summary of Progress/Problems:  Group topic was emotional regulations.  Patient participated in the discussion and was able to identify an emotion that needed to regulated.  Patient shared the emotion he has to control is love.  He stated he has learned that his drinking and abusing himself shows that he does not have the love for himself he needs. Patient was able to identify approprite coping skills including coming to the hospital and asking for help.  Wynn Banker 04/26/2014

## 2014-04-26 NOTE — [facility] Group Notes (Signed)
Adult Psychoeducational Group Note  Date:  04/26/2014 Time:  9:47 PM  Group Topic/Focus:  Wellness Toolbox:   The focus of this group is to discuss various aspects of wellness, balancing those aspects and exploring ways to increase the ability to experience wellness.  Patients will create a wellness toolbox for use upon discharge.  Participation Level:  Active  Participation Quality:  Appropriate  Affect:  Appropriate  Cognitive:  Appropriate  Insight: Appropriate  Engagement in Group:  Engaged  Modes of Intervention:  Discussion  Additional Comments:  Pt stated the had a rough start to his day and that his depression was a 8 but now it was an 2. Pt stated that he was getting discharged tomorrow and from here on out he wants to be honest with kids about whats going on with him. No SI/HI.  [name] Donovan A 04/26/2014, 9:47 PM

## 2014-04-26 NOTE — Plan of Care (Signed)
Problem: Alteration in mood & ability to function due to Goal: LTG-Patient demonstrates decreased signs of withdrawal Goal not met. Patient has a CIWA of eight. CIWA will be at zero prior to discharge.  Burnis Medin Hodnett, LCSW 04/24/2014    (Patient demonstrates decreased signs of withdrawal to the point the patient is safe to return home and continue treatment in an outpatient setting)  Outcome: Progressing Pt reports decreased signs of withdrawal AEB no c/o tremors or sweats.

## 2014-04-26 NOTE — Plan of Care (Signed)
Problem: Alteration in mood & ability to function due to Goal: STG: Patient verbalizes decreases in signs of withdrawal Outcome: Progressing Pt reports decreasing/mild withdrawal symptoms at this time.

## 2014-04-26 NOTE — Progress Notes (Signed)
D Pt. Denies SI and HI, depression and anxiety remain high due to the uncertainty of his discharge tomorrow.  A Writer offered support and encouragement, Discussed discharge plans with pt.  R Pt. Does have an appt. Tomorrow morning with [facility], pt. Also has a backup plan if that does not want.  Pt. Is hoping for a longterm treatment center.  Rates his depression, and anxiety at a 7.  Pt. Did report some hand pain after playing basketball which he received ibuprofen and an ice pack for relief.  Pt. Is presently resting. Pt. Remains safe on the unit.

## 2014-04-26 NOTE — Progress Notes (Signed)
[facility] MD Progress Note  04/26/2014 3:40 PM [name] Donovan  MRN:  [id number] Subjective:  [name] Donovan endorses that he slept better last night. He is worried about the possible outcome if [facility] would not have a bed for him tomorrow. He is worried, concerned but understands that he needs to go there in order to have the assessment in order to be considered for the bed Diagnosis:   DSM5: Substance/Addictive Disorders:  Alcohol Related Disorder - Severe (303.90) Depressive Disorders:  Major Depressive Disorder - Moderate (296.22) Total Time spent with patient: 30 minutes  Axis I: Substance Induced Mood Disorder  ADL's:  Intact  Sleep: Fair  Appetite:  Fair  Psychiatric Specialty Exam: Physical Exam  Review of Systems  Constitutional: Negative.   HENT: Negative.   Eyes: Negative.   Respiratory: Negative.   Cardiovascular: Negative.   Gastrointestinal: Negative.   Genitourinary: Negative.   Musculoskeletal: Negative.   Skin: Negative.   Neurological: Negative.   Endo/Heme/Allergies: Negative.   Psychiatric/Behavioral: Positive for depression and substance abuse. The patient is nervous/anxious and has insomnia.     Blood pressure 145/90, pulse 73, temperature 97.8 F (36.6 C), temperature source Oral, resp. rate 20, height 5' 11.5" (1.816 m), weight 147.419 kg (325 lb), SpO2 100.00%.Body mass index is 44.7 kg/(m^2).  General Appearance: Fairly Groomed  Patent attorney::  Fair  Speech:  Clear and Coherent  Volume:  Decreased  Mood:  Anxious and sad, worried  Affect:  Restricted  Thought Process:  Coherent and Goal Directed  Orientation:  Full (Time, Place, and Person)  Thought Content:  events symptoms worries and concerns  Suicidal Thoughts:  No  Homicidal Thoughts:  No  Memory:  Immediate;   Fair Recent;   Fair Remote;   Fair  Judgement:  Fair  Insight:  Present  Psychomotor Activity:  Restlessness  Concentration:  Fair  Recall:  [employer] of Knowledge:NA  Language:  Fair  Akathisia:  No  Handed:    AIMS (if indicated):     Assets:  Desire for Improvement  Sleep:  Number of Hours: 6.75   Musculoskeletal: Strength & Muscle Tone: within normal limits Gait & Station: normal Patient leans: N/A  Current Medications: Current Facility-Administered Medications  Medication Dose Route Frequency Provider Last Rate Last Dose  . acetaminophen (TYLENOL) tablet 650 mg  650 mg Oral Q4H PRN Nanine Means, NP      . alum & mag hydroxide-simeth (MAALOX/MYLANTA) 200-200-20 MG/5ML suspension 30 mL  30 mL Oral Q4H PRN Nanine Means, NP      . atenolol (TENORMIN) tablet 50 mg  50 mg Oral Daily Court Joy, PA-C   50 mg at 04/26/14 0819  . cloNIDine (CATAPRES) tablet 0.1 mg  0.1 mg Oral BID Oletta Darter, MD   0.1 mg at 04/26/14 0818  . ibuprofen (ADVIL,MOTRIN) tablet 600 mg  600 mg Oral Q8H PRN Nanine Means, NP      . lisinopril (PRINIVIL,ZESTRIL) tablet 10 mg  10 mg Oral Daily Sanjuana Kava, NP   10 mg at 04/26/14 0818  . magnesium hydroxide (MILK OF MAGNESIA) suspension 30 mL  30 mL Oral Daily PRN Nanine Means, NP      . multivitamin with minerals tablet 1 tablet  1 tablet Oral Daily Nanine Means, NP   1 tablet at 04/26/14 0819  . nicotine (NICODERM CQ - dosed in mg/24 hours) patch 21 mg  21 mg Transdermal Daily Nanine Means, NP      . QUEtiapine (SEROQUEL) tablet  100 mg  100 mg Oral QHS Rachael Fee, MD   100 mg at 04/25/14 2115  . sertraline (ZOLOFT) tablet 50 mg  50 mg Oral Daily Oletta Darter, MD   50 mg at 04/26/14 0818  . thiamine (B-1) injection 100 mg  100 mg Intramuscular Once Nanine Means, NP      . thiamine (VITAMIN B-1) tablet 100 mg  100 mg Oral Daily Nanine Means, NP   100 mg at 04/26/14 0818  . traZODone (DESYREL) tablet 50 mg  50 mg Oral QHS Court Joy, PA-C   50 mg at 04/25/14 2115    Lab Results: No results found for this or any previous visit (from the past 48 hour(s)).  Physical Findings: AIMS: Facial and Oral Movements Muscles of  Facial Expression: None, normal Lips and Perioral Area: None, normal Jaw: None, normal Tongue: None, normal,Extremity Movements Upper (arms, wrists, hands, fingers): None, normal Lower (legs, knees, ankles, toes): None, normal, Trunk Movements Neck, shoulders, hips: None, normal, Overall Severity Severity of abnormal movements (highest score from questions above): None, normal Incapacitation due to abnormal movements: None, normal Patient's awareness of abnormal movements (rate only patient's report): No Awareness, Dental Status Current problems with teeth and/or dentures?: No Does patient usually wear dentures?: No  CIWA:  CIWA-Ar Total: 3 COWS:     Treatment Plan Summary: Daily contact with patient to assess and evaluate symptoms and progress in treatment Medication management  Plan: Supportive approach/coping skills/relapse prevention           Facilitate admission to [facility] in the morning  Medical Decision Making Problem Points:  Review of psycho-social stressors (1) Data Points:  Review of medication regiment & side effects (2) Review of new medications or change in dosage (2)  I certify that inpatient services furnished can reasonably be expected to improve the patient's condition.   Janesa Dockery A 04/26/2014, 3:40 PM

## 2014-04-26 NOTE — [facility] Group Notes (Signed)
[facility] LCSW Aftercare Discharge Planning Group Note   04/26/2014 9:39 AM    Participation Quality:  Appropraite  Mood/Affect:  Appropriate  Depression Rating:  10  Anxiety Rating:  4  Thoughts of Suicide:  No  Will you contract for safety?   NA  Current AVH:  No  Plan for Discharge/Comments:  Patient attended discharge planning group and actively participated in group.  Patient shared he is hopeful to get into [facility] tomorrow but does not know what he will do if not accepted.  CSW provided all participants with daily workbook.   Transportation Means: Patient has transportation.   Supports:  Patient has a support system.   Nabilah Davoli, Joesph July

## 2014-04-26 NOTE — Discharge Summary (Signed)
Physician Discharge Summary Note  Patient:  [name] Donovan is an 43 y.o., male MRN:  [id number] DOB:  01-16-1971 Patient phone:  [phone] (home)  Patient address:   [place] [address],  Total Time spent with patient: Greater than 30 minutes  Date of Admission:  04/22/2014  Date of Discharge: 04/26/14  Reason for Admission:  Alcohol detox  Discharge Diagnoses: Active Problems:   Major depression   Alcohol dependence   Psychiatric Specialty Exam: Physical Exam  Psychiatric: His speech is normal and behavior is normal. Judgment and thought content normal. His mood appears not anxious. His affect is not angry. Cognition and memory are normal. He does not exhibit a depressed mood.    Review of Systems  Constitutional: Negative.   HENT: Negative.   Eyes: Negative.   Respiratory: Negative.   Cardiovascular: Negative.   Gastrointestinal: Negative.   Genitourinary: Negative.   Musculoskeletal: Negative.   Skin: Negative.   Neurological: Negative.   Endo/Heme/Allergies: Negative.   Psychiatric/Behavioral: Positive for depression (Stable) and substance abuse (Alcohol dependence). Negative for suicidal ideas, hallucinations and memory loss. The patient has insomnia (Stable). The patient is not nervous/anxious.     Blood pressure 145/90, pulse 73, temperature 97.8 F (36.6 C), temperature source Oral, resp. rate 20, height 5' 11.5" (1.816 m), weight 147.419 kg (325 lb), SpO2 100.00%.Body mass index is 44.7 kg/(m^2).   General Appearance: Fairly Groomed   Patent attorney:: Fair   Speech: Clear and Coherent   Volume: Decreased   Mood: worried   Affect: Appropriate   Thought Process: Coherent and Goal Directed   Orientation: Full (Time, Place, and Person)   Thought Content: Plans as he moves on, relapse prevention plan   Suicidal Thoughts: No   Homicidal Thoughts: No   Memory: Immediate; Fair  Recent; Fair  Remote; Fair   Judgement: Fair   Insight: Present    Psychomotor Activity: Normal   Concentration: Fair   Recall: [employer] of Knowledge:NA   Language: Fair   Akathisia: No   Handed:   AIMS (if indicated):   Assets: Desire for Improvement   Sleep: Number of Hours: 6.75    Past Psychiatric History: Diagnosis: Alcohol dependence, major depression  Hospitalizations: [facility] adult unit  Outpatient Care:  Substance Abuse Care: [facility], [facility], [facility]  Self-Mutilation: NA  Suicidal Attempts: NA  Violent Behaviors: NA   Musculoskeletal: Strength & Muscle Tone: within normal limits Gait & Station: normal Patient leans: N/A  DSM5: Schizophrenia Disorders:  NA Obsessive-Compulsive Disorders:  NA Trauma-Stressor Disorders:  NA Substance/Addictive Disorders:  Alcohol Related Disorder - Severe (303.90) Depressive Disorders:  Major Depressive Disorder - Moderate (296.22)  Axis Diagnosis:   AXIS I:  Alcohol dependence, major depression AXIS II:  Deferred AXIS III:   Past Medical History  Diagnosis Date  . Hypertension   . Depression    AXIS IV:  other psychosocial or environmental problems and Alcoholism, chronic AXIS V:  63  Level of Care:  RTC  Hospital Course:  [name] Donovan is a 43 year old male who presented voluntarily to [facility] with complaining of severe depression with suicidal thoughts. The patient had plan to aggravate dangerous people in the community to hurt him.   [name] Donovan was admitted to the hospital with a blood alcohol level of 343 per toxicology tests reports. He was intoxicated, depressed with suicidal ideations. [name] Donovan was in deed of alcohol detoxification as well as mood stabilization treatments. His alcohol detox was achieved using  [facility] detox protocols. And for mood stabilization treatments, he was medicated and discharged on Seroquel 100 mg Q bedtime for mood control, Sertraline 50 mg daily for depression and Trazodone 50 mg Q bedtime for sleep. [name] Donovan was also enrolled and  participated in the group counseling sessions and AA/NA meetings being offered and held on this unit. He learned coping skills. He was also resumed on his pertinent home medications for his other pre-existing medical issues. He tolerated his treatment regimen without any adverse effects and or reactions.  [name] Donovan has completed detox treatment and his mood is stable. While a patient in this hospital, he was motivated for recovery. He worked closely with the treatment team and case managers to develop a discharge plan with appropriate goals to maintain stable mood and sobriety. Coping skills, problem solving as well as relaxation therapies were also part of the unit programming. On the day of discharge, [name] Donovan was in much improved condition than upon admission. His symptoms were reported as significantly decreased or resolved completely. Upon discharge, he adamantly denies any SI/HI, AVH, delusional thoughts, withdrawal symptoms and or paranoia. He was motivated to continue taking medication with a goal of continued improvement in mental health.    He is currently being discharged to continue substance abuse treatment at the [facility] treatment [facility]. He has [facility] referral as well in the event that he could not get into the [facility] residential. He was provided with a 14 days worth, supply samples of his [facility] discharge medications. He left [facility] with all personal belonging in no apparent distress. Transportation per city bus. [facility] provided bus pass.   Consults:  psychiatry  Significant Diagnostic Studies:  labs: CBC with diff, CMP, UDS, toxicology tests, U/A  Discharge Vitals:   Blood pressure 145/90, pulse 73, temperature 97.8 F (36.6 C), temperature source Oral, resp. rate 20, height 5' 11.5" (1.816 m), weight 147.419 kg (325 lb), SpO2 100.00%. Body mass index is 44.7 kg/(m^2). Lab Results:   No results found for this or any previous visit (from the past 72 hour(s)).  Physical  Findings: AIMS: Facial and Oral Movements Muscles of Facial Expression: None, normal Lips and Perioral Area: None, normal Jaw: None, normal Tongue: None, normal,Extremity Movements Upper (arms, wrists, hands, fingers): None, normal Lower (legs, knees, ankles, toes): None, normal, Trunk Movements Neck, shoulders, hips: None, normal, Overall Severity Severity of abnormal movements (highest score from questions above): None, normal Incapacitation due to abnormal movements: None, normal Patient's awareness of abnormal movements (rate only patient's report): No Awareness, Dental Status Current problems with teeth and/or dentures?: No Does patient usually wear dentures?: No  CIWA:  CIWA-Ar Total: 3 COWS:     Psychiatric Specialty Exam: See Psychiatric Specialty Exam and Suicide Risk Assessment completed by Attending Physician prior to discharge.  Discharge destination:  [facility]  Is patient on multiple antipsychotic therapies at discharge:  No   Has Patient had three or more failed trials of antipsychotic monotherapy by history:  No  Recommended Plan for Multiple Antipsychotic Therapies: NA    Medication List       Indication   atenolol 50 MG tablet  Commonly known as:  TENORMIN  Take 1 tablet (50 mg total) by mouth daily. For high blood pressure   Indication:  High Blood Pressure     cloNIDine 0.1 MG tablet  Commonly known as:  CATAPRES  Take 1 tablet (0.1 mg total) by mouth 2 (two) times daily. For high blood pressure   Indication:  High  Blood Pressure     lisinopril 10 MG tablet  Commonly known as:  PRINIVIL,ZESTRIL  Take 1 tablet (10 mg total) by mouth daily. For high blood pressure   Indication:  High Blood Pressure     QUEtiapine 100 MG tablet  Commonly known as:  SEROQUEL  Take 1 tablet (100 mg total) by mouth at bedtime. For mood control   Indication:  Mood control     sertraline 50 MG tablet  Commonly known as:  ZOLOFT  Take 1 tablet (50 mg total)  by mouth daily. For depression   Indication:  Major Depressive Disorder     traZODone 50 MG tablet  Commonly known as:  DESYREL  Take 1 tablet (50 mg total) by mouth at bedtime. Sleep   Indication:  Trouble Sleeping       Follow-up Information   Follow up with [facility] On 04/27/2014. (Thursday, April 27, 2014 at [facility] for an admission assessment)    Contact information:   [address] [place], [place]   [address]  [phone]      Follow up with [facility] On 05/23/2014. (Tuesday May 23, 2014 by 3PM.  If you are accepted for residential treatment at [facility] please call to cancel this appointment.)    Contact information:   [address] [place], [place]   [address]  [phone]      Follow up with [facility] On 04/27/2014. (Please call [facility] at [phone] if not accepted to [facility])    Contact information:   [address] [place], [place]   [address]  [phone]     Follow-up recommendations:  Activity:  As tolerated Diet: As recommended by your primary care doctor. Keep all scheduled follow-up appointments as recommended.  Comments:  Take all your medications as prescribed by your mental healthcare provider. Report any adverse effects and or reactions from your medicines to your outpatient provider promptly. Patient is instructed and cautioned to not engage in alcohol and or illegal drug use while on prescription medicines. In the event of worsening symptoms, patient is instructed to call the crisis hotline, 911 and or go to the nearest ED for appropriate evaluation and treatment of symptoms. Follow-up with your primary care provider for your other medical issues, concerns and or health care needs.  Total Discharge Time:  Greater than 30 minutes.  Signed: Sanjuana Kava, PMHNP 04/26/2014, 3:12 PM I personally assessed the patient and formulated the plan Madie Reno A. Dub Mikes, M.D.

## 2014-04-26 NOTE — Progress Notes (Signed)
Pt observed in his room in bed early in the shift.  When RN asked why he was in bed, he said he was just resting, but that he was feeling much better.  He said his withdrawal symptoms were decreased.  He denies SI/HI/AV.  He is hopeful to get into [facility] on Thursday.  He said he has been discussing his options with the CSW.  He is hopeful that by going to long term treatment, he will be able to get his life together.  Pt has been pleasant/appropriate with staff and peers.  Pt makes his needs known to staff.  Support and encouragement offered.  Safety maintained with q15 minute checks.

## 2014-04-26 NOTE — Progress Notes (Signed)
Patient ID: [name] Donovan, male   DOB: [birth year], 43 y.o.   MRN: [id number] D: Client in dayroom interacting with peers, reports he had a "better day" depression at "5" of 10. "I got to talk to my four year old today and her mom that made me feel better."  Client expresses concerns that [facility] may not have an available bed, but remains hopeful and plans to to to [facility] if not and then to LTC. A: Writer introduced self to client, provided emotional support, encouraged him to move forward with plans for complete recovery. Staff will monitor q75min for safety. R: Client is safe on the unit, attended group.

## 2014-04-27 NOTE — Progress Notes (Signed)
Patient ID: [name] Donovan, male   DOB: [birth year], 43 y.o.   MRN: [id number] D: Client pleasant, optimistic about recovery, denies SHI. A: Writer reviewed discharged instructions, client agreed to instructions. R: Client walked out and given directions to the bus stop.

## 2014-05-02 NOTE — Progress Notes (Signed)
Patient Discharge Instructions:  After Visit Summary (AVS):   Faxed to:  05/02/14 Discharge Summary Note:   Faxed to:  05/02/14 Psychiatric Admission Assessment Note:   Faxed to:  05/02/14 Suicide Risk Assessment - Discharge Assessment:   Faxed to:  05/02/14 Faxed/Sent to the Next Level Care provider:  05/02/14 Faxed to [facility] @ [phone] Faxed to [facility] @ [phone] Faxed to [facility] @ [phone]   Jerelene Redden, 05/02/2014, 1:20 PM

## 2014-05-17 ENCOUNTER — Emergency Department (HOSPITAL_COMMUNITY)
Admission: EM | Admit: 2014-05-17 | Discharge: 2014-05-22 | Disposition: A | Discharge status: Home / Self Care | Payer: Self-pay | Attending: Emergency Medicine | Admitting: Emergency Medicine

## 2014-05-17 ENCOUNTER — Encounter (HOSPITAL_COMMUNITY): Payer: Self-pay | Admitting: Emergency Medicine

## 2014-05-17 DIAGNOSIS — F1010 Alcohol abuse, uncomplicated: Secondary | ICD-10-CM | POA: Insufficient documentation

## 2014-05-17 DIAGNOSIS — F329 Major depressive disorder, single episode, unspecified: Secondary | ICD-10-CM | POA: Insufficient documentation

## 2014-05-17 DIAGNOSIS — F332 Major depressive disorder, recurrent severe without psychotic features: Secondary | ICD-10-CM

## 2014-05-17 DIAGNOSIS — F17200 Nicotine dependence, unspecified, uncomplicated: Secondary | ICD-10-CM | POA: Insufficient documentation

## 2014-05-17 DIAGNOSIS — R45851 Suicidal ideations: Secondary | ICD-10-CM | POA: Insufficient documentation

## 2014-05-17 DIAGNOSIS — Z880 Allergy status to penicillin: Secondary | ICD-10-CM | POA: Insufficient documentation

## 2014-05-17 DIAGNOSIS — F3289 Other specified depressive episodes: Secondary | ICD-10-CM | POA: Insufficient documentation

## 2014-05-17 DIAGNOSIS — Z79899 Other long term (current) drug therapy: Secondary | ICD-10-CM | POA: Insufficient documentation

## 2014-05-17 DIAGNOSIS — F10230 Alcohol dependence with withdrawal, uncomplicated: Secondary | ICD-10-CM | POA: Diagnosis present

## 2014-05-17 DIAGNOSIS — I10 Essential (primary) hypertension: Secondary | ICD-10-CM | POA: Insufficient documentation

## 2014-05-17 LAB — COMPREHENSIVE METABOLIC PANEL
ALBUMIN: 3.7 g/dL (ref 3.5–5.2)
ALK PHOS: 99 U/L (ref 39–117)
ALT: 25 U/L (ref 0–53)
ANION GAP: 17 — AB (ref 5–15)
AST: 33 U/L (ref 0–37)
BILIRUBIN TOTAL: 0.2 mg/dL — AB (ref 0.3–1.2)
BUN: 12 mg/dL (ref 6–23)
CHLORIDE: 97 meq/L (ref 96–112)
CO2: 22 mEq/L (ref 19–32)
Calcium: 8.3 mg/dL — ABNORMAL LOW (ref 8.4–10.5)
Creatinine, Ser: 1.38 mg/dL — ABNORMAL HIGH (ref 0.50–1.35)
GFR calc non Af Amer: 62 mL/min — ABNORMAL LOW (ref >90)
GFR, EST AFRICAN AMERICAN: 72 mL/min — AB (ref >90)
GLUCOSE: 98 mg/dL (ref 70–99)
POTASSIUM: 4.4 meq/L (ref 3.7–5.3)
SODIUM: 136 meq/L — AB (ref 137–147)
TOTAL PROTEIN: 7.7 g/dL (ref 6.0–8.3)

## 2014-05-17 LAB — CBC
HCT: 42.6 % (ref 39.0–52.0)
Hemoglobin: 14.6 g/dL (ref 13.0–17.0)
MCH: 24.3 pg — ABNORMAL LOW (ref 26.0–34.0)
MCHC: 34.3 g/dL (ref 30.0–36.0)
MCV: 70.9 fL — ABNORMAL LOW (ref 78.0–100.0)
Platelets: 294 10*3/uL (ref 150–400)
RBC: 6.01 MIL/uL — ABNORMAL HIGH (ref 4.22–5.81)
RDW: 14.7 % (ref 11.5–15.5)
WBC: 7.6 10*3/uL (ref 4.0–10.5)

## 2014-05-17 LAB — ACETAMINOPHEN LEVEL

## 2014-05-17 LAB — ETHANOL: Alcohol, Ethyl (B): 296 mg/dL — ABNORMAL HIGH (ref 0–11)

## 2014-05-17 LAB — SALICYLATE LEVEL: Salicylate Lvl: <2 mg/dL — ABNORMAL LOW (ref 2.8–20.0)

## 2014-05-17 MED ORDER — LORAZEPAM 1 MG PO TABS
0.0000 mg | ORAL_TABLET | Freq: Four times a day (QID) | ORAL | Status: AC
  Administered 2014-05-18 (×3): 2 mg via ORAL
  Administered 2014-05-19: 1 mg via ORAL
  Administered 2014-05-19: 2 mg via ORAL
  Administered 2014-05-19: 1 mg via ORAL
  Filled 2014-05-17 (×4): qty 2
  Filled 2014-05-17 (×2): qty 1

## 2014-05-17 MED ORDER — LORAZEPAM 1 MG PO TABS: 0.0000 mg | ORAL_TABLET | Freq: Two times a day (BID) | ORAL | Status: AC

## 2014-05-17 MED ORDER — VITAMIN B-1 100 MG PO TABS
100.0000 mg | ORAL_TABLET | Freq: Every day | ORAL | Status: DC
  Administered 2014-05-18 – 2014-05-22 (×5): 100 mg via ORAL
  Filled 2014-05-17 (×5): qty 1

## 2014-05-17 MED ORDER — THIAMINE HCL 100 MG/ML IJ SOLN
100.0000 mg | Freq: Every day | INTRAMUSCULAR | Status: DC
  Filled 2014-05-17: qty 2

## 2014-05-17 NOTE — ED Notes (Signed)
Per Dr Romeo Apple, patient will be IVC'd. Security requested to triage area to monitor for elopement as patient states he does not wish to stay.

## 2014-05-17 NOTE — ED Provider Notes (Signed)
CSN: [id number]     Arrival date & time 05/17/14  2130 History   First MD Initiated Contact with Patient 05/17/14 2253     Chief Complaint  Patient presents with  . Suicidal     (Consider location/radiation/quality/duration/timing/severity/associated sxs/prior Treatment) Patient is a 43 y.o. male presenting with mental health disorder. The history is provided by the patient.  Mental Health Problem Presenting symptoms: suicidal thoughts and suicidal threats   Degree of incapacity (severity):  Moderate Onset quality:  Gradual Duration:  1 month Timing:  Constant Progression:  Worsening Chronicity:  Recurrent Context: alcohol use   Treatment compliance:  Some of the time Time since last dose of psychoactive medication: unsure. Relieved by:  Nothing Worsened by:  Nothing tried Ineffective treatments:  None tried Associated symptoms: no abdominal pain, no chest pain and no headaches     Past Medical History  Diagnosis Date  . Hypertension   . Depression    History reviewed. No pertinent past surgical history. Family History  Problem Relation Age of Onset  . Hypertension Mother   . Heart disease Father   . Heart disease Brother    History  Substance Use Topics  . Smoking status: Current Every Day Smoker    Types: Cigarettes  . Smokeless tobacco: Not on file  . Alcohol Use: Yes    Review of Systems  Constitutional: Negative for fever.  HENT: Negative for drooling and rhinorrhea.   Eyes: Negative for pain.  Respiratory: Negative for cough and shortness of breath.   Cardiovascular: Negative for chest pain and leg swelling.  Gastrointestinal: Negative for nausea, vomiting, abdominal pain and diarrhea.  Genitourinary: Negative for dysuria and hematuria.  Musculoskeletal: Negative for gait problem and neck pain.  Skin: Negative for color change.  Neurological: Negative for numbness and headaches.  Hematological: Negative for adenopathy.  Psychiatric/Behavioral:  Positive for suicidal ideas and dysphoric mood.  All other systems reviewed and are negative.     Allergies  Penicillins  Home Medications   Prior to Admission medications   Medication Sig Start Date End Date Taking? Authorizing Provider  amLODipine (NORVASC) 10 MG tablet Take 10 mg by mouth daily.   Yes Historical Provider, MD  hydrochlorothiazide (HYDRODIURIL) 25 MG tablet Take 25 mg by mouth daily.   Yes Historical Provider, MD  sertraline (ZOLOFT) 50 MG tablet Take 1 tablet (50 mg total) by mouth daily. For depression 04/26/14  Yes Sanjuana Kava, NP  traZODone (DESYREL) 50 MG tablet Take 1 tablet (50 mg total) by mouth at bedtime. Sleep 04/26/14  Yes Sanjuana Kava, NP   BP 156/79  Pulse 105  Temp(Src) 98.4 F (36.9 C) (Oral)  Resp 18  SpO2 96% Physical Exam  Nursing note and vitals reviewed. Constitutional: He is oriented to person, place, and time. He appears well-developed and well-nourished.  HENT:  Head: Normocephalic and atraumatic.  Right Ear: External ear normal.  Left Ear: External ear normal.  Nose: Nose normal.  Mouth/Throat: Oropharynx is clear and moist. No oropharyngeal exudate.  Eyes: Conjunctivae and EOM are normal. Pupils are equal, round, and reactive to light.  Neck: Normal range of motion. Neck supple.  Cardiovascular: Normal rate, regular rhythm, normal heart sounds and intact distal pulses.  Exam reveals no gallop and no friction rub.   No murmur heard. Pulmonary/Chest: Effort normal and breath sounds normal. No respiratory distress. He has no wheezes.  Abdominal: Soft. Bowel sounds are normal. He exhibits no distension. There is no tenderness. There is  no rebound and no guarding.  Musculoskeletal: Normal range of motion. He exhibits no edema and no tenderness.  Neurological: He is alert and oriented to person, place, and time.  Mildly intoxicated.  Skin: Skin is warm and dry.  Psychiatric: He exhibits a depressed mood.    ED Course  Procedures  (including critical care time) Labs Review Labs Reviewed  CBC - Abnormal; Notable for the following:    RBC 6.01 (*)    MCV 70.9 (*)    MCH 24.3 (*)    All other components within normal limits  COMPREHENSIVE METABOLIC PANEL - Abnormal; Notable for the following:    Sodium 136 (*)    Creatinine, Ser 1.38 (*)    Calcium 8.3 (*)    Total Bilirubin 0.2 (*)    GFR calc non Af Amer 62 (*)    GFR calc Af Amer 72 (*)    Anion gap 17 (*)    All other components within normal limits  ETHANOL - Abnormal; Notable for the following:    Alcohol, Ethyl (B) 296 (*)    All other components within normal limits  SALICYLATE LEVEL - Abnormal; Notable for the following:    Salicylate Lvl <2.0 (*)    All other components within normal limits  ACETAMINOPHEN LEVEL  URINE RAPID DRUG SCREEN (HOSP PERFORMED)    Imaging Review No results found.   EKG Interpretation None      MDM   Final diagnoses:  Suicidal ideations    11:20 PM 42 y.o. male w/ a hx of HTN and depression who presents with suicidal ideations. He reports that he asked an acquaintance today for some rope with the intention to hang himself. This acquaintance apparently called EMS and the patient was brought to the hospital. He was seen here approximately one month ago and noted to be [facility] for suicidal ideations. He states that he has been suicidal since discharge. He states that he drinks daily. He is unsure when his last drink was. He appears mildly intoxicated. He is afebrile and mildly tachycardic on triage vital signs. He would like to leave. Will fill out IVC paperwork and place him in psych hold.   Pt medically cleared. TTS consulted.     Purvis Sheffield, MD 05/18/14 [phone]

## 2014-05-17 NOTE — ED Notes (Signed)
Pt states he is suicidal  Pt states he was here about a month ago for same  Pt states his father died when he was 50 and his mother died 3 years ago on his fathers birthday   Pt states he has been on his own since then  Pt states he is tired  Pt states he asked someone for some rope today and they called the police on him  Pt states he was going to go out into the woods and hang himself from a tree  Pt is tearful in triage

## 2014-05-18 ENCOUNTER — Encounter (HOSPITAL_COMMUNITY): Payer: Self-pay | Admitting: *Deleted

## 2014-05-18 LAB — RAPID URINE DRUG SCREEN, HOSP PERFORMED
Amphetamines: NOT DETECTED
BARBITURATES: NOT DETECTED
Benzodiazepines: NOT DETECTED
COCAINE: NOT DETECTED
Opiates: NOT DETECTED
TETRAHYDROCANNABINOL: NOT DETECTED

## 2014-05-18 MED ORDER — LISINOPRIL 20 MG PO TABS
20.0000 mg | ORAL_TABLET | Freq: Every day | ORAL | Status: DC
  Filled 2014-05-18 (×4): qty 1

## 2014-05-18 MED ORDER — TRAZODONE HCL 50 MG PO TABS
50.0000 mg | ORAL_TABLET | Freq: Every day | ORAL | Status: DC
  Administered 2014-05-19 – 2014-05-21 (×3): 50 mg via ORAL
  Filled 2014-05-18 (×3): qty 1

## 2014-05-18 MED ORDER — SERTRALINE HCL 50 MG PO TABS
50.0000 mg | ORAL_TABLET | Freq: Every day | ORAL | Status: DC
  Administered 2014-05-18 – 2014-05-19 (×2): 50 mg via ORAL
  Filled 2014-05-18 (×2): qty 1

## 2014-05-18 MED ORDER — HYDROCHLOROTHIAZIDE 25 MG PO TABS
25.0000 mg | ORAL_TABLET | Freq: Every day | ORAL | Status: DC
  Administered 2014-05-18 – 2014-05-22 (×5): 25 mg via ORAL
  Filled 2014-05-18 (×6): qty 1

## 2014-05-18 NOTE — ED Notes (Signed)
Patient resting comfortably at present. Respirations even and unlabored.

## 2014-05-18 NOTE — ED Notes (Addendum)
Patient reports SI and AH.  Denies HI, VH. Rates anxiety 10/10 and feelings of depression 25/10. States that he is homeless and gets poor sleep. States that he does not want to be in the hospital at present, but is glad that someone cared enough to make a call. Reports that his family lost their home after police found drugs and guns. States wife and two youngest children are with friends. States he lost his job shortly after this and was no longer able to pay his attorney. Patient explained that he had a court date yesterday with a one month continuance. States he was unable to participate in follow up program after leaving [facility] because he did not have an attorney for the facility to contact a few weeks ago.  Encouragement offered. Meal provided. Given Ativan.  Q 15 safety checks in place.

## 2014-05-18 NOTE — Progress Notes (Signed)
[facility] [place]  [place] with patient about [employer]. Patient has card with [facility]. Provided pt with there contact information and information on [employer] of the [place]. CL will refer patient to [facility] care management for further f/u.

## 2014-05-18 NOTE — ED Notes (Signed)
Reminded patient of need for urine samplee.  Fluids encouraged.

## 2014-05-18 NOTE — ED Notes (Signed)
Report to oncoming RN medications due and coming from pharmacy

## 2014-05-18 NOTE — BH Assessment (Signed)
Tele Assessment Note   [name] Donovan is a 43 y.o. male who presents via IVC petition, initiated by [employer] physician, Dr. Romeo Apple.  Upon arrival to the ER, pt told medical staff that he was SI w/ a plan to go into the woods and hang himself from a tree, he says he asked someone for rope today and they called the police.  When this Clinical research associate interviewed pt, he adamantly denied SI/HI/AVH, stating that he never expressed SI to staff and that he came to the hospital for help with his addiction.  Pt was IVC'd because he wanted to leave the hospital.  Pt has tourettes syndrome.  Pt reports: he consumes 1 case of beer, daily. His last intake was 05/17/14 and he drank 2-40's.  Pt uses cocaine and says "I do a lot" and could not elaborate any further on his usage.  Pt has a court date 05/19/14 for trespassing.    Axis I: Major Depression, Recurrent severe and Alcohol Use D/O; Cocaine Use D/O Axis II: Deferred Axis III:  Past Medical History  Diagnosis Date  . Hypertension   . Depression    Axis IV: other psychosocial or environmental problems, problems related to legal system/crime, problems related to social environment and problems with primary support group Axis V: 31-40 impairment in reality testing  Past Medical History:  Past Medical History  Diagnosis Date  . Hypertension   . Depression     History reviewed. No pertinent past surgical history.  Family History:  Family History  Problem Relation Age of Onset  . Hypertension Mother   . Heart disease Father   . Heart disease Brother     Social History:  reports that he has been smoking Cigarettes.  He has been smoking about 0.00 packs per day. He does not have any smokeless tobacco history on file. He reports that he drinks alcohol. He reports that he uses illicit drugs (Marijuana and Cocaine) about once per week.  Additional Social History:  Alcohol / Drug Use Pain Medications: See MAR  Prescriptions: See MAR  Over the Counter:  See MAR  History of alcohol / drug use?: Yes Longest period of sobriety (when/how long): Only when in detox  Negative Consequences of Use: Work / School;Personal relationships;Financial;Legal Withdrawal Symptoms: Other (Comment) (No w/d sxs ) Substance #1 Name of Substance 1: Alcohol  1 - Age of First Use: Teens  1 - Amount (size/oz): 1 Case  1 - Frequency: Daily  1 - Duration: On-going  1 - Last Use / Amount: 05/17/14 Substance #2 Name of Substance 2: Cocaine  2 - Age of First Use: Teens  2 - Amount (size/oz): "I do alot"  2 - Frequency: Daily  2 - Duration: On-going  2 - Last Use / Amount: 05/17/14  CIWA: CIWA-Ar BP: 129/75 mmHg Pulse Rate: 89 Nausea and Vomiting: no nausea and no vomiting Tactile Disturbances: none Tremor: no tremor Auditory Disturbances: not present Paroxysmal Sweats: barely perceptible sweating, palms moist Visual Disturbances: not present Anxiety: five Headache, Fullness in Head: none present Agitation: two Orientation and Clouding of Sensorium: oriented and can do serial additions CIWA-Ar Total: 8 COWS:    PATIENT STRENGTHS: (choose at least two) General fund of knowledge  Allergies:  Allergies  Allergen Reactions  . Penicillins Hives, Shortness Of Breath and Rash    dizziness    Home Medications:  (Not in a hospital admission)  OB/GYN Status:  No LMP for male patient.  General Assessment Data Location of Assessment:  [facility] ED Is this a Tele or Face-to-Face Assessment?: Face-to-Face Is this an Initial Assessment or a Re-assessment for this encounter?: Initial Assessment Living Arrangements: Other (Comment) (Homeless ) Can pt return to current living arrangement?: Yes Admission Status: Involuntary Is patient capable of signing voluntary admission?: No Transfer from: Acute Hospital Referral Source: MD  Medical Screening Exam [facility] Walk-in ONLY) Medical Exam completed: No Reason for MSE not completed: Other: (None )  [facility]  Plan Living Arrangements: Other (Comment) (Homeless ) Name of Psychiatrist: None  Name of Therapist: None   Education Status Is patient currently in school?: No Current Grade: None  Highest grade of school patient has completed: None  Name of school: None  Contact person: None   Risk to self with the past 6 months Suicidal Ideation: No Suicidal Intent: No Is patient at risk for suicide?: No Suicidal Plan?: No Specify Current Suicidal Plan: None  Access to Means: No Specify Access to Suicidal Means: None  What has been your use of drugs/alcohol within the last 12 months?: Absuing: alcohol, cocaine  Previous Attempts/Gestures: Yes How many times?: 2 Other Self Harm Risks: None  Triggers for Past Attempts: Unknown;Unpredictable Intentional Self Injurious Behavior: None Family Suicide History: No Recent stressful life event(s): Financial Problems;Other (Comment) Persecutory voices/beliefs?: No Depression: Yes Depression Symptoms: Loss of interest in usual pleasures;Feeling angry/irritable Substance abuse history and/or treatment for substance abuse?: Yes Suicide prevention information given to non-admitted patients: Not applicable  Risk to Others within the past 6 months Homicidal Ideation: No Thoughts of Harm to Others: No Current Homicidal Intent: No Current Homicidal Plan: No Access to Homicidal Means: No Identified Victim: None  History of harm to others?: No Assessment of Violence: None Noted Violent Behavior Description: None  Does patient have access to weapons?: No Criminal Charges Pending?: Yes Describe Pending Criminal Charges: Trespassing  Does patient have a court date: Yes Court Date: 05/19/14  Psychosis Hallucinations: None noted Delusions: None noted  Mental Status Report Appear/Hygiene: In scrubs;Disheveled Eye Contact: Good Motor Activity: Unremarkable Speech: Logical/coherent;Loud;Pressured Level of Consciousness: Alert;Irritable Mood:  Irritable Affect: Irritable Anxiety Level: None Thought Processes: Coherent;Relevant Judgement: Unimpaired Orientation: Person;Place;Time;Situation Obsessive Compulsive Thoughts/Behaviors: None  Cognitive Functioning Concentration: Normal Memory: Recent Intact;Remote Intact IQ: Average Insight: Poor Impulse Control: Poor Appetite: Good Weight Loss: 0 Weight Gain: 0 Sleep: Decreased Total Hours of Sleep: 5 Vegetative Symptoms: None  ADLScreening [facility] Assessment Services) Patient's cognitive ability adequate to safely complete daily activities?: Yes Patient able to express need for assistance with ADLs?: Yes Independently performs ADLs?: Yes (appropriate for developmental age)  Prior Inpatient Therapy Prior Inpatient Therapy: Yes Prior Therapy Dates: 2015 Prior Therapy Facilty/Provider(s): [facility]  Reason for Treatment: SA/Depression   Prior Outpatient Therapy Prior Outpatient Therapy: No Prior Therapy Dates: None  Prior Therapy Facilty/Provider(s): None  Reason for Treatment: None   ADL Screening (condition at time of admission) Patient's cognitive ability adequate to safely complete daily activities?: Yes Is the patient deaf or have difficulty hearing?: No Does the patient have difficulty seeing, even when wearing glasses/contacts?: No Does the patient have difficulty concentrating, remembering, or making decisions?: Yes Patient able to express need for assistance with ADLs?: Yes Does the patient have difficulty dressing or bathing?: No Independently performs ADLs?: Yes (appropriate for developmental age) Does the patient have difficulty walking or climbing stairs?: No Weakness of Legs: None Weakness of Arms/Hands: None  Home Assistive Devices/Equipment Home Assistive Devices/Equipment: None  Therapy Consults (therapy consults require a physician order) PT Evaluation  Needed: No OT Evalulation Needed: No SLP Evaluation Needed: No Abuse/Neglect Assessment  (Assessment to be complete while patient is alone) Physical Abuse: Denies Verbal Abuse: Denies Sexual Abuse: Denies Exploitation of patient/patient's resources: Denies Self-Neglect: Denies Values / Beliefs Cultural Requests During Hospitalization: None Spiritual Requests During Hospitalization: None Consults Spiritual Care Consult Needed: No Social Work Consult Needed: No Merchant navy officer (For Healthcare) Does patient have an advance directive?: No Would patient like information on creating an advanced directive?: No - patient declined information Nutrition Screen- [facility] Adult/[facility] Patient's home diet: Regular  Additional Information 1:1 In Past 12 Months?: No CIRT Risk: No Elopement Risk: No Does patient have medical clearance?: Yes     Disposition:  Disposition Initial Assessment Completed for this Encounter: Yes Disposition of Patient: Referred to (AM psych eval for final disposition ) Type of inpatient treatment program: Adult Patient referred to: Other (Comment) (AM psych eval for final disposition )  Murrell Redden 05/18/2014 6:31 AM

## 2014-05-18 NOTE — Consult Note (Signed)
[facility]   Reason for Consult:  Suicidal thoughts with a plan to hang himself Referring Physician:  ER MD  [name] Donovan is an 43 y.o. male. Total Time spent with patient: 45 minutes  Assessment: AXIS I:  Major Depression, Recurrent severe and alcohol dependence AXIS II:  Deferred AXIS III:   Past Medical History  Diagnosis Date  . Hypertension   . Depression    AXIS IV:  economic problems, housing problems, occupational problems, problems related to legal system/crime and chronic addiction AXIS V:  41-50 serious symptoms  Plan:  Recommend psychiatric Inpatient admission when medically cleared.  Subjective:   [name] Donovan is a 43 y.o. male patient admitted with suicidal threats to hang himself.  HPI:  [name] Donovan says he has alcohol addiction and depression.  He was inpatient at [facility] 2 weeks ago and discharged to follow up at [facility] for alcohol rehab but they could not take him because he has court date pending.  He went to court but the date was rescheduled.  So he still cannot go to rehab.  Consequently he got more depressed again and is now suicidal with a plan to hang himself.  He cannot contract for safety.  He also has restarted drinking daily. HPI Elements:   Location:  depression. Quality:  suicidal thoughts. Severity:  plan to hang himself and trying to find a rope to do so. Timing:  could not get into rehab. Duration:  several years. Context:  as above.  Past Psychiatric History: Past Medical History  Diagnosis Date  . Hypertension   . Depression     reports that he has been smoking Cigarettes.  He has been smoking about 0.00 packs per day. He does not have any smokeless tobacco history on file. He reports that he drinks alcohol. He reports that he uses illicit drugs (Marijuana and Cocaine) about once per week. Family History  Problem Relation Age of Onset  . Hypertension Mother   . Heart disease Father   . Heart disease Brother     Family History Substance Abuse: Yes, Describe: Armed forces operational officer ) Family Supports: No (None reported ) Living Arrangements: Other (Comment) (Homeless ) Can pt return to current living arrangement?: Yes Abuse/Neglect [facility]) Physical Abuse: Denies Verbal Abuse: Denies Sexual Abuse: Denies Allergies:   Allergies  Allergen Reactions  . Penicillins Hives, Shortness Of Breath and Rash    dizziness    ACT Assessment Complete:  Yes:    Educational Status    Risk to Self: Risk to self with the past 6 months Suicidal Ideation: No Suicidal Intent: No Is patient at risk for suicide?: No Suicidal Plan?: No Specify Current Suicidal Plan: None  Access to Means: No Specify Access to Suicidal Means: None  What has been your use of drugs/alcohol within the last 12 months?: Absuing: alcohol, cocaine  Previous Attempts/Gestures: Yes How many times?: 2 Other Self Harm Risks: None  Triggers for Past Attempts: Unknown;Unpredictable Intentional Self Injurious Behavior: None Family Suicide History: No Recent stressful life event(s): Financial Problems;Other (Comment) Persecutory voices/beliefs?: No Depression: Yes Depression Symptoms: Loss of interest in usual pleasures;Feeling angry/irritable Substance abuse history and/or treatment for substance abuse?: Yes Suicide prevention information given to non-admitted patients: Not applicable  Risk to Others: Risk to Others within the past 6 months Homicidal Ideation: No Thoughts of Harm to Others: No Current Homicidal Intent: No Current Homicidal Plan: No Access to Homicidal Means: No Identified Victim: None  History of harm to others?: No Assessment  of Violence: None Noted Violent Behavior Description: None  Does patient have access to weapons?: No Criminal Charges Pending?: Yes Describe Pending Criminal Charges: Trespassing  Does patient have a court date: Yes Court Date: 05/19/14  Abuse: Abuse/Neglect Assessment (Assessment to be complete  while patient is alone) Physical Abuse: Denies Verbal Abuse: Denies Sexual Abuse: Denies Exploitation of patient/patient's resources: Denies Self-Neglect: Denies  Prior Inpatient Therapy: Prior Inpatient Therapy Prior Inpatient Therapy: Yes Prior Therapy Dates: 2015 Prior Therapy Facilty/Provider(s): [facility]  Reason for Treatment: SA/Depression   Prior Outpatient Therapy: Prior Outpatient Therapy Prior Outpatient Therapy: No Prior Therapy Dates: None  Prior Therapy Facilty/Provider(s): None  Reason for Treatment: None   Additional Information: Additional Information 1:1 In Past 12 Months?: No CIRT Risk: No Elopement Risk: No Does patient have medical clearance?: Yes                  Objective: Blood pressure 129/75, pulse 89, temperature 98.6 F (37 C), temperature source Oral, resp. rate 18, SpO2 100.00%.There is no weight on file to calculate BMI. Results for orders placed during the hospital encounter of 05/17/14 (from the past 72 hour(s))  ACETAMINOPHEN LEVEL     Status: None   Collection Time    05/17/14 10:47 PM      Result Value Ref Range   Acetaminophen (Tylenol), Serum <15.0  10 - 30 ug/mL   Comment:            THERAPEUTIC CONCENTRATIONS VARY     SIGNIFICANTLY. A RANGE OF 10-30     ug/mL MAY BE AN EFFECTIVE     CONCENTRATION FOR MANY PATIENTS.     HOWEVER, SOME ARE BEST TREATED     AT CONCENTRATIONS OUTSIDE THIS     RANGE.     ACETAMINOPHEN CONCENTRATIONS     >150 ug/mL AT 4 HOURS AFTER     INGESTION AND >50 ug/mL AT 12     HOURS AFTER INGESTION ARE     OFTEN ASSOCIATED WITH TOXIC     REACTIONS.  CBC     Status: Abnormal   Collection Time    05/17/14 10:47 PM      Result Value Ref Range   WBC 7.6  4.0 - 10.5 K/uL   RBC 6.01 (*) 4.22 - 5.81 MIL/uL   Hemoglobin 14.6  13.0 - 17.0 g/dL   HCT 42.6  39.0 - 52.0 %   MCV 70.9 (*) 78.0 - 100.0 fL   MCH 24.3 (*) 26.0 - 34.0 pg   MCHC 34.3  30.0 - 36.0 g/dL   RDW 14.7  11.5 - 15.5 %   Platelets 294   150 - 400 K/uL  COMPREHENSIVE METABOLIC PANEL     Status: Abnormal   Collection Time    05/17/14 10:47 PM      Result Value Ref Range   Sodium 136 (*) 137 - 147 mEq/L   Potassium 4.4  3.7 - 5.3 mEq/L   Chloride 97  96 - 112 mEq/L   CO2 22  19 - 32 mEq/L   Glucose, Bld 98  70 - 99 mg/dL   BUN 12  6 - 23 mg/dL   Creatinine, Ser 1.38 (*) 0.50 - 1.35 mg/dL   Calcium 8.3 (*) 8.4 - 10.5 mg/dL   Total Protein 7.7  6.0 - 8.3 g/dL   Albumin 3.7  3.5 - 5.2 g/dL   AST 33  0 - 37 U/L   Comment: SLIGHT HEMOLYSIS     HEMOLYSIS AT  THIS LEVEL MAY AFFECT RESULT   ALT 25  0 - 53 U/L   Alkaline Phosphatase 99  39 - 117 U/L   Total Bilirubin 0.2 (*) 0.3 - 1.2 mg/dL   GFR calc non Af Amer 62 (*) >90 mL/min   GFR calc Af Amer 72 (*) >90 mL/min   Comment: (NOTE)     The eGFR has been calculated using the CKD EPI equation.     This calculation has not been validated in all clinical situations.     eGFR's persistently <90 mL/min signify possible Chronic Kidney     Disease.   Anion gap 17 (*) 5 - 15  ETHANOL     Status: Abnormal   Collection Time    05/17/14 10:47 PM      Result Value Ref Range   Alcohol, Ethyl (B) 296 (*) 0 - 11 mg/dL   Comment:            LOWEST DETECTABLE LIMIT FOR     SERUM ALCOHOL IS 11 mg/dL     FOR MEDICAL PURPOSES ONLY  SALICYLATE LEVEL     Status: Abnormal   Collection Time    05/17/14 10:47 PM      Result Value Ref Range   Salicylate Lvl <4.0 (*) 2.8 - 20.0 mg/dL   Labs are reviewed and are pertinent for alcohol of 296.  Current Facility-Administered Medications  Medication Dose Route Frequency Provider Last Rate Last Dose  . LORazepam (ATIVAN) tablet 0-4 mg  0-4 mg Oral 4 times per day Pamella Pert, MD   2 mg at 05/18/14 0003   Followed by  . [START ON 05/20/2014] LORazepam (ATIVAN) tablet 0-4 mg  0-4 mg Oral Q12H Pamella Pert, MD      . thiamine (VITAMIN B-1) tablet 100 mg  100 mg Oral Daily Pamella Pert, MD       Or  . thiamine (B-1) injection 100  mg  100 mg Intravenous Daily Pamella Pert, MD       Current Outpatient Prescriptions  Medication Sig Dispense Refill  . amLODipine (NORVASC) 10 MG tablet Take 10 mg by mouth daily.      . hydrochlorothiazide (HYDRODIURIL) 25 MG tablet Take 25 mg by mouth daily.      . sertraline (ZOLOFT) 50 MG tablet Take 1 tablet (50 mg total) by mouth daily. For depression  30 tablet  0  . traZODone (DESYREL) 50 MG tablet Take 1 tablet (50 mg total) by mouth at bedtime. Sleep  30 tablet  0    Psychiatric Specialty Exam:     Blood pressure 129/75, pulse 89, temperature 98.6 F (37 C), temperature source Oral, resp. rate 18, SpO2 100.00%.There is no weight on file to calculate BMI.  General Appearance: Casual  Eye Contact::  Good  Speech:  Clear and Coherent  Volume:  Normal  Mood:  Depressed  Affect:  Congruent  Thought Process:  Coherent  Orientation:  Full (Time, Place, and Person)  Thought Content:  Negative  Suicidal Thoughts:  Yes.  with intent/plan  Homicidal Thoughts:  No  Memory:  Immediate;   Good Recent;   Good Remote;   Good  Judgement:  Intact  Insight:  Fair  Psychomotor Activity:  Normal  Concentration:  Good  Recall:  Good  Fund of Knowledge:Good  Language: Good  Akathisia:  Negative  Handed:  Right  AIMS (if indicated):     Assets:  Communication Skills Desire for Improvement  Sleep:  Musculoskeletal: Strength & Muscle Tone: within normal limits Gait & Station: normal Patient leans: N/A  Treatment Plan Summary: Daily contact with patient to assess and evaluate symptoms and progress in treatment Medication management will seek inpatient bed for treatment of depression and alcohol detox.  Clarene Reamer 05/18/2014 8:53 AM

## 2014-05-18 NOTE — ED Notes (Signed)
Patient states that he is feeling "hopeless". Reports feelings of SI. States his withdraw symptoms are improving since taking medication.   Encouragement offered.   Q 15 safety checks in place.

## 2014-05-19 DIAGNOSIS — R45851 Suicidal ideations: Secondary | ICD-10-CM

## 2014-05-19 DIAGNOSIS — F332 Major depressive disorder, recurrent severe without psychotic features: Secondary | ICD-10-CM

## 2014-05-19 DIAGNOSIS — F10230 Alcohol dependence with withdrawal, uncomplicated: Secondary | ICD-10-CM | POA: Diagnosis present

## 2014-05-19 DIAGNOSIS — F1010 Alcohol abuse, uncomplicated: Secondary | ICD-10-CM

## 2014-05-19 MED ORDER — SERTRALINE HCL 50 MG PO TABS
100.0000 mg | ORAL_TABLET | Freq: Every day | ORAL | Status: DC
  Administered 2014-05-20 – 2014-05-22 (×3): 100 mg via ORAL
  Filled 2014-05-19 (×3): qty 2

## 2014-05-19 MED ORDER — CLONIDINE HCL 0.1 MG PO TABS
0.1000 mg | ORAL_TABLET | Freq: Two times a day (BID) | ORAL | Status: AC
  Administered 2014-05-19 – 2014-05-20 (×4): 0.1 mg via ORAL
  Filled 2014-05-19 (×4): qty 1

## 2014-05-19 NOTE — Consult Note (Signed)
[facility] Consult   Reason for Consult:  Suicidal ideations with a plan Referring Physician:  EDP  [name] Donovan is an 43 y.o. male. Total Time spent with patient: 20 minutes  Assessment: AXIS I:  Alcohol Abuse and Major Depression, Recurrent severe AXIS II:  Deferred AXIS III:   Past Medical History  Diagnosis Date  . Hypertension   . Depression    AXIS IV:  other psychosocial or environmental problems, problems related to legal system/crime, problems related to social environment and problems with primary support group AXIS V:  21-30 behavior considerably influenced by delusions or hallucinations OR serious impairment in judgment, communication OR inability to function in almost all areas  Plan:  Recommend psychiatric Inpatient admission when medically cleared.Dr. Darleene Cleaver assessed and concurs with the plan.  Subjective:   [name] Donovan is a 43 y.o. male patient admitted with depression and plan, alcohol detox/dependency.  HPI:  On admission:  43 y.o. male who presents via IVC petition, initiated by [employer] physician, Dr. Aline Brochure. Upon arrival to the ER, pt told medical staff that he was SI w/ a plan to go into the woods and hang himself from a tree, he says he asked someone for rope today and they called the police. When this Probation officer interviewed pt, he adamantly denied SI/HI/AVH, stating that he never expressed SI to staff and that he came to the hospital for help with his addiction. Pt was IVC'd because he wanted to leave the hospital. Pt has tourettes syndrome. Pt reports: he consumes 1 case of beer, daily. His last intake was 05/17/14 and he drank 2-40's. Pt uses cocaine and says "I do a lot" and could not elaborate any further on his usage. Pt has a court date 05/19/14 for trespassing. Today:   HPI Elements:   Location:  generalized. Quality:  acute. Severity:  severe. Timing:  constant. Duration:  3 days. Context:  legal issues.  Past Psychiatric  History: Past Medical History  Diagnosis Date  . Hypertension   . Depression     reports that he has been smoking Cigarettes.  He has been smoking about 0.00 packs per day. He does not have any smokeless tobacco history on file. He reports that he drinks alcohol. He reports that he uses illicit drugs (Marijuana and Cocaine) about once per week. Family History  Problem Relation Age of Onset  . Hypertension Mother   . Heart disease Father   . Heart disease Brother    Family History Substance Abuse: Yes, Describe: Armed forces operational officer ) Family Supports: No (None reported ) Living Arrangements: Other (Comment) (Homeless ) Can pt return to current living arrangement?: Yes Abuse/Neglect [facility]) Physical Abuse: Denies Verbal Abuse: Denies Sexual Abuse: Denies Allergies:   Allergies  Allergen Reactions  . Penicillins Hives, Shortness Of Breath and Rash    dizziness    ACT Assessment Complete:  Yes:    Educational Status    Risk to Self: Risk to self with the past 6 months Suicidal Ideation: No Suicidal Intent: No Is patient at risk for suicide?: No Suicidal Plan?: No Specify Current Suicidal Plan: None  Access to Means: No Specify Access to Suicidal Means: None  What has been your use of drugs/alcohol within the last 12 months?: Absuing: alcohol, cocaine  Previous Attempts/Gestures: Yes How many times?: 2 Other Self Harm Risks: None  Triggers for Past Attempts: Unknown;Unpredictable Intentional Self Injurious Behavior: None Family Suicide History: No Recent stressful life event(s): Financial Problems;Other (Comment) Persecutory voices/beliefs?: No Depression: Yes  Depression Symptoms: Loss of interest in usual pleasures;Feeling angry/irritable Substance abuse history and/or treatment for substance abuse?: Yes Suicide prevention information given to non-admitted patients: Not applicable  Risk to Others: Risk to Others within the past 6 months Homicidal Ideation: No Thoughts of  Harm to Others: No Current Homicidal Intent: No Current Homicidal Plan: No Access to Homicidal Means: No Identified Victim: None  History of harm to others?: No Assessment of Violence: None Noted Violent Behavior Description: None  Does patient have access to weapons?: No Criminal Charges Pending?: Yes Describe Pending Criminal Charges: Trespassing  Does patient have a court date: Yes Court Date: 05/19/14  Abuse: Abuse/Neglect Assessment (Assessment to be complete while patient is alone) Physical Abuse: Denies Verbal Abuse: Denies Sexual Abuse: Denies Exploitation of patient/patient's resources: Denies Self-Neglect: Denies  Prior Inpatient Therapy: Prior Inpatient Therapy Prior Inpatient Therapy: Yes Prior Therapy Dates: 2015 Prior Therapy Facilty/Provider(s): [facility]  Reason for Treatment: SA/Depression   Prior Outpatient Therapy: Prior Outpatient Therapy Prior Outpatient Therapy: No Prior Therapy Dates: None  Prior Therapy Facilty/Provider(s): None  Reason for Treatment: None   Additional Information: Additional Information 1:1 In Past 12 Months?: No CIRT Risk: No Elopement Risk: No Does patient have medical clearance?: Yes                  Objective: Blood pressure 172/116, pulse 71, temperature 98.8 F (37.1 C), temperature source Oral, resp. rate 19, SpO2 99.00%.There is no weight on file to calculate BMI. Results for orders placed during the hospital encounter of 05/17/14 (from the past 72 hour(s))  ACETAMINOPHEN LEVEL     Status: None   Collection Time    05/17/14 10:47 PM      Result Value Ref Range   Acetaminophen (Tylenol), Serum <15.0  10 - 30 ug/mL   Comment:            THERAPEUTIC CONCENTRATIONS VARY     SIGNIFICANTLY. A RANGE OF 10-30     ug/mL MAY BE AN EFFECTIVE     CONCENTRATION FOR MANY PATIENTS.     HOWEVER, SOME ARE BEST TREATED     AT CONCENTRATIONS OUTSIDE THIS     RANGE.     ACETAMINOPHEN CONCENTRATIONS     >150 ug/mL AT 4 HOURS  AFTER     INGESTION AND >50 ug/mL AT 12     HOURS AFTER INGESTION ARE     OFTEN ASSOCIATED WITH TOXIC     REACTIONS.  CBC     Status: Abnormal   Collection Time    05/17/14 10:47 PM      Result Value Ref Range   WBC 7.6  4.0 - 10.5 K/uL   RBC 6.01 (*) 4.22 - 5.81 MIL/uL   Hemoglobin 14.6  13.0 - 17.0 g/dL   HCT 42.6  39.0 - 52.0 %   MCV 70.9 (*) 78.0 - 100.0 fL   MCH 24.3 (*) 26.0 - 34.0 pg   MCHC 34.3  30.0 - 36.0 g/dL   RDW 14.7  11.5 - 15.5 %   Platelets 294  150 - 400 K/uL  COMPREHENSIVE METABOLIC PANEL     Status: Abnormal   Collection Time    05/17/14 10:47 PM      Result Value Ref Range   Sodium 136 (*) 137 - 147 mEq/L   Potassium 4.4  3.7 - 5.3 mEq/L   Chloride 97  96 - 112 mEq/L   CO2 22  19 - 32 mEq/L   Glucose, Bld 98  70 - 99 mg/dL   BUN 12  6 - 23 mg/dL   Creatinine, Ser 1.38 (*) 0.50 - 1.35 mg/dL   Calcium 8.3 (*) 8.4 - 10.5 mg/dL   Total Protein 7.7  6.0 - 8.3 g/dL   Albumin 3.7  3.5 - 5.2 g/dL   AST 33  0 - 37 U/L   Comment: SLIGHT HEMOLYSIS     HEMOLYSIS AT THIS LEVEL MAY AFFECT RESULT   ALT 25  0 - 53 U/L   Alkaline Phosphatase 99  39 - 117 U/L   Total Bilirubin 0.2 (*) 0.3 - 1.2 mg/dL   GFR calc non Af Amer 62 (*) >90 mL/min   GFR calc Af Amer 72 (*) >90 mL/min   Comment: (NOTE)     The eGFR has been calculated using the CKD EPI equation.     This calculation has not been validated in all clinical situations.     eGFR's persistently <90 mL/min signify possible Chronic Kidney     Disease.   Anion gap 17 (*) 5 - 15  ETHANOL     Status: Abnormal   Collection Time    05/17/14 10:47 PM      Result Value Ref Range   Alcohol, Ethyl (B) 296 (*) 0 - 11 mg/dL   Comment:            LOWEST DETECTABLE LIMIT FOR     SERUM ALCOHOL IS 11 mg/dL     FOR MEDICAL PURPOSES ONLY  SALICYLATE LEVEL     Status: Abnormal   Collection Time    05/17/14 10:47 PM      Result Value Ref Range   Salicylate Lvl <2.2 (*) 2.8 - 20.0 mg/dL  URINE RAPID DRUG SCREEN (HOSP  PERFORMED)     Status: None   Collection Time    05/18/14  6:53 PM      Result Value Ref Range   Opiates NONE DETECTED  NONE DETECTED   Cocaine NONE DETECTED  NONE DETECTED   Benzodiazepines NONE DETECTED  NONE DETECTED   Amphetamines NONE DETECTED  NONE DETECTED   Tetrahydrocannabinol NONE DETECTED  NONE DETECTED   Barbiturates NONE DETECTED  NONE DETECTED   Comment:            DRUG SCREEN FOR MEDICAL PURPOSES     ONLY.  IF CONFIRMATION IS NEEDED     FOR ANY PURPOSE, NOTIFY LAB     WITHIN 5 DAYS.                LOWEST DETECTABLE LIMITS     FOR URINE DRUG SCREEN     Drug Class       Cutoff (ng/mL)     Amphetamine      1000     Barbiturate      200     Benzodiazepine   297     Tricyclics       989     Opiates          300     Cocaine          300     THC              50   Labs are reviewed and are pertinent for no medical issues needing attention.  Current Facility-Administered Medications  Medication Dose Route Frequency Provider Last Rate Last Dose  . hydrochlorothiazide (HYDRODIURIL) tablet 25 mg  25 mg Oral Daily Ephraim Hamburger, MD   25  mg at 05/19/14 1027  . lisinopril (PRINIVIL,ZESTRIL) tablet 20 mg  20 mg Oral Daily Ephraim Hamburger, MD      . LORazepam (ATIVAN) tablet 0-4 mg  0-4 mg Oral 4 times per day Pamella Pert, MD   1 mg at 05/19/14 1215   Followed by  . [START ON 05/20/2014] LORazepam (ATIVAN) tablet 0-4 mg  0-4 mg Oral Q12H Pamella Pert, MD      . sertraline (ZOLOFT) tablet 50 mg  50 mg Oral Daily Ephraim Hamburger, MD   50 mg at 05/19/14 3729  . thiamine (VITAMIN B-1) tablet 100 mg  100 mg Oral Daily Pamella Pert, MD   100 mg at 05/19/14 0211   Or  . thiamine (B-1) injection 100 mg  100 mg Intravenous Daily Pamella Pert, MD      . traZODone (DESYREL) tablet 50 mg  50 mg Oral QHS Ephraim Hamburger, MD       Current Outpatient Prescriptions  Medication Sig Dispense Refill  . hydrochlorothiazide (HYDRODIURIL) 25 MG tablet Take 25 mg by mouth  daily.      Marland Kitchen lisinopril (PRINIVIL,ZESTRIL) 20 MG tablet Take 20 mg by mouth daily.      . sertraline (ZOLOFT) 50 MG tablet Take 1 tablet (50 mg total) by mouth daily. For depression  30 tablet  0  . traZODone (DESYREL) 50 MG tablet Take 1 tablet (50 mg total) by mouth at bedtime. Sleep  30 tablet  0    Psychiatric Specialty Exam:     Blood pressure 172/116, pulse 71, temperature 98.8 F (37.1 C), temperature source Oral, resp. rate 19, SpO2 99.00%.There is no weight on file to calculate BMI.  General Appearance: Casual  Eye Contact::  Fair  Speech:  Normal Rate  Volume:  Decreased  Mood:  Depressed  Affect:  Congruent  Thought Process:  Coherent  Orientation:  Full (Time, Place, and Person)  Thought Content:  WDL  Suicidal Thoughts:  Yes.  with intent/plan  Homicidal Thoughts:  No  Memory:  Immediate;   Fair Recent;   Fair Remote;   Fair  Judgement:  Fair  Insight:  Fair  Psychomotor Activity:  Decreased  Concentration:  Fair  Recall:  Poor  Fund of Knowledge:Fair  Language: Fair  Akathisia:  No  Handed:  Right  AIMS (if indicated):     Assets:  Leisure Time Physical Health Resilience  Sleep:      Musculoskeletal: Strength & Muscle Tone: within normal limits Gait & Station: normal Patient leans: N/A  Treatment Plan Summary: Daily contact with patient to assess and evaluate symptoms and progress in treatment Medication management; admit to inpatient psychiatric unit for stabilization  Waylan Boga, PMH-NP 05/19/2014 1:46 PM  Patient seen, evaluated and I agree with notes by Nurse Practitioner. Corena Pilgrim, MD

## 2014-05-19 NOTE — ED Notes (Addendum)
Provider notified. Pt reports not taken his lisinopril  for the past 1 or 2 weeks due to side effects.  Pt denies H/A, blurred vision. Will continue to monitor for safety.

## 2014-05-19 NOTE — ED Notes (Signed)
Up to restroom. Denies complaints.

## 2014-05-19 NOTE — ED Notes (Signed)
Patient resting quietly. Respirations even and unlabored. Ativan held at present.

## 2014-05-19 NOTE — ED Notes (Signed)
Report received from Tanika RN. Pt. Sleeping. Respirations even and unlabored.  Will continue to monitor for safety. Q 15 minute checks continue. 

## 2014-05-19 NOTE — BH Assessment (Addendum)
Inpt tx SA/MH recommended, no [facility] beds available. Faxed referral to the following for potential placement: [facility] [place] [phone] Call from [place] at [facility] pt is out of contract area and would have to pay out of pocket.   Clista Bernhardt, [facility] Triage Specialist 05/19/2014 5:38 AM

## 2014-05-20 MED ORDER — ACETAMINOPHEN 325 MG PO TABS: 650.0000 mg | ORAL_TABLET | Freq: Once | ORAL | Status: DC

## 2014-05-20 MED ORDER — ACETAMINOPHEN 325 MG PO TABS: 650.0000 mg | ORAL_TABLET | ORAL | Status: DC | PRN

## 2014-05-20 MED ORDER — IBUPROFEN 200 MG PO TABS
600.0000 mg | ORAL_TABLET | Freq: Four times a day (QID) | ORAL | Status: DC | PRN
  Administered 2014-05-20 – 2014-05-21 (×2): 600 mg via ORAL
  Filled 2014-05-20 (×2): qty 3

## 2014-05-20 NOTE — ED Notes (Signed)
Dr Lolly Mustache and josephine NP into see

## 2014-05-20 NOTE — ED Notes (Signed)
Up to the bathroom 

## 2014-05-20 NOTE — Consult Note (Signed)
Psychiatry Follow Up cConsult   Total Time spent with patient: 15 minutes  Assessment: AXIS I:  Alcohol Abuse and Major Depression, Recurrent severe AXIS II:  Deferred AXIS III:   Past Medical History  Diagnosis Date  . Hypertension   . Depression    AXIS IV:  other psychosocial or environmental problems, problems related to legal system/crime, problems related to social environment and problems with primary support group AXIS V:  21-30 behavior considerably influenced by delusions or hallucinations OR serious impairment in judgment, communication OR inability to function in almost all areas  Plan:  Recommend psychiatric Inpatient admission when medically cleared.  Subjective:   [name] Donovan is a 43 y.o. male patient admitted with depression and plan, alcohol detox/dependency.  Patient's seen chart reviewed.  He remains very anxious and he continues to have suicidal thoughts and plan to hang himself.  He is very concerned about upcoming court date.  Patient denies any homicidal thoughts however he feels that he depressed hopeless and tearful.  He has tremors and shakes. Pt has a court date 05/19/14 for trespassing.  Past Psychiatric History: Past Medical History  Diagnosis Date  . Hypertension   . Depression     reports that he has been smoking Cigarettes.  He has been smoking about 0.00 packs per day. He does not have any smokeless tobacco history on file. He reports that he drinks alcohol. He reports that he uses illicit drugs (Marijuana and Cocaine) about once per week. Family History  Problem Relation Age of Onset  . Hypertension Mother   . Heart disease Father   . Heart disease Brother    Family History Substance Abuse: Yes, Describe: Armed forces operational officer ) Family Supports: No (None reported ) Living Arrangements: Other (Comment) (Homeless ) Can pt return to current living arrangement?: Yes Abuse/Neglect [facility]) Physical Abuse: Denies Verbal Abuse: Denies Sexual Abuse:  Denies Allergies:   Allergies  Allergen Reactions  . Penicillins Hives, Shortness Of Breath and Rash    dizziness    ACT Assessment Complete:  Yes:    Educational Status    Risk to Self: Risk to self with the past 6 months Suicidal Ideation: No Suicidal Intent: No Is patient at risk for suicide?: No Suicidal Plan?: No Specify Current Suicidal Plan: None  Access to Means: No Specify Access to Suicidal Means: None  What has been your use of drugs/alcohol within the last 12 months?: Absuing: alcohol, cocaine  Previous Attempts/Gestures: Yes How many times?: 2 Other Self Harm Risks: None  Triggers for Past Attempts: Unknown;Unpredictable Intentional Self Injurious Behavior: None Family Suicide History: No Recent stressful life event(s): Financial Problems;Other (Comment) Persecutory voices/beliefs?: No Depression: Yes Depression Symptoms: Loss of interest in usual pleasures;Feeling angry/irritable Substance abuse history and/or treatment for substance abuse?: Yes Suicide prevention information given to non-admitted patients: Not applicable  Risk to Others: Risk to Others within the past 6 months Homicidal Ideation: No Thoughts of Harm to Others: No Current Homicidal Intent: No Current Homicidal Plan: No Access to Homicidal Means: No Identified Victim: None  History of harm to others?: No Assessment of Violence: None Noted Violent Behavior Description: None  Does patient have access to weapons?: No Criminal Charges Pending?: Yes Describe Pending Criminal Charges: Trespassing  Does patient have a court date: Yes Court Date: 05/19/14  Abuse: Abuse/Neglect Assessment (Assessment to be complete while patient is alone) Physical Abuse: Denies Verbal Abuse: Denies Sexual Abuse: Denies Exploitation of patient/patient's resources: Denies Self-Neglect: Denies  Prior Inpatient Therapy: Prior Inpatient Therapy  Prior Inpatient Therapy: Yes Prior Therapy Dates: 2015 Prior Therapy  Facilty/Provider[facility]): [facility]  Reason for Treatment: SA/Depression   Prior Outpatient Therapy: Prior Outpatient Therapy Prior Outpatient Therapy: No Prior Therapy Dates: None  Prior Therapy Facilty/Provider(s): None  Reason for Treatment: None   Additional Information: Additional Information 1:1 In Past 12 Months?: No CIRT Risk: No Elopement Risk: No Does patient have medical clearance?: Yes                  Objective: Blood pressure 135/76, pulse 67, temperature 98.9 F (37.2 C), temperature source Oral, resp. rate 20, SpO2 99.00%.There is no weight on file to calculate BMI. Results for orders placed during the hospital encounter of 05/17/14 (from the past 72 hour(s))  ACETAMINOPHEN LEVEL     Status: None   Collection Time    05/17/14 10:47 PM      Result Value Ref Range   Acetaminophen (Tylenol), Serum <15.0  10 - 30 ug/mL   Comment:            THERAPEUTIC CONCENTRATIONS VARY     SIGNIFICANTLY. A RANGE OF 10-30     ug/mL MAY BE AN EFFECTIVE     CONCENTRATION FOR MANY PATIENTS.     HOWEVER, SOME ARE BEST TREATED     AT CONCENTRATIONS OUTSIDE THIS     RANGE.     ACETAMINOPHEN CONCENTRATIONS     >150 ug/mL AT 4 HOURS AFTER     INGESTION AND >50 ug/mL AT 12     HOURS AFTER INGESTION ARE     OFTEN ASSOCIATED WITH TOXIC     REACTIONS.  CBC     Status: Abnormal   Collection Time    05/17/14 10:47 PM      Result Value Ref Range   WBC 7.6  4.0 - 10.5 K/uL   RBC 6.01 (*) 4.22 - 5.81 MIL/uL   Hemoglobin 14.6  13.0 - 17.0 g/dL   HCT 42.6  39.0 - 52.0 %   MCV 70.9 (*) 78.0 - 100.0 fL   MCH 24.3 (*) 26.0 - 34.0 pg   MCHC 34.3  30.0 - 36.0 g/dL   RDW 14.7  11.5 - 15.5 %   Platelets 294  150 - 400 K/uL  COMPREHENSIVE METABOLIC PANEL     Status: Abnormal   Collection Time    05/17/14 10:47 PM      Result Value Ref Range   Sodium 136 (*) 137 - 147 mEq/L   Potassium 4.4  3.7 - 5.3 mEq/L   Chloride 97  96 - 112 mEq/L   CO2 22  19 - 32 mEq/L   Glucose, Bld 98  70 - 99  mg/dL   BUN 12  6 - 23 mg/dL   Creatinine, Ser 1.38 (*) 0.50 - 1.35 mg/dL   Calcium 8.3 (*) 8.4 - 10.5 mg/dL   Total Protein 7.7  6.0 - 8.3 g/dL   Albumin 3.7  3.5 - 5.2 g/dL   AST 33  0 - 37 U/L   Comment: SLIGHT HEMOLYSIS     HEMOLYSIS AT THIS LEVEL MAY AFFECT RESULT   ALT 25  0 - 53 U/L   Alkaline Phosphatase 99  39 - 117 U/L   Total Bilirubin 0.2 (*) 0.3 - 1.2 mg/dL   GFR calc non Af Amer 62 (*) >90 mL/min   GFR calc Af Amer 72 (*) >90 mL/min   Comment: (NOTE)     The eGFR has been calculated using the CKD  EPI equation.     This calculation has not been validated in all clinical situations.     eGFR's persistently <90 mL/min signify possible Chronic Kidney     Disease.   Anion gap 17 (*) 5 - 15  ETHANOL     Status: Abnormal   Collection Time    05/17/14 10:47 PM      Result Value Ref Range   Alcohol, Ethyl (B) 296 (*) 0 - 11 mg/dL   Comment:            LOWEST DETECTABLE LIMIT FOR     SERUM ALCOHOL IS 11 mg/dL     FOR MEDICAL PURPOSES ONLY  SALICYLATE LEVEL     Status: Abnormal   Collection Time    05/17/14 10:47 PM      Result Value Ref Range   Salicylate Lvl <6.8 (*) 2.8 - 20.0 mg/dL  URINE RAPID DRUG SCREEN (HOSP PERFORMED)     Status: None   Collection Time    05/18/14  6:53 PM      Result Value Ref Range   Opiates NONE DETECTED  NONE DETECTED   Cocaine NONE DETECTED  NONE DETECTED   Benzodiazepines NONE DETECTED  NONE DETECTED   Amphetamines NONE DETECTED  NONE DETECTED   Tetrahydrocannabinol NONE DETECTED  NONE DETECTED   Barbiturates NONE DETECTED  NONE DETECTED   Comment:            DRUG SCREEN FOR MEDICAL PURPOSES     ONLY.  IF CONFIRMATION IS NEEDED     FOR ANY PURPOSE, NOTIFY LAB     WITHIN 5 DAYS.                LOWEST DETECTABLE LIMITS     FOR URINE DRUG SCREEN     Drug Class       Cutoff (ng/mL)     Amphetamine      1000     Barbiturate      200     Benzodiazepine   088     Tricyclics       110     Opiates          300     Cocaine           300     THC              50   Labs are reviewed and are pertinent for no medical issues needing attention.  Current Facility-Administered Medications  Medication Dose Route Frequency Provider Last Rate Last Dose  . cloNIDine (CATAPRES) tablet 0.1 mg  0.1 mg Oral BID Waylan Boga, NP   0.1 mg at 05/20/14 1029  . hydrochlorothiazide (HYDRODIURIL) tablet 25 mg  25 mg Oral Daily Ephraim Hamburger, MD   25 mg at 05/20/14 1028  . lisinopril (PRINIVIL,ZESTRIL) tablet 20 mg  20 mg Oral Daily Ephraim Hamburger, MD      . LORazepam (ATIVAN) tablet 0-4 mg  0-4 mg Oral Q12H Pamella Pert, MD      . sertraline (ZOLOFT) tablet 100 mg  100 mg Oral Daily Waylan Boga, NP   100 mg at 05/20/14 1029  . thiamine (VITAMIN B-1) tablet 100 mg  100 mg Oral Daily Pamella Pert, MD   100 mg at 05/20/14 1028   Or  . thiamine (B-1) injection 100 mg  100 mg Intravenous Daily Pamella Pert, MD      . traZODone (DESYREL) tablet 50 mg  50 mg  Oral QHS Ephraim Hamburger, MD   50 mg at 05/19/14 2126   Current Outpatient Prescriptions  Medication Sig Dispense Refill  . hydrochlorothiazide (HYDRODIURIL) 25 MG tablet Take 25 mg by mouth daily.      Marland Kitchen lisinopril (PRINIVIL,ZESTRIL) 20 MG tablet Take 20 mg by mouth daily.      . sertraline (ZOLOFT) 50 MG tablet Take 1 tablet (50 mg total) by mouth daily. For depression  30 tablet  0  . traZODone (DESYREL) 50 MG tablet Take 1 tablet (50 mg total) by mouth at bedtime. Sleep  30 tablet  0    Psychiatric Specialty Exam:     Blood pressure 135/76, pulse 67, temperature 98.9 F (37.2 C), temperature source Oral, resp. rate 20, SpO2 99.00%.There is no weight on file to calculate BMI.  General Appearance: Casual  Eye Contact::  Fair  Speech:  Normal Rate  Volume:  Decreased  Mood:  Depressed  Affect:  Congruent  Thought Process:  Coherent  Orientation:  Full (Time, Place, and Person)  Thought Content:  WDL  Suicidal Thoughts:  Yes.  with intent/plan  Homicidal  Thoughts:  No  Memory:  Immediate;   Fair Recent;   Fair Remote;   Fair  Judgement:  Fair  Insight:  Fair  Psychomotor Activity:  Decreased  Concentration:  Fair  Recall:  Poor  Fund of Knowledge:Fair  Language: Fair  Akathisia:  No  Handed:  Right  AIMS (if indicated):     Assets:  Leisure Time Physical Health Resilience  Sleep:      Musculoskeletal: Strength & Muscle Tone: within normal limits Gait & Station: normal Patient leans: N/A  Treatment Plan Summary: Daily contact with patient to assess and evaluate symptoms and progress in treatment Medication management; continue detox and admit to inpatient psychiatric unit for stabilization  Laylonie Marzec T. 05/20/2014 12:57 PM

## 2014-05-20 NOTE — ED Notes (Signed)
In the shower 

## 2014-05-20 NOTE — BH Assessment (Addendum)
[name] Donovan, [facility], confirmed adult unit is at capacity. Contacted the following facilities for placement:  ACCEPTED TO WAIT LIST: [facility]  INFORMATION HAS BEEN FAXED AND PT IS UNDER REVIEW: [name], per [facility], per [name]  AT CAPACITY: [facility], per [facility], per [facility], per [facility], per [facility], per [facility], per [facility], per [facility], per [facility], per [facility], per [name], per [facility], per [name]  MULTIPLE CALL WITH NO RESPONSE: [facility]  PT DECLINED: [facility] (no [place] sponsorship)   [name], [facility], [facility] Triage Specialist [phone]

## 2014-05-20 NOTE — ED Notes (Signed)
Report received from Janie Rambo RN. Pt. Alert and oriented in no distress denies SI, HI, AVH and pain. Will continue to monitor for safety. Pt. Instructed to come to me with problems or concerns. Q 15 minute checks continue. 

## 2014-05-21 MED ORDER — AMLODIPINE BESYLATE 10 MG PO TABS
10.0000 mg | ORAL_TABLET | Freq: Every day | ORAL | Status: DC
  Administered 2014-05-21 – 2014-05-22 (×2): 10 mg via ORAL
  Filled 2014-05-21 (×2): qty 1

## 2014-05-21 MED ORDER — ATENOLOL 50 MG PO TABS
50.0000 mg | ORAL_TABLET | Freq: Every day | ORAL | Status: DC
  Filled 2014-05-21: qty 1

## 2014-05-21 NOTE — Progress Notes (Signed)
[facility] completed a follow up with [facility], who never received a [facility] referral.  Referral was faxed to [facility] due to dual dx with ZOXW#[id number] good til 05/27/14.  John confirmed fax was received.  Blain Pais, MHT/NS

## 2014-05-21 NOTE — Consult Note (Signed)
Psychiatry Follow Up cConsult   Total Time spent with patient: 15 minutes  Assessment: AXIS I:  Alcohol Abuse and Major Depression, Recurrent severe AXIS II:  Deferred AXIS III:   Past Medical History  Diagnosis Date  . Hypertension   . Depression    AXIS IV:  other psychosocial or environmental problems, problems related to legal system/crime, problems related to social environment and problems with primary support group AXIS V:  21-30 behavior considerably influenced by delusions or hallucinations OR serious impairment in judgment, communication OR inability to function in almost all areas  Plan:  Recommend psychiatric Inpatient admission when medically cleared.  Subjective:   [name] Donovan is a 43 y.o. male patient admitted with depression and suicidal plan.  He is drinking alcohol and has history of alcohol detox/dependency.  Patient's seen chart reviewed.  Patient remains very depressed sad and continues to have suicidal thoughts and plan to kill himself.  He is taking his medication but remains very depressed and hopeless.  He denies any side effects of medication.  He is tearful .  He continues to have thoughts and plan to hang himself.  He is on detox protocol .  His tremors or shakes are less intense from the past.  Past Psychiatric History: Past Medical History  Diagnosis Date  . Hypertension   . Depression     reports that he has been smoking Cigarettes.  He has been smoking about 0.00 packs per day. He does not have any smokeless tobacco history on file. He reports that he drinks alcohol. He reports that he uses illicit drugs (Marijuana and Cocaine) about once per week. Family History  Problem Relation Age of Onset  . Hypertension Mother   . Heart disease Father   . Heart disease Brother    Family History Substance Abuse: Yes, Describe: Biochemist, clinical ) Family Supports: No (None reported ) Living Arrangements: Other (Comment) (Homeless ) Can pt return to  current living arrangement?: Yes Abuse/Neglect [facility]) Physical Abuse: Denies Verbal Abuse: Denies Sexual Abuse: Denies Allergies:   Allergies  Allergen Reactions  . Penicillins Hives, Shortness Of Breath and Rash    dizziness    ACT Assessment Complete:  Yes:    Educational Status    Risk to Self: Risk to self with the past 6 months Suicidal Ideation: No Suicidal Intent: No Is patient at risk for suicide?: No Suicidal Plan?: No Specify Current Suicidal Plan: None  Access to Means: No Specify Access to Suicidal Means: None  What has been your use of drugs/alcohol within the last 12 months?: Absuing: alcohol, cocaine  Previous Attempts/Gestures: Yes How many times?: 2 Other Self Harm Risks: None  Triggers for Past Attempts: Unknown;Unpredictable Intentional Self Injurious Behavior: None Family Suicide History: No Recent stressful life event(s): Financial Problems;Other (Comment) Persecutory voices/beliefs?: No Depression: Yes Depression Symptoms: Loss of interest in usual pleasures;Feeling angry/irritable Substance abuse history and/or treatment for substance abuse?: Yes Suicide prevention information given to non-admitted patients: Not applicable  Risk to Others: Risk to Others within the past 6 months Homicidal Ideation: No Thoughts of Harm to Others: No Current Homicidal Intent: No Current Homicidal Plan: No Access to Homicidal Means: No Identified Victim: None  History of harm to others?: No Assessment of Violence: None Noted Violent Behavior Description: None  Does patient have access to weapons?: No Criminal Charges Pending?: Yes Describe Pending Criminal Charges: Trespassing  Does patient have a court date: Yes Court Date: 05/19/14  Abuse: Abuse/Neglect Assessment (Assessment to be complete  while patient is alone) Physical Abuse: Denies Verbal Abuse: Denies Sexual Abuse: Denies Exploitation of patient/patient's resources: Denies Self-Neglect: Denies  Prior  Inpatient Therapy: Prior Inpatient Therapy Prior Inpatient Therapy: Yes Prior Therapy Dates: 2015 Prior Therapy Facilty/Provider(s): [facility]  Reason for Treatment: SA/Depression   Prior Outpatient Therapy: Prior Outpatient Therapy Prior Outpatient Therapy: No Prior Therapy Dates: None  Prior Therapy Facilty/Provider(s): None  Reason for Treatment: None   Additional Information: Additional Information 1:1 In Past 12 Months?: No CIRT Risk: No Elopement Risk: No Does patient have medical clearance?: Yes                  Objective: Blood pressure 128/92, pulse 69, temperature 98 F (36.7 C), temperature source Oral, resp. rate 16, SpO2 99.00%.There is no weight on file to calculate BMI. Results for orders placed during the hospital encounter of 05/17/14 (from the past 72 hour(s))  URINE RAPID DRUG SCREEN (HOSP PERFORMED)     Status: None   Collection Time    05/18/14  6:53 PM      Result Value Ref Range   Opiates NONE DETECTED  NONE DETECTED   Cocaine NONE DETECTED  NONE DETECTED   Benzodiazepines NONE DETECTED  NONE DETECTED   Amphetamines NONE DETECTED  NONE DETECTED   Tetrahydrocannabinol NONE DETECTED  NONE DETECTED   Barbiturates NONE DETECTED  NONE DETECTED   Comment:            DRUG SCREEN FOR MEDICAL PURPOSES     ONLY.  IF CONFIRMATION IS NEEDED     FOR ANY PURPOSE, NOTIFY LAB     WITHIN 5 DAYS.                LOWEST DETECTABLE LIMITS     FOR URINE DRUG SCREEN     Drug Class       Cutoff (ng/mL)     Amphetamine      1000     Barbiturate      200     Benzodiazepine   200     Tricyclics       300     Opiates          300     Cocaine          300     THC              50   Labs are reviewed and are pertinent for no medical issues needing attention.  Current Facility-Administered Medications  Medication Dose Route Frequency Provider Last Rate Last Dose  . acetaminophen (TYLENOL) tablet 650 mg  650 mg Oral Q4H PRN Court Joy, PA-C      . atenolol  (TENORMIN) tablet 50 mg  50 mg Oral Daily Mirian Mo, MD      . hydrochlorothiazide (HYDRODIURIL) tablet 25 mg  25 mg Oral Daily Audree Camel, MD   25 mg at 05/21/14 1054  . ibuprofen (ADVIL,MOTRIN) tablet 600 mg  600 mg Oral Q6H PRN Court Joy, PA-C   600 mg at 05/20/14 2148  . LORazepam (ATIVAN) tablet 0-4 mg  0-4 mg Oral Q12H Purvis Sheffield, MD      . sertraline (ZOLOFT) tablet 100 mg  100 mg Oral Daily Nanine Means, NP   100 mg at 05/21/14 1054  . thiamine (VITAMIN B-1) tablet 100 mg  100 mg Oral Daily Purvis Sheffield, MD   100 mg at 05/21/14 1054   Or  . thiamine (B-1) injection 100  mg  100 mg Intravenous Daily Purvis Sheffield, MD      . traZODone (DESYREL) tablet 50 mg  50 mg Oral QHS Audree Camel, MD   50 mg at 05/20/14 2129   Current Outpatient Prescriptions  Medication Sig Dispense Refill  . hydrochlorothiazide (HYDRODIURIL) 25 MG tablet Take 25 mg by mouth daily.      Marland Kitchen lisinopril (PRINIVIL,ZESTRIL) 20 MG tablet Take 20 mg by mouth daily.      . sertraline (ZOLOFT) 50 MG tablet Take 1 tablet (50 mg total) by mouth daily. For depression  30 tablet  0  . traZODone (DESYREL) 50 MG tablet Take 1 tablet (50 mg total) by mouth at bedtime. Sleep  30 tablet  0    Psychiatric Specialty Exam:     Blood pressure 128/92, pulse 69, temperature 98 F (36.7 C), temperature source Oral, resp. rate 16, SpO2 99.00%.There is no weight on file to calculate BMI.  General Appearance: Casual  Eye Contact::  Fair  Speech:  Normal Rate  Volume:  Decreased  Mood:  Depressed  Affect:  Congruent  Thought Process:  Coherent  Orientation:  Full (Time, Place, and Person)  Thought Content:  WDL  Suicidal Thoughts:  Yes.  with intent/plan  Homicidal Thoughts:  No  Memory:  Immediate;   Fair Recent;   Fair Remote;   Fair  Judgement:  Fair  Insight:  Fair  Psychomotor Activity:  Decreased  Concentration:  Fair  Recall:  Poor  Fund of Knowledge:Fair  Language: Fair  Akathisia:   No  Handed:  Right  AIMS (if indicated):     Assets:  Leisure Time Physical Health Resilience  Sleep:      Musculoskeletal: Strength & Muscle Tone: within normal limits Gait & Station: normal Patient leans: N/A  Treatment Plan Summary: Daily contact with patient to assess and evaluate symptoms and progress in treatment Medication management; continue detox treatment.  [facility]  Patient requires inpatient treatment for stabilization.  Jovonni Borquez T. 05/21/2014 11:29 AM

## 2014-05-21 NOTE — ED Notes (Signed)
Up tot he bathroom to shower and change scrubs 

## 2014-05-21 NOTE — ED Notes (Signed)
Up to the bathroom 

## 2014-05-21 NOTE — ED Notes (Signed)
Patient lying in bed calm and cooperative, denies any SI/HI/AVH. NAD

## 2014-05-21 NOTE — ED Notes (Signed)
Dr arfeen and shuvon np into see 

## 2014-05-22 NOTE — [facility] Suicide Risk Assessment (Deleted)
Suicide Risk Assessment  Discharge Assessment     Demographic Factors:  Male  Total Time spent with patient: 25 minutes  Psychiatric Specialty Exam:      Blood pressure 151/93, pulse 65, temperature 98.2 F (36.8 C), temperature source Oral, resp. rate 16, SpO2 100.00%.There is no weight on file to calculate BMI.   General Appearance: Fairly Groomed   Patent attorney:: Good   Speech: Normal Rate   Volume: Normal   Mood: still depressed, but states that he feels better than upon admission   Affect: Congruent and Constricted   Thought Process: Coherent   Orientation: Full (Time, Place, and Person)   Thought Content: denies hallucinations and does not appear internally preoccupied. No delusions noted.   Suicidal Thoughts: No- at this time patient specifically denies any SI or HI.   Homicidal Thoughts: No   Memory: Remote and recent grossly intact   Judgement: Fair   Insight: Fair   Psychomotor Activity: Normal   Concentration: Fair   Recall: Good   Fund of Knowledge:Fair   Language: Fair   Akathisia: No   Handed: Right   AIMS (if indicated):   Assets: Leisure Time  Physical Health  Resilience   Sleep:   Musculoskeletal:  Strength & Muscle Tone: within normal limits  Gait & Station: normal  Patient leans: N/A  Mental Status Per Nursing Assessment::   On Admission:   alcohol detox/dependence, suicidal ideations  Current Mental Status by Physician: NA  Loss Factors: NA  Historical Factors: NA  Risk Reduction Factors:   Sense of responsibility to family and Positive coping skills or problem solving skills  Continued Clinical Symptoms:  Mild depression  Cognitive Features That Contribute To Risk:  None     Suicide Risk:  Minimal: No identifiable suicidal ideation.  Patients presenting with no risk factors but with morbid ruminations; may be classified as minimal risk based on the severity of the depressive symptoms  Discharge Diagnoses:   AXIS I:  Alcohol  Abuse and Depressive Disorder NOS AXIS II:  Deferred AXIS III:   Past Medical History  Diagnosis Date  . Hypertension   . Depression    AXIS IV:  other psychosocial or environmental problems, problems related to social environment and problems with primary support group AXIS V:  61-70 mild symptoms  Plan Of Care/Follow-up recommendations:  Activity:  as tolerated Diet:  low-sodium heart healthy diet  Is patient on multiple antipsychotic therapies at discharge:  No   Has Patient had three or more failed trials of antipsychotic monotherapy by history:  No  Recommended Plan for Multiple Antipsychotic Therapies: NA    LORD, JAMISON, PMH-NP 05/22/2014, 3:23 PM

## 2014-05-22 NOTE — Consult Note (Addendum)
Psychiatry Follow Up cConsult   Total Time spent with patient: 25 minutes  Assessment: AXIS I:  Alcohol Abuse and Major Depression, Recurrent severe AXIS II:  Deferred AXIS III:   Past Medical History  Diagnosis Date  . Hypertension   . Depression    AXIS IV:  other psychosocial or environmental problems, problems related to legal system/crime, problems related to social environment and problems with primary support group AXIS V:  21-30 behavior considerably influenced by delusions or hallucinations OR serious impairment in judgment, communication OR inability to function in almost all areas  Plan:  Patient not presenting with symptoms of withdrawal , and no SI /HI/psychosis. No current grounds for inpatient involuntary commitment. Staff working on disposition planning- see below.  Subjective:   Patient reports he is depressed but better than he was upon admission. He denies any current withdrawal symptoms. Objective: Patient is a 43 year old man with a history of alcohol dependence and depression, made worse by recent drinking and psychosocial stressors such as recent eviction, separation from family, homelessness. He had been drinking daily and heavily, and presented requesting detox as well. At this time he is not presenting with any alcohol withdrawal symptoms and seems calm, without tremors , diaphoresis, or agitation. BP is 151/93, pulse is 65.  Currently patient is not suicidal and is not homicidal and is not endorsing any psychotic symptoms. He is future oriented and is hoping to go to an inpatient rehabilitation program . As discussed with staff , he is being discharged, and is currently in the process of being reviewed by [facility] for possible admission to rehab program, which patient had expressed interest in. Other rehabs did not accept patient. At this time patient states he feels he is " fully detoxed already" and is " OK with being discharged". He is currently on  Zoloft/Trazodone  And denies side effects from medications at this time.    Past Psychiatric History: Past Medical History  Diagnosis Date  . Hypertension   . Depression     reports that he has been smoking Cigarettes.  He has been smoking about 0.00 packs per day. He does not have any smokeless tobacco history on file. He reports that he drinks alcohol. He reports that he uses illicit drugs (Marijuana and Cocaine) about once per week. Family History  Problem Relation Age of Onset  . Hypertension Mother   . Heart disease Father   . Heart disease Brother    Family History Substance Abuse: Yes, Describe: Biochemist, clinical ) Family Supports: No (None reported ) Living Arrangements: Other (Comment) (Homeless ) Can pt return to current living arrangement?: Yes Abuse/Neglect [facility]) Physical Abuse: Denies Verbal Abuse: Denies Sexual Abuse: Denies Allergies:   Allergies  Allergen Reactions  . Penicillins Hives, Shortness Of Breath and Rash    dizziness  . Lisinopril Swelling    ACT Assessment Complete:  Yes:    Educational Status    Risk to Self: Risk to self with the past 6 months Suicidal Ideation: No Suicidal Intent: No Is patient at risk for suicide?: No Suicidal Plan?: No Specify Current Suicidal Plan: None  Access to Means: No Specify Access to Suicidal Means: None  What has been your use of drugs/alcohol within the last 12 months?: Absuing: alcohol, cocaine  Previous Attempts/Gestures: Yes How many times?: 2 Other Self Harm Risks: None  Triggers for Past Attempts: Unknown;Unpredictable Intentional Self Injurious Behavior: None Family Suicide History: No Recent stressful life event(s): Financial Problems;Other (Comment) Persecutory  voices/beliefs?: No Depression: Yes Depression Symptoms: Loss of interest in usual pleasures;Feeling angry/irritable Substance abuse history and/or treatment for substance abuse?: Yes Suicide prevention information given to non-admitted  patients: Not applicable  Risk to Others: Risk to Others within the past 6 months Homicidal Ideation: No Thoughts of Harm to Others: No Current Homicidal Intent: No Current Homicidal Plan: No Access to Homicidal Means: No Identified Victim: None  History of harm to others?: No Assessment of Violence: None Noted Violent Behavior Description: None  Does patient have access to weapons?: No Criminal Charges Pending?: Yes Describe Pending Criminal Charges: Trespassing  Does patient have a court date: Yes Court Date: 05/19/14  Abuse: Abuse/Neglect Assessment (Assessment to be complete while patient is alone) Physical Abuse: Denies Verbal Abuse: Denies Sexual Abuse: Denies Exploitation of patient/patient's resources: Denies Self-Neglect: Denies  Prior Inpatient Therapy: Prior Inpatient Therapy Prior Inpatient Therapy: Yes Prior Therapy Dates: 2015 Prior Therapy Facilty/Provider(s): [facility]  Reason for Treatment: SA/Depression   Prior Outpatient Therapy: Prior Outpatient Therapy Prior Outpatient Therapy: No Prior Therapy Dates: None  Prior Therapy Facilty/Provider(s): None  Reason for Treatment: None   Additional Information: Additional Information 1:1 In Past 12 Months?: No CIRT Risk: No Elopement Risk: No Does patient have medical clearance?: Yes    Objective: Blood pressure 151/93, pulse 65, temperature 98.2 F (36.8 C), temperature source Oral, resp. rate 16, SpO2 100.00%.There is no weight on file to calculate BMI. No results found for this or any previous visit (from the past 72 hour(s)). Labs are reviewed and are pertinent for no medical issues needing attention.  Current Facility-Administered Medications  Medication Dose Route Frequency Provider Last Rate Last Dose  . acetaminophen (TYLENOL) tablet 650 mg  650 mg Oral Q4H PRN Court Joy, PA-C      . amLODipine (NORVASC) tablet 10 mg  10 mg Oral Daily Mirian Mo, MD   10 mg at 05/22/14 1000  . hydrochlorothiazide  (HYDRODIURIL) tablet 25 mg  25 mg Oral Daily Audree Camel, MD   25 mg at 05/22/14 1000  . ibuprofen (ADVIL,MOTRIN) tablet 600 mg  600 mg Oral Q6H PRN Court Joy, PA-C   600 mg at 05/21/14 1958  . sertraline (ZOLOFT) tablet 100 mg  100 mg Oral Daily Nanine Means, NP   100 mg at 05/22/14 1000  . thiamine (VITAMIN B-1) tablet 100 mg  100 mg Oral Daily Purvis Sheffield, MD   100 mg at 05/22/14 1000   Or  . thiamine (B-1) injection 100 mg  100 mg Intravenous Daily Purvis Sheffield, MD      . traZODone (DESYREL) tablet 50 mg  50 mg Oral QHS Audree Camel, MD   50 mg at 05/21/14 2149   Current Outpatient Prescriptions  Medication Sig Dispense Refill  . hydrochlorothiazide (HYDRODIURIL) 25 MG tablet Take 25 mg by mouth daily.      Marland Kitchen lisinopril (PRINIVIL,ZESTRIL) 20 MG tablet Take 20 mg by mouth daily.      . sertraline (ZOLOFT) 50 MG tablet Take 1 tablet (50 mg total) by mouth daily. For depression  30 tablet  0  . traZODone (DESYREL) 50 MG tablet Take 1 tablet (50 mg total) by mouth at bedtime. Sleep  30 tablet  0    Psychiatric Specialty Exam:     Blood pressure 151/93, pulse 65, temperature 98.2 F (36.8 C), temperature source Oral, resp. rate 16, SpO2 100.00%.There is no weight on file to calculate BMI.  General Appearance: Fairly Groomed  Patent attorney::  Good   Speech:  Normal Rate  Volume:  Normal  Mood:  still depressed, but states that he feels better than upon admission  Affect:  Congruent and Constricted  Thought Process:  Coherent  Orientation:  Full (Time, Place, and Person)  Thought Content:  denies hallucinations and does not appear internally preoccupied. No delusions noted.  Suicidal Thoughts:  No- at this time patient specifically denies any SI or HI.   Homicidal Thoughts:  No  Memory:  Remote and recent grossly intact   Judgement:  Fair  Insight:  Fair  Psychomotor Activity:  Normal  Concentration:  Fair  Recall:  Good  Fund of Knowledge:Fair  Language:  Fair  Akathisia:  No  Handed:  Right  AIMS (if indicated):     Assets:  Leisure Time Physical Health Resilience  Sleep:      Musculoskeletal: Strength & Muscle Tone: within normal limits Gait & Station: normal Patient leans: N/A  Assessment- patient is not currently presenting with any alcohol withdrawal symptoms. He is still depressed,  But  not suicidal or homicidal and is not psychotic. As per above there are no current grounds for involuntary commitment at this time. He is tolerating zoloft trial well.  Treatment Plan Summary: Daily contact with patient to assess and evaluate symptoms and progress in treatment Medication management; continue detox treatment.    As discussed with staff, patient will be discharged - there are no current grounds for any involuntary commitment.  As per staff, patient is currently awaiting decision from [facility] regarding being accepted there. He states he will maintain phone contact with [facility] on a regular basis to determine when bed becomes available. He is being continued to Zoloft 100 mgrs Daily Patient has been given Biochemist, clinical for outpatient psychiatrists, providers, instructed on [facility] admission process if he is accepted there, and provided with list of local homeless shelters.  Wandalene Abrams, Madaline Guthrie 05/22/2014 2:55 PM

## 2014-05-22 NOTE — ED Notes (Signed)
Pt. Discharged after instructions with bus pass.

## 2014-05-22 NOTE — ED Notes (Signed)
Pt. C/o increasing anxiety about being transferred to [facility].

## 2014-05-22 NOTE — ED Notes (Signed)
Report received from [facility]. Pt. Alert and oriented in no distress. Will continue to monitor for safety. Pt. Instructed to come to me with problems or concerns. Q 15 minute checks continue.

## 2014-05-22 NOTE — [facility] Suicide Risk Assessment (Signed)
Demographic Factors:  43 year old man, married, currently homeless  Total Time spent with patient: 25 minutes   Psychiatric Specialty Exam: Physical Exam  ROS  Blood pressure 151/93, pulse 65, temperature 98.2 F (36.8 C), temperature source Oral, resp. rate 16, SpO2 100.00%.There is no weight on file to calculate BMI.  General Appearance: Fairly Groomed  Patent attorney::  Good  Speech:  Normal Rate  Volume:  Decreased  Mood:  Depressed and but states feeling better than upon admission  Affect:  Constricted  Thought Process:  Goal Directed and Linear  Orientation:  Other:  fully alert and attentive  Thought Content:  no hallucinations,no delusions  Suicidal Thoughts:  No- at this time denies any suicidal or homicidal ideations  Homicidal Thoughts:  No  Memory:  recent and remote grossly intact   Judgement:  Fair  Insight:  Fair  Psychomotor Activity:  Normal  Concentration:  Fair  Recall:  Good  Fund of Knowledge:Good  Language: Good  Akathisia:  Negative  Handed:  Right  AIMS (if indicated):     Assets:  Communication Skills Desire for Improvement Resilience  Sleep:       Musculoskeletal: Strength & Muscle Tone: within normal limits- at this time no tremors, no diaphoresis, no acute distress  Gait & Station: normal Patient leans: N/A   Mental Status Per Nursing Assessment::   On Admission:     Current Mental Status by Physician: As above, some improvement, but still depressed , with a slightly costricted affect, although he does smile at times appropriately. Not suicidal or homicidal , not psychotic at this time.   Loss Factors: homelessness, limited support network, currently separated from his children  Historical Factors: history of depression and history of prior suicide attempts but overdosing  Risk Reduction Factors:   Sense of responsibility to family and Positive coping skills or problem solving skills  Continued Clinical Symptoms:  Remains  depressed, but states his mood is somewhat better than upon admission and he is not suicidal or homicidal or endorsing hallucinations,delusions. He is not currently presenting with active or severe alcohol withdrawals and these are less likely now, since his  Date of last alcohol consumption was 9/23.  Cognitive Features That Contribute To Risk:  No gross cognitive deficits noted, patient is alert, attentive, oriented  Suicide Risk:  Mild:  Suicidal ideation of limited frequency, intensity, duration, and specificity.  There are no identifiable plans, no associated intent, mild dysphoria and related symptoms, good self-control (both objective and subjective assessment), few other risk factors, and identifiable protective factors, including available and accessible social support.  Discharge Diagnoses:   AXIS I:  Alcohol Dependence, Alcohol Induced Mood Disorder, Depression NOS, consider MDD.  AXIS II:  Deferred AXIS III:   Past Medical History  Diagnosis Date  . Hypertension   . Depression    AXIS IV:  homelessness, financial difficulties AXIS V:  51-60 moderate symptoms  Plan Of Care/Follow-up recommendations:  Activity:  As tolerated Diet:  low sodium Tests:  NA Other:  See below  Is patient on multiple antipsychotic therapies at discharge:  No   Has Patient had three or more failed trials of antipsychotic monotherapy by history:  No  Recommended Plan for Multiple Antipsychotic Therapies: NA  Patient is being discharged with a plan to follow up at [facility]. He is also advised to maintain contact with [facility] ( residential rehab setting) , to determine when a bed will become available for him. Patient will be given  resource lists for local shelters and he agrees to return to ER if any worsening prior.  COBOS, FERNANDO 05/22/2014, 3:15 PM

## 2014-05-22 NOTE — Progress Notes (Signed)
MHT confirmed that [facility] referral received per [facility] and would have a medical review in the AM.  Blain Pais, MHT/NS

## 2014-05-22 NOTE — B[facility] Counselor (Signed)
Writer gave pt resources including info for [facility] and other inpt and outpt substance abuse facilities. Writer also gave pt homeless shelters resources and bus pass. Pt indicates he will contact inpt facilities.   Evette Cristal, [place] Assessment Counselor

## 2014-08-31 ENCOUNTER — Encounter (HOSPITAL_COMMUNITY): Payer: Self-pay | Admitting: Cardiology

## 2014-08-31 ENCOUNTER — Emergency Department (HOSPITAL_COMMUNITY)
Admission: EM | Admit: 2014-08-31 | Discharge: 2014-08-31 | Disposition: A | Discharge status: Home / Self Care | Payer: Self-pay | Attending: Emergency Medicine | Admitting: Emergency Medicine

## 2014-08-31 ENCOUNTER — Emergency Department (HOSPITAL_COMMUNITY): Payer: Self-pay

## 2014-08-31 DIAGNOSIS — M2391 Unspecified internal derangement of right knee: Secondary | ICD-10-CM

## 2014-08-31 DIAGNOSIS — I10 Essential (primary) hypertension: Secondary | ICD-10-CM | POA: Insufficient documentation

## 2014-08-31 DIAGNOSIS — W228XXA Striking against or struck by other objects, initial encounter: Secondary | ICD-10-CM | POA: Insufficient documentation

## 2014-08-31 DIAGNOSIS — Z79899 Other long term (current) drug therapy: Secondary | ICD-10-CM | POA: Insufficient documentation

## 2014-08-31 DIAGNOSIS — Y998 Other external cause status: Secondary | ICD-10-CM | POA: Insufficient documentation

## 2014-08-31 DIAGNOSIS — Y9289 Other specified places as the place of occurrence of the external cause: Secondary | ICD-10-CM | POA: Insufficient documentation

## 2014-08-31 DIAGNOSIS — Z720 Tobacco use: Secondary | ICD-10-CM | POA: Insufficient documentation

## 2014-08-31 DIAGNOSIS — Z880 Allergy status to penicillin: Secondary | ICD-10-CM | POA: Insufficient documentation

## 2014-08-31 DIAGNOSIS — S83206A Unspecified tear of unspecified meniscus, current injury, right knee, initial encounter: Secondary | ICD-10-CM | POA: Insufficient documentation

## 2014-08-31 DIAGNOSIS — S6991XA Unspecified injury of right wrist, hand and finger(s), initial encounter: Secondary | ICD-10-CM | POA: Insufficient documentation

## 2014-08-31 DIAGNOSIS — F329 Major depressive disorder, single episode, unspecified: Secondary | ICD-10-CM | POA: Insufficient documentation

## 2014-08-31 DIAGNOSIS — Y9389 Activity, other specified: Secondary | ICD-10-CM | POA: Insufficient documentation

## 2014-08-31 DIAGNOSIS — M25531 Pain in right wrist: Secondary | ICD-10-CM

## 2014-08-31 MED ORDER — IBUPROFEN 800 MG PO TABS: 800.0000 mg | ORAL_TABLET | Freq: Three times a day (TID) | ORAL | Status: DC

## 2014-08-31 MED ORDER — HYDROCODONE-ACETAMINOPHEN 5-325 MG PO TABS: 1.0000 | ORAL_TABLET | Freq: Four times a day (QID) | ORAL | Status: DC | PRN

## 2014-08-31 NOTE — Discharge Instructions (Signed)
Your knee injury may be related to a ligament injury or a meniscal injury.  Wear knee sleeve for support and follow up with orthopedic specialist for further care.  Your right wrist pain may be related to carpal tunnel syndrome.  Wear velcro splint for support.  Follow instruction below    Knee Effusion  Knee effusion means you have fluid in your knee. The knee may be more difficult to bend and move. HOME CARE  Use crutches or a brace as told by your doctor.  Put ice on the injured area.  Put ice in a plastic bag.  Place a towel between your skin and the bag.  Leave the ice on for 15-20 minutes, 03-04 times a day.  Raise (elevate) your knee as much as possible.  Only take medicine as told by your doctor.  You may need to do strengthening exercises. Ask your doctor.  Continue with your normal diet and activities as told by your doctor. GET HELP RIGHT AWAY IF:  You have more puffiness (swelling) in your knee.  You see redness, puffiness, or have more pain in your knee.  You have a temperature by mouth above 102 F (38.9 C).  You get a rash.  You have trouble breathing.  You have a reaction to any medicine you are taking.  You have a lot of pain when you move your knee. MAKE SURE YOU:  Understand these instructions.  Will watch your condition.  Will get help right away if you are not doing well or get worse. Document Released: 09/13/2010 Document Revised: 11/03/2011 Document Reviewed: 09/13/2010 [facility] Patient Information 2015 [place], [place]. This information is not intended to replace advice given to you by your health care provider. Make sure you discuss any questions you have with your health care provider.

## 2014-08-31 NOTE — ED Notes (Signed)
Clarified with Greta DoomBowie, GeorgiaPA, patient may use knee sling for support instead of ace wrap as he requested. PA student at the bedside.

## 2014-08-31 NOTE — ED Notes (Signed)
Pt reports that he stepped on a curb and twisted his right knee. Reports increased pain with movement and weight.

## 2014-08-31 NOTE — ED Provider Notes (Signed)
CSN: [id number]     Arrival date & time 08/31/14  1849 History   First MD Initiated Contact with Patient 08/31/14 2001     Chief Complaint  Patient presents with  . Knee Pain     (Consider location/radiation/quality/duration/timing/severity/associated sxs/prior Treatment) HPI   44 year old male who presents for evaluation of right knee injury. Patient work at a [employer].  Yesterday he stepped on a curve and landed directly on his right knee against ground striking her head against the ground but no loss of consciousness. He was able to stand up and ambulate and continue working. Today he noticed increasing pain to the right knee worsened with movement and weightbearing. He stepped on a railing of a van and noticed a pop in the medial aspect of his right knee with subsequently worsened sharp pain to the knee with ambulation.  Patient also requests for evaluation of right wrist pain. Patient reports intermittent pain to his right wrist ongoing for the past year. States that pain sometimes associated with numbness and tingling sensation to his second and third finger. Denies any trauma. Pain is described as a sharp shooting sensation radiates to his fingers.  Past Medical History  Diagnosis Date  . Hypertension   . Depression    History reviewed. No pertinent past surgical history. Family History  Problem Relation Age of Onset  . Hypertension Mother   . Heart disease Father   . Heart disease Brother    History  Substance Use Topics  . Smoking status: Current Every Day Smoker    Types: Cigarettes  . Smokeless tobacco: Not on file  . Alcohol Use: Yes    Review of Systems  Constitutional: Negative for fever.  Musculoskeletal: Positive for joint swelling and arthralgias.  Skin: Negative for wound.  Neurological: Negative for numbness.      Allergies  Penicillins and Lisinopril  Home Medications   Prior to Admission medications   Medication Sig Start Date End Date  Taking? Authorizing Provider  hydrochlorothiazide (HYDRODIURIL) 25 MG tablet Take 25 mg by mouth daily.    Historical Provider, MD  lisinopril (PRINIVIL,ZESTRIL) 20 MG tablet Take 20 mg by mouth daily.    Historical Provider, MD  sertraline (ZOLOFT) 50 MG tablet Take 1 tablet (50 mg total) by mouth daily. For depression 04/26/14   Sanjuana Kava, NP  traZODone (DESYREL) 50 MG tablet Take 1 tablet (50 mg total) by mouth at bedtime. Sleep 04/26/14   Sanjuana Kava, NP   BP 148/83 mmHg  Pulse 82  Temp(Src) 98.3 F (36.8 C) (Oral)  Resp 18  Ht 6' (1.829 m)  Wt 363 lb 8 oz (164.883 kg)  BMI 49.29 kg/m2  SpO2 95% Physical Exam  Constitutional: He appears well-developed and well-nourished. No distress.  HENT:  Head: Atraumatic.  Eyes: Conjunctivae are normal.  Neck: Normal range of motion. Neck supple.  Musculoskeletal: He exhibits tenderness (R wrist: mild tenderness to palpation, +Tinel sign, decreased grip strength.  Radial pulse 2+, FROM, no edema or gross deformity.  brisk cap refill distally.).  Neurological: He is alert.  Skin: No rash noted.  Psychiatric: He has a normal mood and affect.    ED Course  Procedures (including critical care time)  9:19 PM Patient with mechanical injury to his right knee. X-ray demonstrates evidence of suprapatellar effusion. Suspect ligamentous injury versus meniscal tear. Knee sleeve provided for support and patient will follow-up with orthopedic Dr. for further management. He is able to ambulate. He is  neurovascular intact. He also complaining of intermittent right wrist pain high suggestive of carpal tunnel syndrome. He will be giving a wrist splint for support as well.  Labs Review Labs Reviewed - No data to display  Imaging Review Dg Knee Complete 4 Views Right  08/31/2014   CLINICAL DATA:  Twisted right knee today with knee pain.  EXAM: RIGHT KNEE - COMPLETE 4+ VIEW  COMPARISON:  None.  FINDINGS: There is no evidence of fracture, dislocation. There  is a small suprapatellar effusion. Soft tissues are unremarkable.  IMPRESSION: No acute fracture or dislocation identified. Small suprapatellar effusion.   Electronically Signed   By: Sherian ReinWei-Chen  Lin M.D.   On: 08/31/2014 19:41     EKG Interpretation None      MDM   Final diagnoses:  Acute internal derangement of right knee  Wrist pain, right    BP 148/83 mmHg  Pulse 82  Temp(Src) 98.3 F (36.8 C) (Oral)  Resp 18  Ht 6' (1.829 m)  Wt 363 lb 8 oz (164.883 kg)  BMI 49.29 kg/m2  SpO2 95%  I have reviewed nursing notes and vital signs. I personally reviewed the imaging tests through PACS system  I reviewed available ER/hospitalization records thought the EMR     Fayrene HelperBowie Kriston Pasquarello, PA-C 08/31/14 2140  Tilden FossaElizabeth Rees, MD 09/01/14 [phone]

## 2014-08-31 NOTE — ED Notes (Signed)
Wrist splint applied to right upper wrist, ace wraps provided, applied knee sleeve.

## 2014-11-04 ENCOUNTER — Emergency Department (HOSPITAL_COMMUNITY): Payer: Self-pay

## 2014-11-04 ENCOUNTER — Observation Stay (HOSPITAL_COMMUNITY)
Admission: EM | Admit: 2014-11-04 | Discharge: 2014-11-07 | Disposition: A | Discharge status: Home / Self Care | Payer: Self-pay | Attending: Infectious Disease | Admitting: Infectious Disease

## 2014-11-04 ENCOUNTER — Encounter (HOSPITAL_COMMUNITY): Payer: Self-pay

## 2014-11-04 DIAGNOSIS — Z880 Allergy status to penicillin: Secondary | ICD-10-CM | POA: Insufficient documentation

## 2014-11-04 DIAGNOSIS — F332 Major depressive disorder, recurrent severe without psychotic features: Secondary | ICD-10-CM | POA: Diagnosis present

## 2014-11-04 DIAGNOSIS — G4733 Obstructive sleep apnea (adult) (pediatric): Secondary | ICD-10-CM | POA: Diagnosis present

## 2014-11-04 DIAGNOSIS — Z720 Tobacco use: Secondary | ICD-10-CM | POA: Insufficient documentation

## 2014-11-04 DIAGNOSIS — I10 Essential (primary) hypertension: Secondary | ICD-10-CM | POA: Insufficient documentation

## 2014-11-04 DIAGNOSIS — R0602 Shortness of breath: Secondary | ICD-10-CM | POA: Insufficient documentation

## 2014-11-04 DIAGNOSIS — R079 Chest pain, unspecified: Principal | ICD-10-CM | POA: Insufficient documentation

## 2014-11-04 DIAGNOSIS — Z79899 Other long term (current) drug therapy: Secondary | ICD-10-CM | POA: Insufficient documentation

## 2014-11-04 DIAGNOSIS — F329 Major depressive disorder, single episode, unspecified: Secondary | ICD-10-CM | POA: Insufficient documentation

## 2014-11-04 DIAGNOSIS — F10230 Alcohol dependence with withdrawal, uncomplicated: Secondary | ICD-10-CM | POA: Diagnosis present

## 2014-11-04 LAB — CBC
HCT: 42.4 % (ref 39.0–52.0)
Hemoglobin: 13.8 g/dL (ref 13.0–17.0)
MCH: 24 pg — AB (ref 26.0–34.0)
MCHC: 32.5 g/dL (ref 30.0–36.0)
MCV: 73.9 fL — ABNORMAL LOW (ref 78.0–100.0)
Platelets: 212 10*3/uL (ref 150–400)
RBC: 5.74 MIL/uL (ref 4.22–5.81)
RDW: 14.3 % (ref 11.5–15.5)
WBC: 8.7 10*3/uL (ref 4.0–10.5)

## 2014-11-04 LAB — BASIC METABOLIC PANEL
Anion gap: 8 (ref 5–15)
BUN: 17 mg/dL (ref 6–23)
CHLORIDE: 106 mmol/L (ref 96–112)
CO2: 26 mmol/L (ref 19–32)
Calcium: 9.1 mg/dL (ref 8.4–10.5)
Creatinine, Ser: 1.5 mg/dL — ABNORMAL HIGH (ref 0.50–1.35)
GFR calc Af Amer: 64 mL/min — ABNORMAL LOW (ref >90)
GFR, EST NON AFRICAN AMERICAN: 55 mL/min — AB (ref >90)
GLUCOSE: 125 mg/dL — AB (ref 70–99)
Potassium: 3.7 mmol/L (ref 3.5–5.1)
Sodium: 140 mmol/L (ref 135–145)

## 2014-11-04 LAB — I-STAT TROPONIN, ED: TROPONIN I, POC: 0.01 ng/mL (ref 0.00–0.08)

## 2014-11-04 LAB — BRAIN NATRIURETIC PEPTIDE: B Natriuretic Peptide: 12.5 pg/mL (ref 0.0–100.0)

## 2014-11-04 MED ORDER — ASPIRIN 81 MG PO CHEW
324.0000 mg | CHEWABLE_TABLET | Freq: Once | ORAL | Status: AC
  Administered 2014-11-04: 324 mg via ORAL
  Filled 2014-11-04: qty 4

## 2014-11-04 MED ORDER — MORPHINE SULFATE 4 MG/ML IJ SOLN: 4.0000 mg | Freq: Once | INTRAMUSCULAR | Status: DC

## 2014-11-04 MED ORDER — NITROGLYCERIN 0.4 MG SL SUBL
SUBLINGUAL_TABLET | SUBLINGUAL | Status: AC
Start: 2014-11-04 — End: 2014-11-06
  Filled 2014-11-04: qty 1

## 2014-11-04 MED ORDER — NITROGLYCERIN 0.4 MG SL SUBL
0.4000 mg | SUBLINGUAL_TABLET | SUBLINGUAL | Status: DC | PRN
  Administered 2014-11-04: 0.4 mg via SUBLINGUAL

## 2014-11-04 NOTE — ED Notes (Signed)
Onset 3 hours ago left chest pain, constant pressure, shortness of breath and lower back pain.  No recent illness.  Pt able to talk in complete sentences.

## 2014-11-04 NOTE — ED Provider Notes (Signed)
CSN: [id number]     Arrival date & time 11/04/14  1821 History   First MD Initiated Contact with Patient 11/04/14 2213     Chief Complaint  Patient presents with  . Chest Pain     (Consider location/radiation/quality/duration/timing/severity/associated sxs/prior Treatment) HPI Comments: Patient with past medical history remarkable for hypertension, and obesity presents emergency department with chief complaint of chest pain. 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 He reports associated shortness of breath, and states that the pain did radiate to his left arm. Denies any radiation to the jaw. Denies any abnormal diaphoresis. Patient states that his pain was a 6 out of 10, but is currently a 3 out of 10. He has never been seen by cardiologist. Cardiac risk factors include hypertension, obesity, family history, and smoking history. There are no aggravating or alleviating factors.  The history is provided by the patient. No language interpreter was used.    Past Medical History  Diagnosis Date  . Hypertension   . Depression    Past Surgical History  Procedure Laterality Date  . Wrist surgery     Family History  Problem Relation Age of Onset  . Hypertension Mother   . Heart disease Father   . Heart disease Brother    History  Substance Use Topics  . Smoking status: Light Tobacco Smoker    Types: Cigarettes  . Smokeless tobacco: Not on file  . Alcohol Use: No    Review of Systems  Constitutional: Negative for fever and chills.  Respiratory: Positive for shortness of breath.   Cardiovascular: Positive for chest pain.  Gastrointestinal: Negative for nausea, vomiting, diarrhea and constipation.  Genitourinary: Negative for dysuria.  All other systems reviewed and are negative.     Allergies  Penicillins; Lipitor; and Lisinopril  Home Medications   Prior to Admission medications    Medication Sig Start Date End Date Taking? Authorizing Provider  hydrochlorothiazide (HYDRODIURIL) 25 MG tablet Take 25 mg by mouth daily.    Historical Provider, MD  HYDROcodone-acetaminophen (NORCO/VICODIN) 5-325 MG per tablet Take 1 tablet by mouth every 6 (six) hours as needed for moderate pain or severe pain. 08/31/14   Fayrene Helper, PA-C  ibuprofen (ADVIL,MOTRIN) 800 MG tablet Take 1 tablet (800 mg total) by mouth 3 (three) times daily. 08/31/14   Fayrene Helper, PA-C  lisinopril (PRINIVIL,ZESTRIL) 20 MG tablet Take 20 mg by mouth daily.    Historical Provider, MD  sertraline (ZOLOFT) 50 MG tablet Take 1 tablet (50 mg total) by mouth daily. For depression 04/26/14   Sanjuana Kava, NP  traZODone (DESYREL) 50 MG tablet Take 1 tablet (50 mg total) by mouth at bedtime. Sleep 04/26/14   Sanjuana Kava, NP   BP 180/101 mmHg  Pulse 95  Temp(Src) 98.2 F (36.8 C) (Oral)  Resp 20  Ht 6' (1.829 m)  Wt 376 lb 1.6 oz (170.598 kg)  BMI 51.00 kg/m2  SpO2 95% Physical Exam  Constitutional: He is oriented to person, place, and time. He appears well-developed and well-nourished.  HENT:  Head: Normocephalic and atraumatic.  Eyes: Conjunctivae and EOM are normal. Pupils are equal, round, and reactive to light. Right eye exhibits no discharge. Left eye exhibits no discharge. No scleral icterus.  Neck: Normal range of motion. Neck supple. No JVD present.  Cardiovascular: Normal rate, regular rhythm and normal heart sounds.  Exam reveals no gallop and no friction rub.  No murmur heard. Pulmonary/Chest: Effort normal and breath sounds normal. No respiratory distress. He has no wheezes. He has no rales. He exhibits no tenderness.  Abdominal: Soft. He exhibits no distension and no mass. There is no tenderness. There is no rebound and no guarding.  Musculoskeletal: Normal range of motion. He exhibits no edema or tenderness.  Mild bilateral lower extremity edema with sock line  Neurological: He is alert and oriented to  person, place, and time.  Skin: Skin is warm and dry.  Psychiatric: He has a normal mood and affect. His behavior is normal. Judgment and thought content normal.  Nursing note and vitals reviewed.   ED Course  Procedures (including critical care time) Labs Review Labs Reviewed  CBC - Abnormal; Notable for the following:    MCV 73.9 (*)    MCH 24.0 (*)    All other components within normal limits  BASIC METABOLIC PANEL - Abnormal; Notable for the following:    Glucose, Bld 125 (*)    Creatinine, Ser 1.50 (*)    GFR calc non Af Amer 55 (*)    GFR calc Af Amer 64 (*)    All other components within normal limits  BRAIN NATRIURETIC PEPTIDE  I-STAT TROPOININ, ED    Imaging Review Dg Chest 2 View  11/04/2014   CLINICAL DATA:  44 year old male with left-sided chest pain today. Recent history of left elbow numbness. Shortness of breath.  EXAM: CHEST  2 VIEW  COMPARISON:  Chest x-ray 04/28/2013.  FINDINGS: Lung volumes are normal. No consolidative airspace disease. No pleural effusions. No pneumothorax. No pulmonary nodule or mass noted. Pulmonary vasculature and the cardiomediastinal silhouette are within normal limits.  IMPRESSION: No radiographic evidence of acute cardiopulmonary disease.   Electronically Signed   By: Trudie Reedaniel  Entrikin M.D.   On: 11/04/2014 19:45     EKG Interpretation   Date/Time:  Saturday November 04 2014 18:30:41 EST Ventricular Rate:  85 PR Interval:  150 QRS Duration: 90 QT Interval:  360 QTC Calculation: 428 R Axis:   71 Text Interpretation:  Normal sinus rhythm Nonspecific ST abnormality  Abnormal ECG nonspecific ST abnormality new compared to prior Confirmed by  [facility]OFF[facility]  MD, TREY ([id number]) on 11/04/2014 11:26:49 PM      MDM   Final diagnoses:  Chest pain    Patient with chest pain that started earlier today. Doubt PE, patient is PERC negative.  Some concern for ACS, given patient's risk factors. Heart score is 4. Initial troponin is negative, EKG  remarkable for nonspecific ST changes. Will treat with aspirin and sublingual nitroglycerin. Pain is currently a 3 out of 10.  Patient discussed with Dr. Loretha StaplerWofford, who recommends admission. Patient is unassigned. He states that he does not have a primary care provider.  Patient discussed with internal medicine residents, who will admit the patient. Patient understands and agrees with the plan.    Roxy Horsemanobert Richanda Darin, PA-C 11/04/14 2354  Blake DivineJohn Wofford, MD 11/05/14 [phone]

## 2014-11-04 NOTE — H&P (Signed)
Date: 11/05/2014               Patient Name:  [name] Donovan MRN: [id number]  DOB: [birth year] Age / Sex: 44 y.o., male   PCP: Quentin Angst, MD              Medical Service: Internal Medicine Teaching Service              Attending Physician: Dr. Randall Hiss, MD    First Contact: Dr. Loma Newton Pager: [phone]  Second Contact: Dr. Mikey Bussing Pager: [phone]            After Hours (After 5p/  First Contact Pager: [phone]  weekends / holidays): Second Contact Pager: [phone]   Chief Complaint:  Chest pain.  History of Present Illness: [name] Donovan is a 44 year old man with history of hypertension, depression, and obesity presenting with chest pain. 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 He says that the pain radiated to his left arm, but he denies nausea, vomiting, or headache. He says the pain was initially 6 out of 10, but decreased to 3 out of 10 while in the waiting room. He currently says he is not having any chest pain.  He says he has had mild episodes of chest pain with exertion in the past but not to this extent.  He denies any previous history of coronary artery disease, anxiety, or GERD. He says he saw a cardiologist approximately 15 years ago in [place]. He reportedly had stress testing which was normal, and he has not seen a cardiologist since then.  He was previously seen in the [facility] and [facility], but he does not follow regularly with a primary care doctor. He says that his brother and father both have a rare heart disease, but he does not know the name of the condition. His brother died from the heart condition. He says that he was previously tested and found not to have the disease.  In the ER, initial EKG and troponin were negative. He was given aspirin and nitroglycerin.  Review of Systems: Review of Systems  Constitutional: Positive for  diaphoresis. Negative for fever and chills.  HENT: Negative for congestion and sore throat.   Eyes: Negative for blurred vision.  Respiratory: Positive for shortness of breath. Negative for cough and hemoptysis.   Cardiovascular: Positive for chest pain and leg swelling (Occasionally after long periods of standing.). Negative for palpitations.  Gastrointestinal: Negative for nausea, vomiting, diarrhea and constipation.  Genitourinary: Negative for dysuria.  Musculoskeletal: Negative for myalgias and joint pain.  Skin: Negative for rash.  Neurological: Negative for dizziness, sensory change, speech change, focal weakness and headaches.    Meds: Medications Prior to Admission  Medication Sig Dispense Refill  . amLODipine (NORVASC) 5 MG tablet Take 5 mg by mouth daily.    . hydrochlorothiazide (HYDRODIURIL) 25 MG tablet Take 25 mg by mouth daily.    Marland Kitchen ibuprofen (ADVIL,MOTRIN) 200 MG tablet Take 600-800 mg by mouth every 8 (eight) hours as needed for moderate pain.    . metoprolol tartrate (LOPRESSOR) 25 MG tablet Take 25 mg by mouth 2 (two) times daily.    Marland Kitchen HYDROcodone-acetaminophen (NORCO/VICODIN) 5-325 MG per tablet Take 1 tablet by mouth every 6 (six) hours as needed for moderate pain or severe pain. (Patient not taking: Reported on 11/05/2014) 6 tablet 0  . ibuprofen (ADVIL,MOTRIN) 800 MG tablet Take 1  tablet (800 mg total) by mouth 3 (three) times daily. (Patient not taking: Reported on 11/05/2014) 21 tablet 0  . sertraline (ZOLOFT) 50 MG tablet Take 1 tablet (50 mg total) by mouth daily. For depression (Patient not taking: Reported on 11/05/2014) 30 tablet 0  . traZODone (DESYREL) 50 MG tablet Take 1 tablet (50 mg total) by mouth at bedtime. Sleep (Patient not taking: Reported on 11/05/2014) 30 tablet 0   Current Facility-Administered Medications  Medication Dose Route Frequency Provider Last Rate Last Dose  . acetaminophen (TYLENOL) tablet 650 mg  650 mg Oral Q6H PRN Adrian Blackwater Cason Dabney, MD       . amLODipine (NORVASC) tablet 5 mg  5 mg Oral Daily Annett Gula, MD      . aspirin EC tablet 81 mg  81 mg Oral Daily Annett Gula, MD      . enoxaparin (LOVENOX) injection 40 mg  40 mg Subcutaneous Q24H Annett Gula, MD      . hydrochlorothiazide (HYDRODIURIL) tablet 25 mg  25 mg Oral Daily Annett Gula, MD      . metoprolol tartrate (LOPRESSOR) tablet 25 mg  25 mg Oral BID Annett Gula, MD      . nitroGLYCERIN (NITROSTAT) SL tablet 0.4 mg  0.4 mg Sublingual Q5 Min x 3 PRN Roxy Horseman, PA-C   0.4 mg at 11/04/14 2257  . sodium chloride 0.9 % injection 3 mL  3 mL Intravenous Q12H Annett Gula, MD        Allergies: Allergies as of 11/04/2014 - Review Complete 11/04/2014  Allergen Reaction Noted  . Penicillins Hives, Shortness Of Breath, and Rash 01/05/2012  . Lipitor [atorvastatin]  11/04/2014  . Lisinopril Swelling 05/21/2014   Past Medical History  Diagnosis Date  . Hypertension   . Depression    Past Surgical History  Procedure Laterality Date  . Wrist surgery     Family History  Problem Relation Age of Onset  . Hypertension Mother   . Heart disease Father     Rare heart disease  . Heart disease Brother     Rare heart disease (died)   History   Social History  . Marital Status: Single    Spouse Name: N/A  . Number of Children: N/A  . Years of Education: N/A   Occupational History  . Not on file.   Social History Main Topics  . Smoking status: Light Tobacco Smoker -- 0.20 packs/day    Types: Cigarettes  . Smokeless tobacco: Never Used  . Alcohol Use: No  . Drug Use: No  . Sexual Activity: Not on file   Other Topics Concern  . Not on file   Social History Narrative   Sister is a family medicine doctor in [place].    Physical Exam: Filed Vitals:   11/05/14 0138  BP: 151/91  Pulse: 72  Temp: 98.1 F (36.7 C)  Resp: 19   Physical Exam  Constitutional: He is oriented to person, place, and time and well-developed, well-nourished, and  in no distress. No distress.  Obese.  HENT:  Head: Normocephalic and atraumatic.  Eyes: Conjunctivae and EOM are normal. Pupils are equal, round, and reactive to light. No scleral icterus.  Neck: Normal range of motion. Neck supple. No thyromegaly present.  Cardiovascular: Normal rate, regular rhythm and normal heart sounds.   Pulmonary/Chest: Effort normal and breath sounds normal. No respiratory distress. He exhibits no tenderness.  Abdominal: Soft. Bowel sounds are normal. He exhibits no  distension. There is no tenderness.  Musculoskeletal: Normal range of motion. He exhibits edema (trace lower extremity). He exhibits no tenderness.  Neurological: He is alert and oriented to person, place, and time. No cranial nerve deficit. He exhibits normal muscle tone.  Skin: Skin is warm and dry. No rash noted. He is not diaphoretic. No erythema.    Lab results: Basic Metabolic Panel:  Recent Labs  16/05/9602/12/16 1849  NA 140  K 3.7  CL 106  CO2 26  GLUCOSE 125*  BUN 17  CREATININE 1.50*  CALCIUM 9.1   CBC:  Recent Labs  11/04/14 1849  WBC 8.7  HGB 13.8  HCT 42.4  MCV 73.9*  PLT 212   Cardiac Enzymes:  Recent Labs  11/05/14 0006  TROPONINI <0.03   Urine Drug Screen: Drugs of Abuse     Component Value Date/Time   LABOPIA NONE DETECTED 11/05/2014 0030   COCAINSCRNUR NONE DETECTED 11/05/2014 0030   LABBENZ NONE DETECTED 11/05/2014 0030   AMPHETMU NONE DETECTED 11/05/2014 0030   THCU NONE DETECTED 11/05/2014 0030   LABBARB NONE DETECTED 11/05/2014 0030    Alcohol Level:  Recent Labs  11/05/14 0006  ETH <5   Imaging results:  Dg Chest 2 View  11/04/2014   CLINICAL DATA:  44 year old male with left-sided chest pain today. Recent history of left elbow numbness. Shortness of breath.  EXAM: CHEST  2 VIEW  COMPARISON:  Chest x-ray 04/28/2013.  FINDINGS: Lung volumes are normal. No consolidative airspace disease. No pleural effusions. No pneumothorax. No pulmonary nodule or  mass noted. Pulmonary vasculature and the cardiomediastinal silhouette are within normal limits.  IMPRESSION: No radiographic evidence of acute cardiopulmonary disease.   Electronically Signed   By: Trudie Reedaniel  Entrikin M.D.   On: 11/04/2014 19:45    Other results: EKG: Normal sinus rhythm, possible left atrial enlargement, no signs of ischemia, nonspecific repolarization changes.  Assessment & Plan by Problem: Principal Problem:   Chest pain Active Problems:   Morbid obesity   HTN (hypertension)   #Chest pain Heart score of 4 due to suspicious chest pain, nonspecific ST changes, and risk factors for heart disease.. Negative initial troponin and EKG. Chest x-ray unremarkable. Received aspirin 324 mg in the ED. -Admit to telemetry. -Trend troponins and EKG. -Continue aspirin 81 mg daily. -Continue sublingual nitroglycerin as needed. -Check HIV antibody. -Check hemoglobin A1c and lipid panel. -Acetaminophen 650 mg every 6 hours when necessary. -Consult cardiology in the morning for possible stress testing. -Nothing by mouth for possible stress testing tomorrow. -Patient will call sister in the morning to inquire about the heart problem that runs in his family.  #Hypertension Blood pressure elevated to 180/101 in the ER. Not taking lisinopril due to facial swelling. -Continue home HCTZ 25 mg daily, amlodipine 5 mg daily, and metoprolol 25 mg twice a day.  #Acute kidney injury Creatinine elevated at 1.5. It was pre-BC elevated at 1.38 earlier this year, but was 1.12 in 2014. It is unclear what his current baseline is. -Repeat BMP in the morning.  #Depression Previously hospitalized in September 2015 with alcohol intoxication and suicidal ideation. Denies any dysphoric mood currently. Was previously on Zoloft and trazodone, but he says he does not take them currently. -No treatment currently.  #DVT prophylaxis -Lovenox.  Dispo: Disposition is deferred at this time, awaiting  improvement of current medical problems. Anticipated discharge in approximately 1-2 day(s).   The patient does have a current PCP (Quentin Angstlugbemiga E Jegede, MD), therefore will not require  OPC follow-up after discharge.   The patient does not have transportation limitations that hinder transportation to clinic appointments.   Signed:  Luisa Dago, MD, PhD PGY-1 Internal Medicine Teaching Service Pager: [phone] 11/05/2014, 3:11 AM

## 2014-11-05 ENCOUNTER — Observation Stay (HOSPITAL_COMMUNITY): Payer: Self-pay

## 2014-11-05 ENCOUNTER — Encounter (HOSPITAL_COMMUNITY): Payer: Self-pay | Admitting: Internal Medicine

## 2014-11-05 DIAGNOSIS — R079 Chest pain, unspecified: Secondary | ICD-10-CM

## 2014-11-05 DIAGNOSIS — I10 Essential (primary) hypertension: Secondary | ICD-10-CM

## 2014-11-05 DIAGNOSIS — Z590 Homelessness: Secondary | ICD-10-CM

## 2014-11-05 DIAGNOSIS — F329 Major depressive disorder, single episode, unspecified: Secondary | ICD-10-CM

## 2014-11-05 DIAGNOSIS — E785 Hyperlipidemia, unspecified: Secondary | ICD-10-CM

## 2014-11-05 DIAGNOSIS — Z8249 Family history of ischemic heart disease and other diseases of the circulatory system: Secondary | ICD-10-CM

## 2014-11-05 DIAGNOSIS — N179 Acute kidney failure, unspecified: Secondary | ICD-10-CM

## 2014-11-05 DIAGNOSIS — F17210 Nicotine dependence, cigarettes, uncomplicated: Secondary | ICD-10-CM

## 2014-11-05 DIAGNOSIS — R072 Precordial pain: Secondary | ICD-10-CM

## 2014-11-05 DIAGNOSIS — Z7982 Long term (current) use of aspirin: Secondary | ICD-10-CM

## 2014-11-05 LAB — TROPONIN I
Troponin I: <0.03 ng/mL (ref <0.031)
Troponin I: <0.03 ng/mL (ref <0.031)

## 2014-11-05 LAB — BASIC METABOLIC PANEL
ANION GAP: 6 (ref 5–15)
BUN: 15 mg/dL (ref 6–23)
CO2: 28 mmol/L (ref 19–32)
Calcium: 9 mg/dL (ref 8.4–10.5)
Chloride: 104 mmol/L (ref 96–112)
Creatinine, Ser: 1.24 mg/dL (ref 0.50–1.35)
GFR calc Af Amer: 81 mL/min — ABNORMAL LOW (ref >90)
GFR calc non Af Amer: 70 mL/min — ABNORMAL LOW (ref >90)
Glucose, Bld: 107 mg/dL — ABNORMAL HIGH (ref 70–99)
Potassium: 3.6 mmol/L (ref 3.5–5.1)
Sodium: 138 mmol/L (ref 135–145)

## 2014-11-05 LAB — HIV ANTIBODY (ROUTINE TESTING W REFLEX): HIV SCREEN 4TH GENERATION: NONREACTIVE

## 2014-11-05 LAB — RAPID URINE DRUG SCREEN, HOSP PERFORMED
AMPHETAMINES: NOT DETECTED
Barbiturates: NOT DETECTED
Benzodiazepines: NOT DETECTED
Cocaine: NOT DETECTED
Opiates: NOT DETECTED
TETRAHYDROCANNABINOL: NOT DETECTED

## 2014-11-05 LAB — LIPID PANEL
CHOLESTEROL: 200 mg/dL (ref 0–200)
HDL: 34 mg/dL — AB (ref >39)
LDL Cholesterol: 130 mg/dL — ABNORMAL HIGH (ref 0–99)
TRIGLYCERIDES: 181 mg/dL — AB (ref <150)
Total CHOL/HDL Ratio: 5.9 RATIO
VLDL: 36 mg/dL (ref 0–40)

## 2014-11-05 LAB — ETHANOL: Alcohol, Ethyl (B): <5 mg/dL (ref 0–9)

## 2014-11-05 MED ORDER — ENOXAPARIN SODIUM 80 MG/0.8ML ~~LOC~~ SOLN: 80.0000 mg | SUBCUTANEOUS | Status: DC

## 2014-11-05 MED ORDER — ACETAMINOPHEN 325 MG PO TABS
650.0000 mg | ORAL_TABLET | Freq: Four times a day (QID) | ORAL | Status: DC | PRN
  Administered 2014-11-05 – 2014-11-06 (×3): 650 mg via ORAL
  Filled 2014-11-05 (×2): qty 2

## 2014-11-05 MED ORDER — METOPROLOL TARTRATE 25 MG PO TABS
25.0000 mg | ORAL_TABLET | Freq: Two times a day (BID) | ORAL | Status: DC
Start: 2014-11-05 — End: 2014-11-07
  Administered 2014-11-05 – 2014-11-07 (×6): 25 mg via ORAL
  Filled 2014-11-05 (×6): qty 1

## 2014-11-05 MED ORDER — ACETAMINOPHEN 325 MG PO TABS: 650.0000 mg | ORAL_TABLET | Freq: Four times a day (QID) | ORAL | Status: DC | PRN

## 2014-11-05 MED ORDER — ACETAMINOPHEN 325 MG PO TABS
ORAL_TABLET | ORAL | Status: AC
  Filled 2014-11-05: qty 2

## 2014-11-05 MED ORDER — ENOXAPARIN SODIUM 80 MG/0.8ML ~~LOC~~ SOLN
80.0000 mg | SUBCUTANEOUS | Status: DC
  Administered 2014-11-06 – 2014-11-07 (×2): 80 mg via SUBCUTANEOUS
  Filled 2014-11-05 (×2): qty 0.8

## 2014-11-05 MED ORDER — SODIUM CHLORIDE 0.9 % IJ SOLN
3.0000 mL | Freq: Two times a day (BID) | INTRAMUSCULAR | Status: DC
  Administered 2014-11-05 – 2014-11-07 (×5): 3 mL via INTRAVENOUS

## 2014-11-05 MED ORDER — AMLODIPINE BESYLATE 5 MG PO TABS
5.0000 mg | ORAL_TABLET | Freq: Every day | ORAL | Status: DC
  Administered 2014-11-05 – 2014-11-07 (×3): 5 mg via ORAL
  Filled 2014-11-05 (×3): qty 1

## 2014-11-05 MED ORDER — REGADENOSON 0.4 MG/5ML IV SOLN
0.4000 mg | Freq: Once | INTRAVENOUS | Status: AC
  Administered 2014-11-05: 0.4 mg via INTRAVENOUS
  Filled 2014-11-05: qty 5

## 2014-11-05 MED ORDER — REGADENOSON 0.4 MG/5ML IV SOLN
INTRAVENOUS | Status: AC
  Filled 2014-11-05: qty 5

## 2014-11-05 MED ORDER — ACETAMINOPHEN 325 MG PO TABS: 650.0000 mg | ORAL_TABLET | Freq: Once | ORAL | Status: AC

## 2014-11-05 MED ORDER — HYDROCHLOROTHIAZIDE 25 MG PO TABS
25.0000 mg | ORAL_TABLET | Freq: Every day | ORAL | Status: DC
  Administered 2014-11-05 – 2014-11-07 (×3): 25 mg via ORAL
  Filled 2014-11-05 (×3): qty 1

## 2014-11-05 MED ORDER — TECHNETIUM TC 99M SESTAMIBI GENERIC - CARDIOLITE
30.0000 | Freq: Once | INTRAVENOUS | Status: AC | PRN
  Administered 2014-11-05: 30 via INTRAVENOUS

## 2014-11-05 MED ORDER — ASPIRIN EC 81 MG PO TBEC
81.0000 mg | DELAYED_RELEASE_TABLET | Freq: Every day | ORAL | Status: DC
  Administered 2014-11-05 – 2014-11-07 (×3): 81 mg via ORAL
  Filled 2014-11-05 (×3): qty 1

## 2014-11-05 NOTE — Progress Notes (Addendum)
ANTICOAGULATION CONSULT NOTE - Initial Consult  Pharmacy Consult for Lovenox Indication: VTE prophylaxis  Allergies  Allergen Reactions  . Penicillins Hives, Shortness Of Breath and Rash    dizziness  . Lipitor [Atorvastatin]     Cramping all over body   . Lisinopril Swelling    Patient Measurements: Height: 6' (182.9 cm) Weight: (!) 370 lb 8 oz (168.058 kg) IBW/kg (Calculated) : 77.6   Vital Signs: Temp: 98.1 F (36.7 C) (03/13 0138) Temp Source: Oral (03/13 0138) BP: 151/91 mmHg (03/13 0138) Pulse Rate: 72 (03/13 0138)  Labs:  Recent Labs  11/04/14 1849 11/05/14 0006  HGB 13.8  --   HCT 42.4  --   PLT 212  --   CREATININE 1.50*  --   TROPONINI  --  <0.03   CRCL ~ 70 ml/min (IBW 77.6 kg) Estimated Creatinine Clearance: 102.2 mL/min (by C-G formula based on Cr of 1.5).   Medical History: Past Medical History  Diagnosis Date  . Hypertension   . Depression     Assessment: 44 y.o obese male presented to ED for chest pain. Pharmacy consulted to adjust lovenox for VTE  prophylaxis dose.  Weight 168.1 kg,  Height 72 inches, IBW 77.6 kg,  Acute kidney injury- SCr 1.5 , estimated CrCl ~ 70 ml/min (based on IBW).   CBC within normal. BMI 50.4.   Will adjust dose to 0.5mg /kg SQ q24h    Goal of Therapy:  antiXa level 0.3-0.6 units/ml 4 hours after lovenox dose given Monitor platelets by anticoagulation protocol: Yes   Plan:  Lovenox 80 mg SQ q24 hour.  Monitor renal function, CBC.   Noah Delaineuth Charbel Los, RPh Clinical Pharmacist Pager: [phone] 11/05/2014,3:08 AM

## 2014-11-05 NOTE — Consult Note (Signed)
Reason for Consult:   Chest  Requesting Physician: Family Practice  HPI:   This is a 44 y.o. male with a past medical history significant for morbid obesity, HTN, past history of ETOH dependence with admissions for withdrawl, depression with suicidal ideations requiring hospitalization in the past, and homelessness- currently living at [facility]. He has not been drinking and his drug screen is negative. He indicates he has been taking his medications for HTN.              Pt was admitted 11/04/14 with complaints of Lt sided chest "pressure". We have been asked to evaluate. Apparently the pt's sister is an MD. The pt says his father died at 30 of a cardiomyopathy and his brother also died in his 83's of a cardiomyopathy. The pt;s sister had him evaluated by a cardiologist several years ago and he was told he was "OK". His pain is localized and not associated with SOB, radiation, or nausea.   PMHx:  Past Medical History  Diagnosis Date  . Hypertension   . Depression     Past Surgical History  Procedure Laterality Date  . Wrist surgery      SOCHx:  reports that he has been smoking Cigarettes.  He has been smoking about 0.20 packs per day. He has never used smokeless tobacco. He reports that he does not drink alcohol or use illicit drugs.  FAMHx: Family History  Problem Relation Age of Onset  . Hypertension Mother   . Heart disease Father     Rare heart disease  . Heart disease Brother     Rare heart disease (died)    ALLERGIES: Allergies  Allergen Reactions  . Penicillins Hives, Shortness Of Breath and Rash    dizziness  . Lipitor [Atorvastatin]     Cramping all over body   . Lisinopril Swelling    ? angioedema    ROS: Pertinent items are noted in HPI. see H&P for complete details  HOME MEDICATIONS: Prior to Admission medications   Medication Sig Start Date End Date Taking? Authorizing Provider  amLODipine (NORVASC) 5 MG tablet Take 5 mg by mouth  daily.   Yes Historical Provider, MD  hydrochlorothiazide (HYDRODIURIL) 25 MG tablet Take 25 mg by mouth daily.   Yes Historical Provider, MD  ibuprofen (ADVIL,MOTRIN) 200 MG tablet Take 600-800 mg by mouth every 8 (eight) hours as needed for moderate pain.   Yes Historical Provider, MD  metoprolol tartrate (LOPRESSOR) 25 MG tablet Take 25 mg by mouth 2 (two) times daily.   Yes Historical Provider, MD  HYDROcodone-acetaminophen (NORCO/VICODIN) 5-325 MG per tablet Take 1 tablet by mouth every 6 (six) hours as needed for moderate pain or severe pain. Patient not taking: Reported on 11/05/2014 08/31/14   Domenic Moras, PA-C  ibuprofen (ADVIL,MOTRIN) 800 MG tablet Take 1 tablet (800 mg total) by mouth 3 (three) times daily. Patient not taking: Reported on 11/05/2014 08/31/14   Domenic Moras, PA-C  sertraline (ZOLOFT) 50 MG tablet Take 1 tablet (50 mg total) by mouth daily. For depression Patient not taking: Reported on 11/05/2014 04/26/14   Encarnacion Slates, NP  traZODone (DESYREL) 50 MG tablet Take 1 tablet (50 mg total) by mouth at bedtime. Sleep Patient not taking: Reported on 11/05/2014 04/26/14   Encarnacion Slates, NP    HOSPITAL MEDICATIONS: I have reviewed the patient's current medications.  VITALS: Blood pressure 151/91, pulse 72, temperature 98.1 F (36.7 C), temperature  source Oral, resp. rate 19, height 6' (1.829 m), weight 370 lb 8 oz (168.058 kg), SpO2 97 %.  PHYSICAL EXAM: General appearance: alert, cooperative, no distress and morbidly obese Neck: no carotid bruit and no JVD Lungs: clear to auscultation bilaterally Heart: regular rate and rhythm, S1, S2 normal, no murmur, click, rub or gallop Abdomen: obese Extremities: extremities normal, atraumatic, no cyanosis or edema Pulses: 2+ and symmetric Skin: Skin color, texture, turgor normal. No rashes or lesions Neurologic: Grossly normal  LABS: Results for orders placed or performed during the hospital encounter of 11/04/14 (from the past 24 hour(s))   CBC     Status: Abnormal   Collection Time: 11/04/14  6:49 PM  Result Value Ref Range   WBC 8.7 4.0 - 10.5 K/uL   RBC 5.74 4.22 - 5.81 MIL/uL   Hemoglobin 13.8 13.0 - 17.0 g/dL   HCT 42.4 39.0 - 52.0 %   MCV 73.9 (L) 78.0 - 100.0 fL   MCH 24.0 (L) 26.0 - 34.0 pg   MCHC 32.5 30.0 - 36.0 g/dL   RDW 14.3 11.5 - 15.5 %   Platelets 212 150 - 400 K/uL  Basic metabolic panel     Status: Abnormal   Collection Time: 11/04/14  6:49 PM  Result Value Ref Range   Sodium 140 135 - 145 mmol/L   Potassium 3.7 3.5 - 5.1 mmol/L   Chloride 106 96 - 112 mmol/L   CO2 26 19 - 32 mmol/L   Glucose, Bld 125 (H) 70 - 99 mg/dL   BUN 17 6 - 23 mg/dL   Creatinine, Ser 1.50 (H) 0.50 - 1.35 mg/dL   Calcium 9.1 8.4 - 10.5 mg/dL   GFR calc non Af Amer 55 (L) >90 mL/min   GFR calc Af Amer 64 (L) >90 mL/min   Anion gap 8 5 - 15  BNP (order ONLY if patient complains of dyspnea/SOB AND you have documented it for THIS visit)     Status: None   Collection Time: 11/04/14  6:49 PM  Result Value Ref Range   B Natriuretic Peptide 12.5 0.0 - 100.0 pg/mL  I-stat troponin, ED (not at [facility])     Status: None   Collection Time: 11/04/14  7:24 PM  Result Value Ref Range   Troponin i, poc 0.01 0.00 - 0.08 ng/mL   Comment 3          Ethanol     Status: None   Collection Time: 11/05/14 12:06 AM  Result Value Ref Range   Alcohol, Ethyl (B) <5 0 - 9 mg/dL  Troponin I (q 6hr x 3)     Status: None   Collection Time: 11/05/14 12:06 AM  Result Value Ref Range   Troponin I <0.03 <0.031 ng/mL  Urine rapid drug screen (hosp performed)     Status: None   Collection Time: 11/05/14 12:30 AM  Result Value Ref Range   Opiates NONE DETECTED NONE DETECTED   Cocaine NONE DETECTED NONE DETECTED   Benzodiazepines NONE DETECTED NONE DETECTED   Amphetamines NONE DETECTED NONE DETECTED   Tetrahydrocannabinol NONE DETECTED NONE DETECTED   Barbiturates NONE DETECTED NONE DETECTED  Troponin I (q 6hr x 3)     Status: None   Collection Time:  11/05/14  4:49 AM  Result Value Ref Range   Troponin I <0.03 <0.031 ng/mL  Basic metabolic panel     Status: Abnormal   Collection Time: 11/05/14  4:49 AM  Result Value Ref Range  Sodium 138 135 - 145 mmol/L   Potassium 3.6 3.5 - 5.1 mmol/L   Chloride 104 96 - 112 mmol/L   CO2 28 19 - 32 mmol/L   Glucose, Bld 107 (H) 70 - 99 mg/dL   BUN 15 6 - 23 mg/dL   Creatinine, Ser 1.24 0.50 - 1.35 mg/dL   Calcium 9.0 8.4 - 10.5 mg/dL   GFR calc non Af Amer 70 (L) >90 mL/min   GFR calc Af Amer 81 (L) >90 mL/min   Anion gap 6 5 - 15    EKG:  SR no acute ST changes   IMAGING: Dg Chest 2 View  11/04/2014   CLINICAL DATA:  44 year old male with left-sided chest pain today. Recent history of left elbow numbness. Shortness of breath.  EXAM: CHEST  2 VIEW  COMPARISON:  Chest x-ray 04/28/2013.  FINDINGS: Lung volumes are normal. No consolidative airspace disease. No pleural effusions. No pneumothorax. No pulmonary nodule or mass noted. Pulmonary vasculature and the cardiomediastinal silhouette are within normal limits.  IMPRESSION: No radiographic evidence of acute cardiopulmonary disease.   Electronically Signed   By: Vinnie Langton M.D.   On: 11/04/2014 19:45    IMPRESSION: Principal Problem:   Chest pain Active Problems:   Accelerated hypertension   Obstructive sleep apnea   Morbid obesity-BMI 50   History of Alcohol dependence with withdrawal   History of recurrent major depression-severe   RECOMMENDATION: 2 day protocol Myoview ordered. Consider echo as well.   Time Spent Directly with Patient: 40 minutes  Erlene Quan [phone] beeper 11/05/2014, 9:17 AM   Patient examined chart reviewed discussed with primary on phone.  Atypical symptoms ECG with poor R wave progression ? LVH.  No acute ST changes  Obesity unable to walk on treadmill  To adequate MET level Lexiscan ordered Will likely need two day protocol  Continue beta blocker , norvasc and diuretic for BP  Jenkins Rouge

## 2014-11-05 NOTE — Progress Notes (Signed)
Subjective:  Patient states that his chest pain is improved this morning but he is still having some mild residual pressure. He is overall in good spirits this morning. Patient states that he is homeless and is staying at the [facility] house for now. 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 He states that he 2-3 cigarettes a day and has been smoking for around 20 years. He states that he has a family history of some type of cardiomyopathy, though does not know what type exactly. He states that he has been tested for this in the past and was found to be negative for this.   Objective: Vital signs in last 24 hours: Filed Vitals:   11/05/14 0015 11/05/14 0030 11/05/14 0040 11/05/14 0138  BP: 142/93 143/92  151/91  Pulse: 66 66  72  Temp:   98.4 F (36.9 C) 98.1 F (36.7 C)  TempSrc:   Oral Oral  Resp: Height:    6' (1.829 m)  Weight:    370 lb 8 oz (168.058 kg)  SpO2: 100% 97%  97%   Weight change:   Intake/Output Summary (Last 24 hours) at 11/05/14 0813 Last data filed at 11/05/14 0355  Gross per 24 hour  Intake      3 ml  Output      0 ml  Net      3 ml    General: resting in bed, in no acute distress HEENT: PERRL, EOMI, no scleral icterus Cardiac: RRR, no rubs, murmurs or gallops Pulm: clear to auscultation bilaterally, moving normal volumes of air Abd: soft, nontender, nondistended, BS present Ext: warm and well perfused, no pedal edema Neuro: alert and oriented X3, cranial nerves II-XII grossly intact Skin: no rashes or lesions noted Psych: appropriate affect  Lab Results: Basic Metabolic Panel:  Recent Labs Lab 11/04/14 1849 11/05/14 0449  NA 140 138  K 3.7 3.6  CL 106 104  CO2 26 28  GLUCOSE 125* 107*  BUN 17 15  CREATININE 1.50* 1.24  CALCIUM 9.1 9.0   Liver Function Tests: No results for input(s): AST, ALT, ALKPHOS, BILITOT, PROT, ALBUMIN in the last 168 hours. No results for input(s): LIPASE, AMYLASE in the last 168 hours. No  results for input(s): AMMONIA in the last 168 hours. CBC:  Recent Labs Lab 11/04/14 1849  WBC 8.7  HGB 13.8  HCT 42.4  MCV 73.9*  PLT 212   Cardiac Enzymes:  Recent Labs Lab 11/05/14 0006 11/05/14 0449  TROPONINI <0.03 <0.03   BNP: No results for input(s): PROBNP in the last 168 hours. D-Dimer: No results for input(s): DDIMER in the last 168 hours. CBG: No results for input(s): GLUCAP in the last 168 hours. Hemoglobin A1C: No results for input(s): HGBA1C in the last 168 hours. Fasting Lipid Panel: No results for input(s): CHOL, HDL, LDLCALC, TRIG, CHOLHDL, LDLDIRECT in the last 168 hours. Thyroid Function Tests: No results for input(s): TSH, T4TOTAL, FREET4, T3FREE, THYROIDAB in the last 168 hours. Coagulation: No results for input(s): LABPROT, INR in the last 168 hours. Anemia Panel: No results for input(s): VITAMINB12, FOLATE, FERRITIN, TIBC, IRON, RETICCTPCT in the last 168 hours. Urine Drug Screen: Drugs of Abuse     Component Value Date/Time   LABOPIA NONE DETECTED 11/05/2014 0030   COCAINSCRNUR NONE DETECTED 11/05/2014 0030   LABBENZ NONE DETECTED 11/05/2014 0030   AMPHETMU NONE DETECTED 11/05/2014 0030   THCU NONE DETECTED 11/05/2014 0030   LABBARB NONE  DETECTED 11/05/2014 0030    Alcohol Level:  Recent Labs Lab 11/05/14 0006  ETH <5   Urinalysis: No results for input(s): COLORURINE, LABSPEC, PHURINE, GLUCOSEU, HGBUR, BILIRUBINUR, KETONESUR, PROTEINUR, UROBILINOGEN, NITRITE, LEUKOCYTESUR in the last 168 hours.  Invalid input(s): APPERANCEUR  Micro Results: No results found for this or any previous visit (from the past 240 hour(s)). Studies/Results: Dg Chest 2 View  11/04/2014   CLINICAL DATA:  44 year old male with left-sided chest pain today. Recent history of left elbow numbness. Shortness of breath.  EXAM: CHEST  2 VIEW  COMPARISON:  Chest x-ray 04/28/2013.  FINDINGS: Lung volumes are normal. No consolidative airspace disease. No pleural  effusions. No pneumothorax. No pulmonary nodule or mass noted. Pulmonary vasculature and the cardiomediastinal silhouette are within normal limits.  IMPRESSION: No radiographic evidence of acute cardiopulmonary disease.   Electronically Signed   By: Trudie Reedaniel  Entrikin M.D.   On: 11/04/2014 19:45   Medications: I have reviewed the patient's current medications. Scheduled Meds: . amLODipine  5 mg Oral Daily  . aspirin EC  81 mg Oral Daily  . enoxaparin (LOVENOX) injection  80 mg Subcutaneous Q24H  . hydrochlorothiazide  25 mg Oral Daily  . metoprolol tartrate  25 mg Oral BID  . sodium chloride  3 mL Intravenous Q12H   Continuous Infusions:  PRN Meds:.acetaminophen, nitroGLYCERIN Assessment/Plan: Principal Problem:   Chest pain Active Problems:   Morbid obesity   HTN (hypertension)  Chest pain: Patient's chest pressure symptoms are slightly improved since initial presentation. Exertional chest pain with a heart score of 4. EKG has been unremarkable for evidence of ischemic change and troponins have been negative x3. Chest x-ray unremarkable.  -Cardiology ordered a 2 day Myoview protocol given predicted intolerance of  -Continue aspirin 81 mg daily. -Continue sublingual nitroglycerin as needed. -HIV antibody pending -Hemoglobin A1c and lipid panel pending. -Acetaminophen 650 mg every 6 hours when necessary. -Consult cardiology in the morning for possible stress testing. -Patient will call sister in the morning to inquire about the heart problem that runs in his family.  Hypertension: Patient has remained normotensive today. Patient is not taking lisinopril due to facial swelling. -Continue home HCTZ 25 mg daily, amlodipine 5 mg daily, and metoprolol 25 mg twice a day.  Acute kidney injury: Resolved. Patient presenting with an elevated creatinine of 1.50 up from a baseline of 1.1. Creatinine back down to 1.24 this morning.  Depression: No acute issues during this hospitalization.  Previously hospitalized in September 2015 with alcohol intoxication and suicidal ideation. Denies any dysphoric mood currently. Was previously on Zoloft and trazodone, but he says he does not take them currently. -No treatment currently.  DVT prophylaxis -Lovenox.  Dispo: Disposition is deferred at this time, awaiting improvement of current medical problems. Anticipated discharge in approximately 1-2 day(s).   The patient does have a current PCP (Quentin Angstlugbemiga E Jegede, MD), therefore will not require OPC follow-up after discharge.   The patient does not have transportation limitations that hinder transportation to clinic appointments.     Services Needed at time of discharge: Y = Yes, Blank = No PT:   OT:   RN:   Equipment:   Other:    Harold BarbanLawrence Camila Norville, MD 11/05/2014, 8:13 AM

## 2014-11-05 NOTE — Progress Notes (Signed)
First portion of two day Lexiscan completed. Pt tolerated well. Final result will be out Monday after stress images done.  Corine ShelterLUKE Zehra Rucci PA-C 11/05/2014 11:00 AM

## 2014-11-05 NOTE — Progress Notes (Signed)
Internal Medicine Daily Progress Note  SUBJECTIVE/24 HOUR EVENTS: NAEON. [name] Donovan is still experiencing chest pressure, particularly near left lower sternal border, but has no pain. We discussed his history of alcohol abuse and SI but he seems to be doing very well now, has been sober for 5 months and has safe and stable housing at the [facility] house. He states he was never on antidepressants as an outpatient and does not feel the need for treatment at this time. He states that his mood is good and denies SI.  OBJECTIVE: Filed Vitals:   11/05/14 0015 11/05/14 0030 11/05/14 0040 11/05/14 0138  BP: 142/93 143/92  151/91  Pulse: 66 66  72  Temp:   98.4 F (36.9 C) 98.1 F (36.7 C)  TempSrc:   Oral Oral  Resp: Height:    6' (1.829 m)  Weight:    168.058 kg (370 lb 8 oz)  SpO2: 100% 97%  97%    Intake/Output Summary (Last 24 hours) at 11/05/14 1019 Last data filed at 11/05/14 0355  Gross per 24 hour  Intake      3 ml  Output      0 ml  Net      3 ml    Intake/Output last 3 shifts: I/O last 3 completed shifts: In: 3 [I.V.:3] Out: -   Medications: Scheduled Meds: . amLODipine  5 mg Oral Daily  . aspirin EC  81 mg Oral Daily  . enoxaparin (LOVENOX) injection  80 mg Subcutaneous Q24H  . hydrochlorothiazide  25 mg Oral Daily  . metoprolol tartrate  25 mg Oral BID  . regadenoson  0.4 mg Intravenous Once  . sodium chloride  3 mL Intravenous Q12H   Continuous Infusions:  PRN Meds:.acetaminophen, nitroGLYCERIN  Physical Exam: Gen: well appearing in NAD CV: RRR, no r/m/g Pulm: CTAB, no wheezes Abd: soft, NTND normoactive bowel sounds Psych: euthymic, cooperative, with goal-directed and linear thought process and normal speech rate and volume. No SI.   Labs: Recent Labs     11/04/14  1849  11/05/14  0449  HGB  13.8   --   HCT  42.4   --   PLT  212   --   NA  140  138  K  3.7  3.6  CL  106  104  CO2  26  28  BUN  17  15  CREATININE  1.50*  1.24  CALCIUM   9.1  9.0    Vitals, labs, and images reviewed.  Principal Problem:   Chest pain Active Problems:   Accelerated hypertension   Obstructive sleep apnea   Morbid obesity-BMI 50   History of Alcohol dependence with withdrawal   History of recurrent major depression-severe  ASSESSMENT AND PLAN:  [name] Donovan is a pleasant 44 yo male with a history of HTN, depression, obesity and EtOH dependence with resolved chest pain but persistent chest pressure.  #Chest pain Heart score of 4 due to suspicious chest pain, nonspecific ST changes, and risk factors for heart disease. Negative troponin x 3 and EKG. Chest x-ray unremarkable. Received aspirin 324 mg in the ED. -Cardiology recommended 2 day protocol Myoview because patient is unable to walk on treadmill due to obesity -Consider echocardiogram given FH of cardiomyopathy and hx of EtOH dependence  -Continue telemetry. -Continue aspirin 81 mg daily. -Patient will call sister this morning to inquire about the heart problem that runs in his family. --Encourage smoking cessation (patient currently  smokes 2 cigarettes per day.)  [ ]  f/u HIV antibody, hemoglobin A1c and lipid panel.  #Hypertension Blood pressure elevated to 180/101 in the ER. Not taking lisinopril due to facial swelling. -Continue home HCTZ 25 mg daily, amlodipine 5 mg daily, and metoprolol 25 mg twice a day.  #Acute kidney injury Creatinine today 1.24, down from 1.5 on admission. It was elevated at 1.38 earlier this year, but was 1.12 in 2014. It is unclear what his current baseline is. -Repeat BMP tomorrow --Encourage  PO hydration, pt normally drinks 4 small cups of liquid per day  #Depression Previously hospitalized in September 2015 with alcohol intoxication and suicidal ideation. Denies any dysphoric mood currently. Was on Zoloft and trazodone for 1 week while he was inpatient. -No treatment desired currently.  #DVT prophylaxis:  In morbidly-obese patients (BMI ?40 kg/m2),  increasing the prophylactic dose by 30% may be appropriate. Pt has a BMI of 50.  -Lovenox 40 mg sub Q BID, consider increasing to 50 mg   The patient does have a current PCP (Olugbemiga Annitta NeedsE Jegede, MD), therefore will not require OPC follow-up after discharge.

## 2014-11-05 NOTE — ED Notes (Signed)
MD at bedside. 

## 2014-11-05 NOTE — Progress Notes (Signed)
Utilization review completed.  P.J. Stori Royse,RN,BSN Case Manager 

## 2014-11-06 ENCOUNTER — Observation Stay (HOSPITAL_COMMUNITY): Payer: Self-pay

## 2014-11-06 DIAGNOSIS — F1021 Alcohol dependence, in remission: Secondary | ICD-10-CM

## 2014-11-06 DIAGNOSIS — R079 Chest pain, unspecified: Secondary | ICD-10-CM

## 2014-11-06 LAB — BASIC METABOLIC PANEL
ANION GAP: 4 — AB (ref 5–15)
BUN: 18 mg/dL (ref 6–23)
CALCIUM: 8.9 mg/dL (ref 8.4–10.5)
CHLORIDE: 102 mmol/L (ref 96–112)
CO2: 31 mmol/L (ref 19–32)
CREATININE: 1.27 mg/dL (ref 0.50–1.35)
GFR calc Af Amer: 79 mL/min — ABNORMAL LOW (ref >90)
GFR calc non Af Amer: 68 mL/min — ABNORMAL LOW (ref >90)
GLUCOSE: 96 mg/dL (ref 70–99)
Potassium: 4.1 mmol/L (ref 3.5–5.1)
Sodium: 137 mmol/L (ref 135–145)

## 2014-11-06 LAB — HEMOGLOBIN A1C
Hgb A1c MFr Bld: 6.1 % — ABNORMAL HIGH (ref 4.8–5.6)
MEAN PLASMA GLUCOSE: 128 mg/dL

## 2014-11-06 MED ORDER — ROSUVASTATIN CALCIUM 10 MG PO TABS
20.0000 mg | ORAL_TABLET | Freq: Every day | ORAL | Status: DC
  Administered 2014-11-06: 20 mg via ORAL
  Filled 2014-11-06: qty 2

## 2014-11-06 MED ORDER — TECHNETIUM TC 99M SESTAMIBI GENERIC - CARDIOLITE
30.0000 | Freq: Once | INTRAVENOUS | Status: AC | PRN
  Administered 2014-11-06: 30 via INTRAVENOUS

## 2014-11-06 NOTE — Progress Notes (Signed)
UR completed 

## 2014-11-06 NOTE — Discharge Summary (Signed)
Name: [name] Donovan MRN: [id number] DOB: 11/25/1970 44 y.o. PCP: Quentin Angst, MD  Date of Admission: 11/04/2014 10:12 PM Date of Discharge: 11/07/2014 Attending Physician: Randall Hiss, MD  Discharge Diagnosis: 1. Chest pain Principal Problem:   Chest pain Active Problems:   Accelerated hypertension   Obstructive sleep apnea   Morbid obesity-BMI 50   History of Alcohol dependence with withdrawal   History of recurrent major depression-severe   Pain in the chest  Discharge Medications:   Medication List    TAKE these medications        amLODipine 5 MG tablet  Commonly known as:  NORVASC  Take 5 mg by mouth daily.     aspirin 81 MG EC tablet  Take 1 tablet (81 mg total) by mouth daily.     hydrochlorothiazide 25 MG tablet  Commonly known as:  HYDRODIURIL  Take 25 mg by mouth daily.     HYDROcodone-acetaminophen 5-325 MG per tablet  Commonly known as:  NORCO/VICODIN  Take 1 tablet by mouth every 6 (six) hours as needed for moderate pain or severe pain.     ibuprofen 200 MG tablet  Commonly known as:  ADVIL,MOTRIN  Take 600-800 mg by mouth every 8 (eight) hours as needed for moderate pain.     metoprolol tartrate 25 MG tablet  Commonly known as:  LOPRESSOR  Take 25 mg by mouth 2 (two) times daily.     nitroGLYCERIN 0.4 MG SL tablet  Commonly known as:  NITROSTAT  Place 1 tablet (0.4 mg total) under the tongue every 5 (five) minutes x 3 doses as needed for chest pain.     rosuvastatin 20 MG tablet  Commonly known as:  CRESTOR  Take 1 tablet (20 mg total) by mouth daily at 6 PM.     sertraline 50 MG tablet  Commonly known as:  ZOLOFT  Take 1 tablet (50 mg total) by mouth daily. For depression     traZODone 50 MG tablet  Commonly known as:  DESYREL  Take 1 tablet (50 mg total) by mouth at bedtime. Sleep        Disposition and follow-up:   Mr.Manveer Slager was discharged from [facility] in good condition.  At the  hospital follow up visit please address:  1.  Continued resolution of chest pain symptoms.   Encourage smoking cessation.   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 Patient with previous history of this on Lipitor and has been discharged with a trial of Crestor.   2.  Labs / imaging needed at time of follow-up: basic metabolic panel  3.  Pending labs/ test needing follow-up: none  Follow-up Appointments: Follow-up Information    Follow up with Jeanann Lewandowsky, MD. Go on 11/16/2014.   Specialty:  Internal Medicine   Why:  you have an appointment scheduled for hospital follow up at 12pm on 3/24.   Contact informationRicki Rodriguez [place] [address] [phone]       Discharge Instructions:   Consultations: Treatment Team:  Rounding Lbcardiology, MD  Procedures Performed:  Dg Chest 2 View  11/04/2014   CLINICAL DATA:  44 year old male with left-sided chest pain today. Recent history of left elbow numbness. Shortness of breath.  EXAM: CHEST  2 VIEW  COMPARISON:  Chest x-ray 04/28/2013.  FINDINGS: Lung volumes are normal. No consolidative airspace disease. No pleural effusions. No pneumothorax. No pulmonary nodule or mass noted. Pulmonary vasculature and the cardiomediastinal silhouette are  within normal limits.  IMPRESSION: No radiographic evidence of acute cardiopulmonary disease.   Electronically Signed   By: Trudie Reedaniel  Entrikin M.D.   On: 11/04/2014 19:45   Nm Myocar Multi W/spect W/wall Motion / Ef  11/06/2014   CLINICAL DATA:  Chest pain  EXAM: Lexiscan Myovue  TECHNIQUE: The patient received IV Lexiscan .4mg  over 15 seconds. 33.0 mCi of Technetium 140m Sestamibi injected at 30 seconds. Quantitative SPECT images were obtained in the vertical, horizontal and short axis planes after a 45 minute delay. Rest images were obtained with similar planes and delay using 10.2 mCi of Technetium 6940m Sestamibi.  FINDINGS: ECG: Baseline ECG showed NSR with no ST changes; with  infusion; no ST changes  Symptoms: Baseline HR 66 and BP 132/81; following infusion 71 and 131/74; no chest pain; terminated secondary to protocol.  RAW Data:  Adequate image acquistion.  Quantitiative Gated SPECT EF: Gated EF 46 but visually appeared better; normal wall motion; TID-0.96; EDV-179 ml; ESV-96 ml.  Perfusion Images: The images were reconstructed in the short axis, vertical and horizontal long axes. Normal perfusion with no ischemia or infarction.  IMPRESSION: Normal stress nuclear study with no chest pain, no ST changes and no evidence of ischemia or infarction in any vascular territory; gated EF 46 but visually appeared better; normal wall motion; suggest echo to further quantitate LV function.  Olga MillersBrian Crenshaw   Electronically Signed   By: Olga MillersBrian  Crenshaw   On: 11/06/2014 16:39    2D Echo:   Study Conclusions  - Procedure narrative: Transthoracic echocardiography. The study was technically difficult, as a result of body habitus. - Left ventricle: The cavity size was normal. Wall thickness was normal. Systolic function was normal. The estimated ejection fraction was in the range of 55% to 60%. Wall motion was normal; there were no regional wall motion abnormalities. Doppler parameters are consistent with abnormal left ventricular relaxation (grade 1 diastolic dysfunction). The E/e&' ratio is <8, suggesting normal LV filling pressure. - Aortic valve: Poorly visualized. There was no stenosis. There was no regurgitation. - Aorta: Aortic root dimension: 39 mm (ED). - Aortic root: The aortic root was top normal in size. - Mitral valve: Poorly visualized. There was no significant regurgitation. - Left atrium: The atrium was normal in size. - Right atrium: The atrium was mildly dilated. - Inferior vena cava: The vessel was normal in size. The respirophasic diameter changes were in the normal range (>= 50%), consistent with normal central venous  pressure.  Impressions:  - Technically difficult study. LVEF 55-60%, normal wall motion, diastolic dysfunction, normal LV filling pressure, top normal aortic root, normal LA size, mildly dilated RA, normal IVC.  Admission HPI:  Ledora BottcherChristopher Mosquera is a 44 year old man with history of hypertension, depression, and obesity presenting with chest pain. 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 He says that the pain radiated to his left arm, but he denies nausea, vomiting, or headache. He says the pain was initially 6 out of 10, but decreased to 3 out of 10 while in the waiting room. He currently says he is not having any chest pain. He says he has had mild episodes of chest pain with exertion in the past but not to this extent. He denies any previous history of coronary artery disease, anxiety, or GERD. He says he saw a cardiologist approximately 15 years ago in [place]. He reportedly had stress testing which was normal,  and he has not seen a cardiologist since then. He was previously seen in the [facility] and [facility], but he does not follow regularly with a primary care doctor. He says that his brother and father both have a rare heart disease, but he does not know the name of the condition. His brother died from the heart condition. He says that he was previously tested and found not to have the disease.   Hospital Course by problem list: Principal Problem:   Chest pain Active Problems:   Accelerated hypertension   Obstructive sleep apnea   Morbid obesity-BMI 50   History of Alcohol dependence with withdrawal   History of recurrent major depression-severe   Pain in the chest   1. Chest pain: Pt was given aspirin and nitroglycerin in the ED. Patient's chest pain improved quickly upon initial evaluation. He had no recurrent episodes of chest pain. Initial EKG showed nonspecific ST  changes, troponins were negative x3. Myovue Lexiscan showed a normal stress nuclear test with no chest pain and no ST changes. Transthoracic echocardiogram unremarkable for wall motion abnormalities or valvular disease.  Lipid panel showed LDL of 130 and patient was started on Crestor  given past adverse reaction to Lipitor. Hemoglobin A1C was 6.1   2. Hypertension: Blood pressure was elevated to 180/101 in the ED but returned to normal during hospitalization. We continued home HCTZ 25 mg daily, amlodipine 5 mg daily, and metoprolol 25 mg twice a day.  3. Acute kidney injury: Creatinine was 1.5 on admission. Baseline was unclear: it was elevated at 1.38 earlier this year, but was 1.12 in 2014. We monitored creatinine throughout hospitalization with creatinine of 1.27 on discharge. May consider further follow up as an outpatient.   4. Depression: Patient was previously hospitalized in September 2015 with alcohol intoxication and suicidal ideation and was on Zoloft and trazodone for 1 week while he was inpatient. Throughout hospitalization he stated that his mood was good and denied any SI and did not desire any pharmacological treatment or counseling.   Discharge Vitals:   BP 117/74 mmHg  Pulse 63  Temp(Src) 98 F (36.7 C) (Oral)  Resp 16  Ht 6' (1.829 m)  Wt 166.379 kg (366 lb 12.8 oz)  BMI 49.74 kg/m2  SpO2 97%  Discharge Labs:  No results found for this or any previous visit (from the past 24 hour(s)).  Signed: Cardell Peach, Med Student 11/07/2014, 10:36 AM    Services Ordered on Discharge: none Equipment Ordered on Discharge: none

## 2014-11-06 NOTE — Progress Notes (Signed)
  Date: 11/06/2014  Patient name: [name] Donovan  Medical record number: [id number]  Date of birth: [birth year]   This patient's plan of care was discussed with the house staff. Please see their note for complete details. I concur with their findings.  Patient was feeling better today and without chest pressure whatsoever.  He is ambulating comfortably about his room his girlfriend was visiting him.  Exam:  General alert and oriented 3  Cardiovascular exam distant heart sounds but regular rate and rhythm no murmurs Rubs heard  Pulmonary clear to auscultation bilaterally  Extremities plus edema  Neurological exam nonfocal.  Assessment and plan  #1 chest pain: Patient has had phase II of his stress Myoview done today results are pending  --Continue aspirin beta blocker, challenge with Crestor given his history of what sounds like a myositis potentially to Lipitor  Hypertension: Continue beta blocker thiazide diuretic and amlodipine  Depression: Stable.  History of tobacco use: Counseled again to stop completely says he smokes partially 3 cigarettes a day.  History of alcoholism,: Currently [facility] for [facility]   Randall Hissornelius N Van Dam, MD 11/06/2014, 11:05 AM

## 2014-11-06 NOTE — Progress Notes (Signed)
Subjective: No CP.  Woke up with some pain in his upper back which is aggravated with movement.  Objective: Vital signs in last 24 hours: Temp:  [97.9 F (36.6 C)-98.9 F (37.2 C)] 98.6 F (37 C) (03/14 0524) Pulse Rate:  [58-73] 58 (03/14 0524) Resp:  [20-24] 20 (03/14 0524) BP: (131-151)/(66-91) 131/80 mmHg (03/14 0524) SpO2:  [95 %-96 %] 95 % (03/14 0524) Weight:  [365 lb 3.2 oz (165.654 kg)] 365 lb 3.2 oz (165.654 kg) (03/14 0524) Last BM Date: 11/05/14  Intake/Output from previous day: 03/13 0701 - 03/14 0700 In: 360 [P.O.:360] Out: -  Intake/Output this shift:    Medications Current Facility-Administered Medications  Medication Dose Route Frequency Provider Last Rate Last Dose  . acetaminophen (TYLENOL) tablet 650 mg  650 mg Oral Q6H PRN Randall Hiss, MD   650 mg at 11/05/14 2145  . amLODipine (NORVASC) tablet 5 mg  5 mg Oral Daily Annett Gula, MD   5 mg at 11/06/14 0939  . aspirin EC tablet 81 mg  81 mg Oral Daily Annett Gula, MD   81 mg at 11/06/14 4098  . enoxaparin (LOVENOX) injection 80 mg  80 mg Subcutaneous Q24H Randall Hiss, MD   80 mg at 11/06/14 0940  . hydrochlorothiazide (HYDRODIURIL) tablet 25 mg  25 mg Oral Daily Annett Gula, MD   25 mg at 11/06/14 0939  . metoprolol tartrate (LOPRESSOR) tablet 25 mg  25 mg Oral BID Annett Gula, MD   25 mg at 11/06/14 0939  . nitroGLYCERIN (NITROSTAT) SL tablet 0.4 mg  0.4 mg Sublingual Q5 Min x 3 PRN Roxy Horseman, PA-C   0.4 mg at 11/04/14 2257  . sodium chloride 0.9 % injection 3 mL  3 mL Intravenous Q12H Annett Gula, MD   3 mL at 11/06/14 0940    PE: General appearance: alert, cooperative and no distress Lungs: clear to auscultation bilaterally Heart: regular rate and rhythm, S1, S2 normal, no murmur, click, rub or gallop Extremities: No LEE Pulses: 2+ and symmetric Skin: Warm and dry Neurologic: Grossly normal  Lab Results:   Recent Labs  11/04/14 1849  WBC 8.7  HGB 13.8    HCT 42.4  PLT 212   BMET  Recent Labs  11/04/14 1849 11/05/14 0449 11/06/14 0437  NA 140 138 137  K 3.7 3.6 4.1  CL 106 104 102  CO2 GLUCOSE 125* 107* 96  BUN CREATININE 1.50* 1.24 1.27  CALCIUM 9.1 9.0 8.9   PT/INR No results for input(s): LABPROT, INR in the last 72 hours. Cholesterol  Recent Labs  11/05/14 1250  CHOL 200   Lipid Panel     Component Value Date/Time   CHOL 200 11/05/2014 1250   TRIG 181* 11/05/2014 1250   HDL 34* 11/05/2014 1250   CHOLHDL 5.9 11/05/2014 1250   VLDL 36 11/05/2014 1250   LDLCALC 130* 11/05/2014 1250    Assessment/Plan  44 y.o. male with a past medical history significant for morbid obesity, HTN, past history of ETOH dependence with admissions for withdrawl, depression with suicidal ideations requiring hospitalization in the past, and homelessness- currently living at [facility].     Chest pain Ruled out for MI.  Day two of the nuke study today.  Results pending.     Accelerated hypertension  BP stable. Amlodipine 5, HCTZ 25, lopressor 25BID.     Obstructive sleep apnea  Morbid obesity-BMI 50  We discussed diet and exercise at length.    History of Alcohol dependence with withdrawal   History of recurrent major depression-severe   HLD: Primary team switching statin.  History of myalgias         HAGER, BRYAN PA-C 11/06/2014 9:53 AM Patient feels well this morning.  He has had his second phase of his Myoview.  The results are pending.  He denies any chest discomfort today.  On examination his lungs are clear.  The heart reveals no gallop.  Extremities do not reveal any pedal edema.  No phlebitis. Plan will be for possible discharge later today if Myoview study is negative. Agree with assessment and plan as noted above

## 2014-11-06 NOTE — Progress Notes (Signed)
Internal Medicine Daily Progress Note  SUBJECTIVE/24 HOUR EVENTS: Mr. Swett is feeling well this morning, he states that his chest pressure has resolved.   OBJECTIVE: Filed Vitals:   11/05/14 1212 11/05/14 1719 11/05/14 2141 11/06/14 0524  BP: 151/86 148/88 147/91 131/80  Pulse: 73 67 66 58  Temp:  97.9 F (36.6 C) 98.9 F (37.2 C) 98.6 F (37 C)  TempSrc:  Oral Oral Oral  Resp:   20 20  Height:      Weight:    165.654 kg (365 lb 3.2 oz)  SpO2:  96% 95% 95%    Intake/Output Summary (Last 24 hours) at 11/06/14 1044 Last data filed at 11/05/14 2200  Gross per 24 hour  Intake    360 ml  Output      0 ml  Net    360 ml    Intake/Output last 3 shifts: I/O last 3 completed shifts: In: 363 [P.O.:360; I.V.:3] Out: -   Medications: Scheduled Meds: . amLODipine  5 mg Oral Daily  . aspirin EC  81 mg Oral Daily  . enoxaparin (LOVENOX) injection  80 mg Subcutaneous Q24H  . hydrochlorothiazide  25 mg Oral Daily  . metoprolol tartrate  25 mg Oral BID  . rosuvastatin  20 mg Oral q1800  . sodium chloride  3 mL Intravenous Q12H   Continuous Infusions:  PRN Meds:.acetaminophen, nitroGLYCERIN  Physical Exam:   Labs: Recent Labs     11/04/14  1849  11/05/14  0449  11/06/14  0437  HGB  13.8   --    --   HCT  42.4   --    --   PLT  212   --    --   NA  140  138  137  K  3.7  3.6  4.1  CL  106  104  102  CO2  BUN  CREATININE  1.50*  1.24  1.27  CALCIUM  9.1  9.0  8.9    Vitals, labs, and images reviewed.  Principal Problem:   Chest pain Active Problems:   Accelerated hypertension   Obstructive sleep apnea   Morbid obesity-BMI 50   History of Alcohol dependence with withdrawal   History of recurrent major depression-severe   Pain in the chest  ASSESSMENT AND PLAN:  Mr. Enfield is a pleasant 44 yo male with a history of HTN, depression, obesity and EtOH dependence with resolved chest pain but persistent chest pressure.  #Chest pain Heart  score of 4 due to suspicious chest pain, nonspecific ST changes, and risk factors for heart disease. Negative troponin x 3 and EKG. Chest x-ray unremarkable. Received aspirin 324 mg in the ED. Patient had some concerns regarding restarting Lipitor given his averse reaction (possibly myositis) and we communicated the benefits of statins and agreed to start Crestor. Spoke with patient's sister this am, it appears that there is a strong family history of dialated cardiomyopathy with onset prior to mid-30s. -Day 2 day of Lexiscan completed, results  -Echocardiogram given FH of cardiomyopathy and hx of EtOH dependence  --Increase aspirin 325 mg daily. --LDL 130, start Crestor 20 mg daily --Encourage smoking cessation (patient currently smokes 2 cigarettes per day.)   f/u hemoglobin A1c  #Hypertension Blood pressure elevated to 180/101 in the ER. Not taking lisinopril due to facial swelling. -Continue home HCTZ 25 mg daily, amlodipine 5 mg daily, and metoprolol 25 mg twice  a day.  #Acute kidney injury Creatinine today 1.27, down from 1.5 on admission. It was elevated at 1.38 earlier this year, but was 1.12 in 2014. It is unclear what his current baseline is. --Encourage PO hydration, pt normally drinks 4 small cups of liquid per day  #Depression Previously hospitalized in September 2015 with alcohol intoxication and suicidal ideation. Denies any dysphoric mood currently. Was on Zoloft and trazodone for 1 week while he was inpatient. -No treatment desired currently.  #DVT prophylaxis: In morbidly-obese patients (BMI =40 kg/m2), increasing the prophylactic dose may be appropriate. Pt has a BMI of 50. Pharmacy consulted and adjusted dose to 0.5mg /kg SQ q24h -Lovenox 80 mg sub Q BID  The patient does have a current PCP (Quentin Angstlugbemiga E Jegede, MD), therefore will not require OPC follow-up after discharge. He will need a Radiation protection practitionerfinancial adviser appointment in clinic as well as transportation help seeing as  his transportation is limited [facility].

## 2014-11-06 NOTE — Care Management Note (Signed)
    Page 1 of 1   11/06/2014     2:25:31 PM CARE MANAGEMENT NOTE 11/06/2014  Patient:  [name] Donovan,[name] Donovan   Account Number:  [id number]  Date Initiated:  11/06/2014  Documentation initiated by:  GRAVES-BIGELOW,Nilan Iddings  Subjective/Objective Assessment:   Pt admitted for chest pain- pt is [facility] and he gets medications via [facility]. CSW did speak with pt in regards to transportation needs.     Action/Plan:   No further needs from CM at this time.   Anticipated DC Date:  11/06/2014   Anticipated DC Plan:  HOME/SELF CARE      DC Planning Services  CM consult      Choice offered to / List presented to:             Status of service:  Completed, signed off Medicare Important Message given?  NO (If response is "NO", the following Medicare IM given date fields will be blank) Date Medicare IM given:   Medicare IM given by:   Date Additional Medicare IM given:   Additional Medicare IM given by:    Discharge Disposition:  HOME/SELF CARE  Per UR Regulation:  Reviewed for med. necessity/level of care/duration of stay  If discussed at Long Length of Stay Meetings, dates discussed:    Comments:

## 2014-11-06 NOTE — Progress Notes (Signed)
CSW (Clinical Child psychotherapistocial Worker) notified pt admitted from [facility] and may need homeless resources. CSW visited pt room and pt confirmed he was admitted from [facility]. Pt also confirms he has been there for three months and prior to that he was homeless. Pt reports there is a potential he has only one month left at [facility] but it depends on how he is doing. He has no current plan for where he will go after dc from there. Pt declined shelter list and CSW unable to provide with any other resources as pt reports he has no income.  Pt did confirm [facility] since admission and they will provide transport at dc. CSW spoke with pt nurse and notified. CSW requested pt be notified as soon as possible about dc so he can make appropriate dc arrangements. At this time, pt has no further hospital social work needs. CSW signing off.  [name] Donovan, LCSWA [phone]

## 2014-11-06 NOTE — Progress Notes (Signed)
Subjective:   Patient states that his chest pain is resolved this morning with no acute complaints. He was able to complete the second session of his nuclear medicine stress test this morning with no major issues. Patient notes some concern regarding starting a statin but is willing to try a different class of statin this time.   Objective: Vital signs in last 24 hours: Filed Vitals:   11/05/14 1212 11/05/14 1719 11/05/14 2141 11/06/14 0524  BP: 151/86 148/88 147/91 131/80  Pulse: 73 67 66 58  Temp:  97.9 F (36.6 C) 98.9 F (37.2 C) 98.6 F (37 C)  TempSrc:  Oral Oral Oral  Resp:   20 20  Height:      Weight:    365 lb 3.2 oz (165.654 kg)  SpO2:  96% 95% 95%   Weight change: -10 lb 14.4 oz (-4.944 kg)  Intake/Output Summary (Last 24 hours) at 11/06/14 0708 Last data filed at 11/05/14 2200  Gross per 24 hour  Intake    360 ml  Output      0 ml  Net    360 ml    General: resting in bed, in no acute distress, obese HEENT: PERRL, EOMI, no scleral icterus Cardiac: RRR, no rubs, murmurs or gallops Pulm: clear to auscultation bilaterally, moving normal volumes of air Abd: soft, nontender, nondistended, BS present Ext: warm and well perfused, no pedal edema Neuro: alert and oriented X3, cranial nerves II-XII grossly intact Skin: no rashes or lesions noted Psych: appropriate affect  Lab Results: Basic Metabolic Panel:  Recent Labs Lab 11/05/14 0449 11/06/14 0437  NA 138 137  K 3.6 4.1  CL 104 102  CO2 28 31  GLUCOSE 107* 96  BUN 15 18  CREATININE 1.24 1.27  CALCIUM 9.0 8.9   Liver Function Tests: No results for input(s): AST, ALT, ALKPHOS, BILITOT, PROT, ALBUMIN in the last 168 hours. No results for input(s): LIPASE, AMYLASE in the last 168 hours. No results for input(s): AMMONIA in the last 168 hours. CBC:  Recent Labs Lab 11/04/14 1849  WBC 8.7  HGB 13.8  HCT 42.4  MCV 73.9*  PLT 212   Cardiac Enzymes:  Recent Labs Lab 11/05/14 0006  11/05/14 0449 11/05/14 1250  TROPONINI <0.03 <0.03 <0.03   BNP: No results for input(s): PROBNP in the last 168 hours. D-Dimer: No results for input(s): DDIMER in the last 168 hours. CBG: No results for input(s): GLUCAP in the last 168 hours. Hemoglobin A1C: No results for input(s): HGBA1C in the last 168 hours. Fasting Lipid Panel:  Recent Labs Lab 11/05/14 1250  CHOL 200  HDL 34*  LDLCALC 130*  TRIG 181*  CHOLHDL 5.9   Thyroid Function Tests: No results for input(s): TSH, T4TOTAL, FREET4, T3FREE, THYROIDAB in the last 168 hours. Coagulation: No results for input(s): LABPROT, INR in the last 168 hours. Anemia Panel: No results for input(s): VITAMINB12, FOLATE, FERRITIN, TIBC, IRON, RETICCTPCT in the last 168 hours. Urine Drug Screen: Drugs of Abuse     Component Value Date/Time   LABOPIA NONE DETECTED 11/05/2014 0030   COCAINSCRNUR NONE DETECTED 11/05/2014 0030   LABBENZ NONE DETECTED 11/05/2014 0030   AMPHETMU NONE DETECTED 11/05/2014 0030   THCU NONE DETECTED 11/05/2014 0030   LABBARB NONE DETECTED 11/05/2014 0030    Alcohol Level:  Recent Labs Lab 11/05/14 0006  ETH <5   Urinalysis: No results for input(s): COLORURINE, LABSPEC, PHURINE, GLUCOSEU, HGBUR, BILIRUBINUR, KETONESUR, PROTEINUR, UROBILINOGEN, NITRITE, LEUKOCYTESUR in the last  168 hours.  Invalid input(s): APPERANCEUR  Micro Results: No results found for this or any previous visit (from the past 240 hour(s)). Studies/Results: Dg Chest 2 View  11/04/2014   CLINICAL DATA:  44 year old male with left-sided chest pain today. Recent history of left elbow numbness. Shortness of breath.  EXAM: CHEST  2 VIEW  COMPARISON:  Chest x-ray 04/28/2013.  FINDINGS: Lung volumes are normal. No consolidative airspace disease. No pleural effusions. No pneumothorax. No pulmonary nodule or mass noted. Pulmonary vasculature and the cardiomediastinal silhouette are within normal limits.  IMPRESSION: No radiographic  evidence of acute cardiopulmonary disease.   Electronically Signed   By: Trudie Reed M.D.   On: 11/04/2014 19:45   Medications: I have reviewed the patient's current medications. Scheduled Meds: . acetaminophen      . amLODipine  5 mg Oral Daily  . aspirin EC  81 mg Oral Daily  . enoxaparin (LOVENOX) injection  80 mg Subcutaneous Q24H  . hydrochlorothiazide  25 mg Oral Daily  . metoprolol tartrate  25 mg Oral BID  . sodium chloride  3 mL Intravenous Q12H   Continuous Infusions:  PRN Meds:.acetaminophen, nitroGLYCERIN Assessment/Plan: Principal Problem:   Chest pain Active Problems:   Accelerated hypertension   Obstructive sleep apnea   Morbid obesity-BMI 50   History of Alcohol dependence with withdrawal   History of recurrent major depression-severe   Pain in the chest  Chest pain: Patient's chest pressure has resolved since initial presentation. Exertional chest pain with a heart score of 4. EKG has been unremarkable for evidence of ischemic change and troponins have been negative x3. Chest x-ray unremarkable. Results from 2 day Myoview protocol pending. Sister reporting a family history of dilated cardiomyopathy with several deaths in the family in the 75s. Patient having evaluated with an echocardiogram that was negative in the past. -Continue aspirin 81 mg daily. -Continue sublingual nitroglycerin as needed. -Acetaminophen 650 mg every 6 hours when necessary. -Repeat echocardiogram  Hyperlipidemia: Patient with an LDL of 130 and total cholesterol 200. Patient has a previous reactions to Lipitor (muscle aches). -Will trial patient on rosuvastatin 20 mg daily.  Hypertension: Patient has remained normotensive today. Patient is not taking lisinopril due to facial swelling. -Continue home HCTZ 25 mg daily, amlodipine 5 mg daily, and metoprolol 25 mg twice a day.  Acute kidney injury: Resolved. Patient presenting with an elevated creatinine of 1.50 up from a baseline of  1.1.  Depression: No acute issues during this hospitalization. Previously hospitalized in September 2015 with alcohol intoxication and suicidal ideation. Denies any dysphoric mood currently. Was previously on Zoloft and trazodone, but he says he does not take them currently. -No treatment currently.  DVT prophylaxis -Lovenox.  Dispo: Disposition is deferred at this time, awaiting improvement of current medical problems. Anticipated discharge in approximately 0-2 day(s).   The patient does have a current PCP (Quentin Angst, MD), therefore will not require OPC follow-up after discharge.   The patient does not have transportation limitations that hinder transportation to clinic appointments.     Services Needed at time of discharge: Y = Yes, Blank = No PT:   OT:   RN:   Equipment:   Other:    Harold Barban, MD 11/06/2014, 7:08 AM

## 2014-11-07 DIAGNOSIS — R079 Chest pain, unspecified: Secondary | ICD-10-CM

## 2014-11-07 MED ORDER — NITROGLYCERIN 0.4 MG SL SUBL: 0.4000 mg | SUBLINGUAL_TABLET | SUBLINGUAL | Status: DC | PRN

## 2014-11-07 MED ORDER — ASPIRIN 81 MG PO TBEC: 81.0000 mg | DELAYED_RELEASE_TABLET | Freq: Every day | ORAL | Status: DC

## 2014-11-07 MED ORDER — ROSUVASTATIN CALCIUM 20 MG PO TABS: 20.0000 mg | ORAL_TABLET | Freq: Every day | ORAL | Status: DC

## 2014-11-07 NOTE — Progress Notes (Signed)
Subjective: No complaints  Objective: Vital signs in last 24 hours: Temp:  [98 F (36.7 C)-98.6 F (37 C)] 98 F (36.7 C) (03/15 0423) Pulse Rate:  [62-63] 63 (03/14 2000) Resp:  [16] 16 (03/14 1326) BP: (117-142)/(74-86) 117/74 mmHg (03/15 0423) SpO2:  [97 %-99 %] 97 % (03/15 0423) Weight:  [366 lb 12.8 oz (166.379 kg)] 366 lb 12.8 oz (166.379 kg) (03/15 0500) Last BM Date: 11/07/14  Intake/Output from previous day: 03/14 0701 - 03/15 0700 In: 960 [P.O.:960] Out: -  Intake/Output this shift:    Medications Current Facility-Administered Medications  Medication Dose Route Frequency Provider Last Rate Last Dose  . acetaminophen (TYLENOL) tablet 650 mg  650 mg Oral Q6H PRN Randall Hiss, MD   650 mg at 11/06/14 2201  . amLODipine (NORVASC) tablet 5 mg  5 mg Oral Daily Annett Gula, MD   5 mg at 11/07/14 0745  . aspirin EC tablet 81 mg  81 mg Oral Daily Annett Gula, MD   81 mg at 11/07/14 0745  . enoxaparin (LOVENOX) injection 80 mg  80 mg Subcutaneous Q24H Randall Hiss, MD   80 mg at 11/07/14 0744  . hydrochlorothiazide (HYDRODIURIL) tablet 25 mg  25 mg Oral Daily Annett Gula, MD   25 mg at 11/07/14 0745  . metoprolol tartrate (LOPRESSOR) tablet 25 mg  25 mg Oral BID Annett Gula, MD   25 mg at 11/07/14 0745  . nitroGLYCERIN (NITROSTAT) SL tablet 0.4 mg  0.4 mg Sublingual Q5 Min x 3 PRN Roxy Horseman, PA-C   0.4 mg at 11/04/14 2257  . rosuvastatin (CRESTOR) tablet 20 mg  20 mg Oral q1800 Harold Barban, MD   20 mg at 11/06/14 1715  . sodium chloride 0.9 % injection 3 mL  3 mL Intravenous Q12H Annett Gula, MD   3 mL at 11/06/14 2203    PE: General appearance: alert, cooperative and no distress Lungs: clear to auscultation bilaterally Heart: regular rate and rhythm, S1, S2 normal, no murmur, click, rub or gallop Extremities: No LEE Pulses: 2+ and symmetric Skin: Warm and dry Neurologic: Grossly normal  Lab Results:   Recent Labs   11/04/14 1849  WBC 8.7  HGB 13.8  HCT 42.4  PLT 212   BMET  Recent Labs  11/04/14 1849 11/05/14 0449 11/06/14 0437  NA 140 138 137  K 3.7 3.6 4.1  CL 106 104 102  CO2 GLUCOSE 125* 107* 96  BUN CREATININE 1.50* 1.24 1.27  CALCIUM 9.1 9.0 8.9   PT/INR No results for input(s): LABPROT, INR in the last 72 hours. Cholesterol  Recent Labs  11/05/14 1250  CHOL 200   Lipid Panel     Component Value Date/Time   CHOL 200 11/05/2014 1250   TRIG 181* 11/05/2014 1250   HDL 34* 11/05/2014 1250   CHOLHDL 5.9 11/05/2014 1250   VLDL 36 11/05/2014 1250   LDLCALC 130* 11/05/2014 1250     Assessment/Plan 44 y.o. male with a past medical history significant for morbid obesity, HTN, past history of ETOH dependence with admissions for withdrawl, depression with suicidal ideations requiring hospitalization in the past, and homelessness- currently living at [facility].    Chest pain Ruled out for MI. Nuke study negative for ischemia.   Accelerated hypertension BP stable. Amlodipine 5, HCTZ 25, lopressor 25BID.   Obstructive sleep apnea  Morbid obesity-BMI 50 We discussed diet  and exercise at length.   History of Alcohol dependence with withdrawal  History of recurrent major depression-severe  HLD: Primary team switching statin. History of myalgias      He had several beats(~7) of SVT this morning.  On lopressor.   He has an echo pending.  Ok for DC from cardiology standpoint.   Wilburt FinlayHAGER, BRYAN PA-C 11/07/2014 9:09 AM The echo has been done, results are pending.  He is stable to be discharged from cardiac standpoint.  Recommendations as above.

## 2014-11-07 NOTE — Progress Notes (Signed)
UR completed 

## 2014-11-07 NOTE — Progress Notes (Signed)
*  PRELIMINARY RESULTS* Echocardiogram 2D Echocardiogram has been performed.  Jeryl ColumbiaLLIOTT, Mikle Sternberg 11/07/2014, 11:38 AM

## 2014-11-07 NOTE — Progress Notes (Signed)
Subjective:  Patient continues to deny any recurrent chest pain. He has been updated on the normal results of his stress test. Patient otherwise denying any complaints this morning.   Objective: Vital signs in last 24 hours: Filed Vitals:   11/06/14 1326 11/06/14 2000 11/07/14 0423 11/07/14 0500  BP: 142/86 141/78 117/74   Pulse: 62 63    Temp: 98.1 F (36.7 C) 98.6 F (37 C) 98 F (36.7 C)   TempSrc: Oral Oral Oral   Resp: 16     Height:      Weight:    366 lb 12.8 oz (166.379 kg)  SpO2: 99% 98% 97%    Weight change: 1 lb 9.6 oz (0.726 kg)  Intake/Output Summary (Last 24 hours) at 11/07/14 0805 Last data filed at 11/06/14 2030  Gross per 24 hour  Intake    960 ml  Output      0 ml  Net    960 ml    General: resting in bed, in no acute distress, obese HEENT: PERRL, EOMI, no scleral icterus Cardiac: RRR, no rubs, murmurs or gallops Pulm: clear to auscultation bilaterally, moving normal volumes of air Abd: soft, nontender, nondistended, BS present Ext: warm and well perfused, no pedal edema Neuro: alert and oriented X3, cranial nerves II-XII grossly intact Skin: no rashes or lesions noted Psych: appropriate affect  Lab Results: Basic Metabolic Panel:  Recent Labs Lab 11/05/14 0449 11/06/14 0437  NA 138 137  K 3.6 4.1  CL 104 102  CO2 28 31  GLUCOSE 107* 96  BUN 15 18  CREATININE 1.24 1.27  CALCIUM 9.0 8.9   Liver Function Tests: No results for input(s): AST, ALT, ALKPHOS, BILITOT, PROT, ALBUMIN in the last 168 hours. No results for input(s): LIPASE, AMYLASE in the last 168 hours. No results for input(s): AMMONIA in the last 168 hours. CBC:  Recent Labs Lab 11/04/14 1849  WBC 8.7  HGB 13.8  HCT 42.4  MCV 73.9*  PLT 212   Cardiac Enzymes:  Recent Labs Lab 11/05/14 0006 11/05/14 0449 11/05/14 1250  TROPONINI <0.03 <0.03 <0.03   BNP: No results for input(s): PROBNP in the last 168 hours. D-Dimer: No results for input(s): DDIMER in the  last 168 hours. CBG: No results for input(s): GLUCAP in the last 168 hours. Hemoglobin A1C:  Recent Labs Lab 11/05/14 1250  HGBA1C 6.1*   Fasting Lipid Panel:  Recent Labs Lab 11/05/14 1250  CHOL 200  HDL 34*  LDLCALC 130*  TRIG 181*  CHOLHDL 5.9   Thyroid Function Tests: No results for input(s): TSH, T4TOTAL, FREET4, T3FREE, THYROIDAB in the last 168 hours. Coagulation: No results for input(s): LABPROT, INR in the last 168 hours. Anemia Panel: No results for input(s): VITAMINB12, FOLATE, FERRITIN, TIBC, IRON, RETICCTPCT in the last 168 hours. Urine Drug Screen: Drugs of Abuse     Component Value Date/Time   LABOPIA NONE DETECTED 11/05/2014 0030   COCAINSCRNUR NONE DETECTED 11/05/2014 0030   LABBENZ NONE DETECTED 11/05/2014 0030   AMPHETMU NONE DETECTED 11/05/2014 0030   THCU NONE DETECTED 11/05/2014 0030   LABBARB NONE DETECTED 11/05/2014 0030    Alcohol Level:  Recent Labs Lab 11/05/14 0006  ETH <5   Urinalysis: No results for input(s): COLORURINE, LABSPEC, PHURINE, GLUCOSEU, HGBUR, BILIRUBINUR, KETONESUR, PROTEINUR, UROBILINOGEN, NITRITE, LEUKOCYTESUR in the last 168 hours.  Invalid input(s): APPERANCEUR  Micro Results: No results found for this or any previous visit (from the past 240 hour(s)). Studies/Results: Nm Myocar Multi  W/spect W/wall Motion / Ef  11/06/2014   CLINICAL DATA:  Chest pain  EXAM: Lexiscan Myovue  TECHNIQUE: The patient received IV Lexiscan .  over 15 seconds. 33.0 mCi of Technetium 51m Sestamibi injected at 30 seconds. Quantitative SPECT images were obtained in the vertical, horizontal and short axis planes after a 45 minute delay. Rest images were obtained with similar planes and delay using 10.2 mCi of Technetium 27m Sestamibi.  FINDINGS: ECG: Baseline ECG showed NSR with no ST changes; with infusion; no ST changes  Symptoms: Baseline HR 66 and BP 132/81; following infusion 71 and 131/74; no chest pain; terminated secondary to  protocol.  RAW Data:  Adequate image acquistion.  Quantitiative Gated SPECT EF: Gated EF 46 but visually appeared better; normal wall motion; TID-0.96; EDV-179 ml; ESV-96 ml.  Perfusion Images: The images were reconstructed in the short axis, vertical and horizontal long axes. Normal perfusion with no ischemia or infarction.  IMPRESSION: Normal stress nuclear study with no chest pain, no ST changes and no evidence of ischemia or infarction in any vascular territory; gated EF 46 but visually appeared better; normal wall motion; suggest echo to further quantitate LV function.  [name] Donovan   Electronically Signed   By: [name] Donovan   On: 11/06/2014 16:39   Medications: I have reviewed the patient's current medications. Scheduled Meds: . amLODipine  5 mg Oral Daily  . aspirin EC  81 mg Oral Daily  . enoxaparin (LOVENOX) injection  80 mg Subcutaneous Q24H  . hydrochlorothiazide  25 mg Oral Daily  . metoprolol tartrate  25 mg Oral BID  . rosuvastatin  20 mg Oral q1800  . sodium chloride  3 mL Intravenous Q12H   Continuous Infusions:  PRN Meds:.acetaminophen, nitroGLYCERIN Assessment/Plan: Principal Problem:   Chest pain Active Problems:   Accelerated hypertension   Obstructive sleep apnea   Morbid obesity-BMI 50   History of Alcohol dependence with withdrawal   History of recurrent major depression-severe   Pain in the chest  Chest pain: Normal Myoview stress nuclear study. Patient's chest pressure has resolved since initial presentation. Sister reporting a family history of dilated cardiomyopathy with several deaths in the family in the 69s. Patient has been evaluated with a prior echocardiogram that was negative, though this was remote.  -Continue aspirin 81 mg daily. -Acetaminophen 650 mg every 6 hours when necessary. -Repeat echocardiogram pending  Hyperlipidemia: Patient with an LDL of 130 and total cholesterol 200. Patient has a previous reactions to Lipitor (muscle aches). -Will  trial patient on rosuvastatin 20 mg daily.  Hypertension: Patient has remained normotensive today. Patient is not taking lisinopril due to facial swelling. -Continue home HCTZ 25 mg daily, amlodipine 5 mg daily, and metoprolol 25 mg twice a day.  Acute kidney injury: Resolved. Patient presenting with an elevated creatinine of 1.50 up from a baseline of 1.1.  Depression: No acute issues during this hospitalization. Previously hospitalized in September 2015 with alcohol intoxication and suicidal ideation. Denies any dysphoric mood currently. Was previously on Zoloft and trazodone, but he says he does not take them currently. -No treatment currently.  DVT prophylaxis -Lovenox.  Dispo: Disposition is deferred at this time, awaiting improvement of current medical problems. Anticipated discharge in approximately 0 day(s).  -Pending completion of echocardiogram  The patient does have a current PCP (Quentin Angst, MD), therefore will not require OPC follow-up after discharge.   The patient does not have transportation limitations that hinder transportation to clinic appointments.  Services Needed at time of discharge: Y = Yes, Blank = No PT:   OT:   RN:   Equipment:   Other:    Harold Barban, MD 11/07/2014, 8:05 AM

## 2014-11-07 NOTE — Discharge Instructions (Signed)

## 2014-11-07 NOTE — Progress Notes (Signed)
Internal Medicine Daily Progress Note  SUBJECTIVE/24 HOUR EVENTS: [name] Donovan is feeling well today. He states that he had no chest pain or pressure overnight.   OBJECTIVE: Filed Vitals:   11/06/14 1326 11/06/14 2000 11/07/14 0423 11/07/14 0500  BP: 142/86 141/78 117/74   Pulse: 62 63    Temp: 98.1 F (36.7 C) 98.6 F (37 C) 98 F (36.7 C)   TempSrc: Oral Oral Oral   Resp: 16     Height:      Weight:    166.379 kg (366 lb 12.8 oz)  SpO2: 99% 98% 97%     Intake/Output Summary (Last 24 hours) at 11/07/14 1044 Last data filed at 11/06/14 2030  Gross per 24 hour  Intake    960 ml  Output      0 ml  Net    960 ml    Intake/Output last 3 shifts: I/O last 3 completed shifts: In: 1320 [P.O.:1320] Out: -   Medications: Scheduled Meds: . amLODipine  5 mg Oral Daily  . aspirin EC  81 mg Oral Daily  . enoxaparin (LOVENOX) injection  80 mg Subcutaneous Q24H  . hydrochlorothiazide  25 mg Oral Daily  . metoprolol tartrate  25 mg Oral BID  . rosuvastatin  20 mg Oral q1800  . sodium chloride  3 mL Intravenous Q12H   Continuous Infusions:  PRN Meds:.acetaminophen, nitroGLYCERIN  Physical Exam: Gen: well appearing in NAD CV: RRR, no r/m/g Pulm: CTAB, no wheezes Abd: soft, NTND normoactive bowel sounds Psych: euthymic, cooperative, with goal-directed and linear thought process and normal speech rate and volume.  Labs: Recent Labs     11/04/14  1849  11/05/14  0449  11/06/14  0437  HGB  13.8   --    --   HCT  42.4   --    --   PLT  212   --    --   NA  140  138  137  K  3.7  3.6  4.1  CL  106  104  102  CO2  BUN  CREATININE  1.50*  1.24  1.27  CALCIUM  9.1  9.0  8.9    Vitals, labs, and images reviewed.  Principal Problem:   Chest pain Active Problems:   Accelerated hypertension   Obstructive sleep apnea   Morbid obesity-BMI 50   History of Alcohol dependence with withdrawal   History of recurrent major depression-severe   Pain in the  chest  ASSESSMENT AND PLAN:  [name] Donovan is a pleasant 44 yo male with a history of HTN, depression, obesity and EtOH dependence with resolved chest pain and no evidence of ischemia on imaging.   #Chest pain Negative troponin x 3 and EKG, normal Myoview stress nuclear study, chest x-ray unremarkable. LDL 130, HbA1C 6.1. Patient had an extensive family history of dilated cardiomyopathy but has had a negative echocardiogram in the past. Patient is to be discharged today if his echocardiogram is negative.  -Echocardiogram given FH of cardiomyopathy and hx of EtOH dependence.  --Aspirin  daily. --Rosuvastatin 20 mg daily --Counseled on smoking cessation (patient currently smokes 2 cigarettes per day,) healthy diet with increased vegetable and decreased carbohydrate and fried food intake, and exercise.   #Hypertension Blood pressure elevated to 180/101 in the ER but has been normal and stable during this hospitalization. Not taking lisinopril due to facial swelling. -Continue home HCTZ 25 mg  daily, amlodipine 5 mg daily, and metoprolol 25 mg twice a day.  #Acute kidney injury Creatinine yesterday 1.27, down from 1.5 on admission. Encourage increasing water intake.   #Depression Previously hospitalized in September 2015 with alcohol intoxication and suicidal ideation. Denies any dysphoric mood currently. Was on Zoloft and trazodone for 1 week while he was inpatient. No treatment desired currently.  #DVT prophylaxis Lovenox 80 mg sub Q BID  The patient does have a current PCP (Quentin Angstlugbemiga E Jegede, MD), therefore will not require OPC follow-up after discharge. He has a hospital f/u appointment scheduled.

## 2014-11-07 NOTE — Progress Notes (Signed)
Pt had a 6 beat run of V-Tach at 0211. [facility] called, Dr Allena KatzPatel called and vitals taken. Pt complained of no pain, was sleeping. No orders received. Will continue to monitor

## 2014-11-07 NOTE — Progress Notes (Signed)
Pt discharged to home via wheelchair, condition stable, accompanied by family.

## 2014-11-16 ENCOUNTER — Encounter: Payer: Self-pay | Admitting: Internal Medicine

## 2014-11-16 ENCOUNTER — Ambulatory Visit: Payer: Self-pay | Attending: Internal Medicine | Admitting: Internal Medicine

## 2014-11-16 VITALS — BP 147/90 | HR 77 | Temp 98.0°F | Resp 16 | Wt 373.0 lb

## 2014-11-16 DIAGNOSIS — Z720 Tobacco use: Secondary | ICD-10-CM | POA: Insufficient documentation

## 2014-11-16 DIAGNOSIS — Z6841 Body Mass Index (BMI) 40.0 and over, adult: Secondary | ICD-10-CM | POA: Insufficient documentation

## 2014-11-16 DIAGNOSIS — F17200 Nicotine dependence, unspecified, uncomplicated: Secondary | ICD-10-CM

## 2014-11-16 DIAGNOSIS — R079 Chest pain, unspecified: Secondary | ICD-10-CM | POA: Insufficient documentation

## 2014-11-16 DIAGNOSIS — I10 Essential (primary) hypertension: Secondary | ICD-10-CM | POA: Insufficient documentation

## 2014-11-16 DIAGNOSIS — R7309 Other abnormal glucose: Secondary | ICD-10-CM | POA: Insufficient documentation

## 2014-11-16 DIAGNOSIS — F329 Major depressive disorder, single episode, unspecified: Secondary | ICD-10-CM | POA: Insufficient documentation

## 2014-11-16 DIAGNOSIS — F32A Depression, unspecified: Secondary | ICD-10-CM

## 2014-11-16 DIAGNOSIS — E785 Hyperlipidemia, unspecified: Secondary | ICD-10-CM | POA: Insufficient documentation

## 2014-11-16 DIAGNOSIS — R7303 Prediabetes: Secondary | ICD-10-CM

## 2014-11-16 MED ORDER — AMLODIPINE BESYLATE 5 MG PO TABS: 5.0000 mg | ORAL_TABLET | Freq: Every day | ORAL | Status: DC

## 2014-11-16 MED ORDER — METOPROLOL TARTRATE 25 MG PO TABS: 25.0000 mg | ORAL_TABLET | Freq: Two times a day (BID) | ORAL | Status: DC

## 2014-11-16 MED ORDER — HYDROCHLOROTHIAZIDE 25 MG PO TABS: 25.0000 mg | ORAL_TABLET | Freq: Every day | ORAL | Status: DC

## 2014-11-16 NOTE — Patient Instructions (Addendum)
Fat and Cholesterol Control Diet Fat and cholesterol levels in your blood and organs are influenced by your diet. High levels of fat and cholesterol may lead to diseases of the heart, small and large blood vessels, gallbladder, liver, and pancreas. CONTROLLING FAT AND CHOLESTEROL WITH DIET Although exercise and lifestyle factors are important, your diet is key. That is because certain foods are known to raise cholesterol and others to lower it. The goal is to balance foods for their effect on cholesterol and more importantly, to replace saturated and trans fat with other types of fat, such as monounsaturated fat, polyunsaturated fat, and omega-3 fatty acids. On average, a person should consume no more than 15 to 17 g of saturated fat daily. Saturated and trans fats are considered "bad" fats, and they will raise LDL cholesterol. Saturated fats are primarily found in animal products such as meats, butter, and cream. However, that does not mean you need to give up all your favorite foods. Today, there are good tasting, low-fat, low-cholesterol substitutes for most of the things you like to eat. Choose low-fat or nonfat alternatives. Choose round or loin cuts of red meat. These types of cuts are lowest in fat and cholesterol. Chicken (without the skin), fish, veal, and ground turkey breast are great choices. Eliminate fatty meats, such as hot dogs and salami. Even shellfish have little or no saturated fat. Have a 3 oz (85 g) portion when you eat lean meat, poultry, or fish. Trans fats are also called "partially hydrogenated oils." They are oils that have been scientifically manipulated so that they are solid at room temperature resulting in a longer shelf life and improved taste and texture of foods in which they are added. Trans fats are found in stick margarine, some tub margarines, cookies, crackers, and baked goods.  When baking and cooking, oils are a great substitute for butter. The monounsaturated oils are  especially beneficial since it is believed they lower LDL and raise HDL. The oils you should avoid entirely are saturated tropical oils, such as coconut and palm.  Remember to eat a lot from food groups that are naturally free of saturated and trans fat, including fish, fruit, vegetables, beans, grains (barley, rice, couscous, bulgur wheat), and pasta (without cream sauces).  IDENTIFYING FOODS THAT LOWER FAT AND CHOLESTEROL  Soluble fiber may lower your cholesterol. This type of fiber is found in fruits such as apples, vegetables such as broccoli, potatoes, and carrots, legumes such as beans, peas, and lentils, and grains such as barley. Foods fortified with plant sterols (phytosterol) may also lower cholesterol. You should eat at least 2 g per day of these foods for a cholesterol lowering effect.  Read package labels to identify low-saturated fats, trans fat free, and low-fat foods at the supermarket. Select cheeses that have only 2 to 3 g saturated fat per ounce. Use a heart-healthy tub margarine that is free of trans fats or partially hydrogenated oil. When buying baked goods (cookies, crackers), avoid partially hydrogenated oils. Breads and muffins should be made from whole grains (whole-wheat or whole oat flour, instead of "flour" or "enriched flour"). Buy non-creamy canned soups with reduced salt and no added fats.  FOOD PREPARATION TECHNIQUES  Never deep-fry. If you must fry, either stir-fry, which uses very little fat, or use non-stick cooking sprays. When possible, broil, bake, or roast meats, and steam vegetables. Instead of putting butter or margarine on vegetables, use lemon and herbs, applesauce, and cinnamon (for squash and sweet potatoes). Use nonfat   yogurt, salsa, and low-fat dressings for salads.  LOW-SATURATED FAT / LOW-FAT FOOD SUBSTITUTES Meats / Saturated Fat (g)  Avoid: Steak, marbled (3 oz/85 g) / 11 g  Choose: Steak, lean (3 oz/85 g) / 4 g  Avoid: Hamburger (3 oz/85 g) / 7  g  Choose: Hamburger, lean (3 oz/85 g) / 5 g  Avoid: Ham (3 oz/85 g) / 6 g  Choose: Ham, lean cut (3 oz/85 g) / 2.4 g  Avoid: Chicken, with skin, dark meat (3 oz/85 g) / 4 g  Choose: Chicken, skin removed, dark meat (3 oz/85 g) / 2 g  Avoid: Chicken, with skin, light meat (3 oz/85 g) / 2.5 g  Choose: Chicken, skin removed, light meat (3 oz/85 g) / 1 g Dairy / Saturated Fat (g)  Avoid: Whole milk (1 cup) / 5 g  Choose: Low-fat milk, 2% (1 cup) / 3 g  Choose: Low-fat milk, 1% (1 cup) / 1.5 g  Choose: Skim milk (1 cup) / 0.3 g  Avoid: Hard cheese (1 oz/28 g) / 6 g  Choose: Skim milk cheese (1 oz/28 g) / 2 to 3 g  Avoid: Cottage cheese, 4% fat (1 cup) / 6.5 g  Choose: Low-fat cottage cheese, 1% fat (1 cup) / 1.5 g  Avoid: Ice cream (1 cup) / 9 g  Choose: Sherbet (1 cup) / 2.5 g  Choose: Nonfat frozen yogurt (1 cup) / 0.3 g  Choose: Frozen fruit bar / trace  Avoid: Whipped cream (1 tbs) / 3.5 g  Choose: Nondairy whipped topping (1 tbs) / 1 g Condiments / Saturated Fat (g)  Avoid: Mayonnaise (1 tbs) / 2 g  Choose: Low-fat mayonnaise (1 tbs) / 1 g  Avoid: Butter (1 tbs) / 7 g  Choose: Extra light margarine (1 tbs) / 1 g  Avoid: Coconut oil (1 tbs) / 11.8 g  Choose: Olive oil (1 tbs) / 1.8 g  Choose: Corn oil (1 tbs) / 1.7 g  Choose: Safflower oil (1 tbs) / 1.2 g  Choose: Sunflower oil (1 tbs) / 1.4 g  Choose: Soybean oil (1 tbs) / 2.4 g  Choose: Canola oil (1 tbs) / 1 g Document Released: 08/11/2005 Document Revised: 12/06/2012 Document Reviewed: 11/09/2013 Exit[employer] Patient Information 2015 [employer]. This information is not intended to replace advice given to you by your health care provider. Make sure you discuss any questions you have with your health care provider. DASH Eating Plan DASH stands for "Dietary Approaches to Stop Hypertension." The DASH eating plan is a healthy eating plan that has been shown to reduce high blood pressure  (hypertension). Additional health benefits may include reducing the risk of type 2 diabetes mellitus, heart disease, and stroke. The DASH eating plan may also help with weight loss. WHAT DO I NEED TO KNOW ABOUT THE DASH EATING PLAN? For the DASH eating plan, you will follow these general guidelines:  Choose foods with a percent daily value for sodium of less than 5% (as listed on the food label).  Use salt-free seasonings or herbs instead of table salt or sea salt.  Check with your health care provider or pharmacist before using salt substitutes.  Eat lower-sodium products, often labeled as "lower sodium" or "no salt added."  Eat fresh foods.  Eat more vegetables, fruits, and low-fat dairy products.  Choose whole grains. Look for the word "whole" as the first word in the ingredient list.  Choose fish and skinless chicken or turkey more often than   red meat. Limit fish, poultry, and meat to 6 oz (170 g) each day.  Limit sweets, desserts, sugars, and sugary drinks.  Choose heart-healthy fats.  Limit cheese to 1 oz (28 g) per day.  Eat more home-cooked food and less restaurant, buffet, and fast food.  Limit fried foods.  Cook foods using methods other than frying.  Limit canned vegetables. If you do use them, rinse them well to decrease the sodium.  When eating at a restaurant, ask that your food be prepared with less salt, or no salt if possible. WHAT FOODS CAN I EAT? Seek help from a dietitian for individual calorie needs. Grains Whole grain or whole wheat bread. Brown rice. Whole grain or whole wheat pasta. Quinoa, bulgur, and whole grain cereals. Low-sodium cereals. Corn or whole wheat flour tortillas. Whole grain cornbread. Whole grain crackers. Low-sodium crackers. Vegetables Fresh or frozen vegetables (raw, steamed, roasted, or grilled). Low-sodium or reduced-sodium tomato and vegetable juices. Low-sodium or reduced-sodium tomato sauce and paste. Low-sodium or  reduced-sodium canned vegetables.  Fruits All fresh, canned (in natural juice), or frozen fruits. Meat and Other Protein Products Ground beef (85% or leaner), grass-fed beef, or beef trimmed of fat. Skinless chicken or turkey. Ground chicken or turkey. Pork trimmed of fat. All fish and seafood. Eggs. Dried beans, peas, or lentils. Unsalted nuts and seeds. Unsalted canned beans. Dairy Low-fat dairy products, such as skim or 1% milk, 2% or reduced-fat cheeses, low-fat ricotta or cottage cheese, or plain low-fat yogurt. Low-sodium or reduced-sodium cheeses. Fats and Oils Tub margarines without trans fats. Light or reduced-fat mayonnaise and salad dressings (reduced sodium). Avocado. Safflower, olive, or canola oils. Natural peanut or almond butter. Other Unsalted popcorn and pretzels. The items listed above may not be a complete list of recommended foods or beverages. Contact your dietitian for more options. WHAT FOODS ARE NOT RECOMMENDED? Grains White bread. White pasta. White rice. Refined cornbread. Bagels and croissants. Crackers that contain trans fat. Vegetables Creamed or fried vegetables. Vegetables in a cheese sauce. Regular canned vegetables. Regular canned tomato sauce and paste. Regular tomato and vegetable juices. Fruits Dried fruits. Canned fruit in light or heavy syrup. Fruit juice. Meat and Other Protein Products Fatty cuts of meat. Ribs, chicken wings, bacon, sausage, bologna, salami, chitterlings, fatback, hot dogs, bratwurst, and packaged luncheon meats. Salted nuts and seeds. Canned beans with salt. Dairy Whole or 2% milk, cream, half-and-half, and cream cheese. Whole-fat or sweetened yogurt. Full-fat cheeses or blue cheese. Nondairy creamers and whipped toppings. Processed cheese, cheese spreads, or cheese curds. Condiments Onion and garlic salt, seasoned salt, table salt, and sea salt. Canned and packaged gravies. Worcestershire sauce. Tartar sauce. Barbecue sauce. Teriyaki  sauce. Soy sauce, including reduced sodium. Steak sauce. Fish sauce. Oyster sauce. Cocktail sauce. Horseradish. Ketchup and mustard. Meat flavorings and tenderizers. Bouillon cubes. Hot sauce. Tabasco sauce. Marinades. Taco seasonings. Relishes. Fats and Oils Butter, stick margarine, lard, shortening, ghee, and bacon fat. Coconut, palm kernel, or palm oils. Regular salad dressings. Other Pickles and olives. Salted popcorn and pretzels. The items listed above may not be a complete list of foods and beverages to avoid. Contact your dietitian for more information. WHERE CAN I FIND MORE INFORMATION? National Heart, Lung, and Blood Institute: www.nhlbi.nih.gov/health/health-topics/topics/dash/ Document Released: 07/31/2011 Document Revised: 12/26/2013 Document Reviewed: 06/15/2013 ExitCare Patient Information 2015 [employer]. This information is not intended to replace advice given to you by your health care provider. Make sure you discuss any questions you have with your health care   provider. Diabetes Mellitus and Food It is important for you to manage your blood sugar (glucose) level. Your blood glucose level can be greatly affected by what you eat. Eating healthier foods in the appropriate amounts throughout the day at about the same time each day will help you control your blood glucose level. It can also help slow or prevent worsening of your diabetes mellitus. Healthy eating may even help you improve the level of your blood pressure and reach or maintain a healthy weight.  HOW CAN FOOD AFFECT ME? Carbohydrates Carbohydrates affect your blood glucose level more than any other type of food. Your dietitian will help you determine how many carbohydrates to eat at each meal and teach you how to count carbohydrates. Counting carbohydrates is important to keep your blood glucose at a healthy level, especially if you are using insulin or taking certain medicines for diabetes mellitus. Alcohol Alcohol  can cause sudden decreases in blood glucose (hypoglycemia), especially if you use insulin or take certain medicines for diabetes mellitus. Hypoglycemia can be a life-threatening condition. Symptoms of hypoglycemia (sleepiness, dizziness, and disorientation) are similar to symptoms of having too much alcohol.  If your health care provider has given you approval to drink alcohol, do so in moderation and use the following guidelines:  Women should not have more than one drink per day, and men should not have more than two drinks per day. One drink is equal to:  12 oz of beer.  5 oz of wine.  1 oz of hard liquor.  Do not drink on an empty stomach.  Keep yourself hydrated. Have water, diet soda, or unsweetened iced tea.  Regular soda, juice, and other mixers might contain a lot of carbohydrates and should be counted. WHAT FOODS ARE NOT RECOMMENDED? As you make food choices, it is important to remember that all foods are not the same. Some foods have fewer nutrients per serving than other foods, even though they might have the same number of calories or carbohydrates. It is difficult to get your body what it needs when you eat foods with fewer nutrients. Examples of foods that you should avoid that are high in calories and carbohydrates but low in nutrients include:  Trans fats (most processed foods list trans fats on the Nutrition Facts label).  Regular soda.  Juice.  Candy.  Sweets, such as cake, pie, doughnuts, and cookies.  Fried foods. WHAT FOODS CAN I EAT? Have nutrient-rich foods, which will nourish your body and keep you healthy. The food you should eat also will depend on several factors, including:  The calories you need.  The medicines you take.  Your weight.  Your blood glucose level.  Your blood pressure level.  Your cholesterol level. You also should eat a variety of foods, including:  Protein, such as meat, poultry, fish, tofu, nuts, and seeds (lean animal  proteins are best).  Fruits.  Vegetables.  Dairy products, such as milk, cheese, and yogurt (low fat is best).  Breads, grains, pasta, cereal, rice, and beans.  Fats such as olive oil, trans fat-free margarine, canola oil, avocado, and olives. DOES EVERYONE WITH DIABETES MELLITUS HAVE THE SAME MEAL PLAN? Because every person with diabetes mellitus is different, there is not one meal plan that works for everyone. It is very important that you meet with a dietitian who will help you create a meal plan that is just right for you. Document Released: 05/08/2005 Document Revised: 08/16/2013 Document Reviewed: 07/08/2013 ExitCare Patient Information 2015 [place]. This information is not intended to replace advice given to you by your health care provider. Make sure you discuss any questions you have with your health care provider. Smoking Cessation Quitting smoking is important to your health and has many advantages. However, it is not always easy to quit since nicotine is a very addictive drug. Oftentimes, people try 3 times or more before being able to quit. This document explains the best ways for you to prepare to quit smoking. Quitting takes hard work and a lot of effort, but you can do it. ADVANTAGES OF QUITTING SMOKING  You will live longer, feel better, and live better.  Your body will feel the impact of quitting smoking almost immediately.  Within 20 minutes, blood pressure decreases. Your pulse returns to its normal level.  After 8 hours, carbon monoxide levels in the blood return to normal. Your oxygen level increases.  After 24 hours, the chance of having a heart attack starts to decrease. Your breath, hair, and body stop smelling like smoke.  After 48 hours, damaged nerve endings begin to recover. Your sense of taste and smell improve.  After 72 hours, the body is virtually free of nicotine. Your bronchial tubes relax and breathing becomes easier.  After 2 to 12 weeks,  lungs can hold more air. Exercise becomes easier and circulation improves.  The risk of having a heart attack, stroke, cancer, or lung disease is greatly reduced.  After 1 year, the risk of coronary heart disease is cut in half.  After 5 years, the risk of stroke falls to the same as a nonsmoker.  After 10 years, the risk of lung cancer is cut in half and the risk of other cancers decreases significantly.  After 15 years, the risk of coronary heart disease drops, usually to the level of a nonsmoker.  If you are pregnant, quitting smoking will improve your chances of having a healthy baby.  The people you live with, especially any children, will be healthier.  You will have extra money to spend on things other than cigarettes. QUESTIONS TO THINK ABOUT BEFORE ATTEMPTING TO QUIT You may want to talk about your answers with your health care provider.  Why do you want to quit?  If you tried to quit in the past, what helped and what did not?  What will be the most difficult situations for you after you quit? How will you plan to handle them?  Who can help you through the tough times? Your family? Friends? A health care provider?  What pleasures do you get from smoking? What ways can you still get pleasure if you quit? Here are some questions to ask your health care provider:  How can you help me to be successful at quitting?  What medicine do you think would be best for me and how should I take it?  What should I do if I need more help?  What is smoking withdrawal like? How can I get information on withdrawal? GET READY  Set a quit date.  Change your environment by getting rid of all cigarettes, ashtrays, matches, and lighters in your home, car, or work. Do not let people smoke in your home.  Review your past attempts to quit. Think about what worked and what did not. GET SUPPORT AND ENCOURAGEMENT You have a better chance of being successful if you have help. You can get  support in many ways.  Tell your family, friends, and coworkers that you are going to  quit and need their support. Ask them not to smoke around you.  Get individual, group, or telephone counseling and support. Programs are available at [facility] and [facility]. Call your local health department for information about programs in your area.  Spiritual beliefs and practices may help some smokers quit.  Download a "quit meter" on your computer to keep track of quit statistics, such as how long you have gone without smoking, cigarettes not smoked, and money saved.  Get a self-help book about quitting smoking and staying off tobacco. LEARN NEW SKILLS AND BEHAVIORS  Distract yourself from urges to smoke. Talk to someone, go for a walk, or occupy your time with a task.  Change your normal routine. Take a different route to work. Drink tea instead of coffee. Eat breakfast in a different place.  Reduce your stress. Take a hot bath, exercise, or read a book.  Plan something enjoyable to do every day. Reward yourself for not smoking.  Explore interactive web-based programs that specialize in helping you quit. GET MEDICINE AND USE IT CORRECTLY Medicines can help you stop smoking and decrease the urge to smoke. Combining medicine with the above behavioral methods and support can greatly increase your chances of successfully quitting smoking.  Nicotine replacement therapy helps deliver nicotine to your body without the negative effects and risks of smoking. Nicotine replacement therapy includes nicotine gum, lozenges, inhalers, nasal sprays, and skin patches. Some may be available over-the-counter and others require a prescription.  Antidepressant medicine helps people abstain from smoking, but how this works is unknown. This medicine is available by prescription.  Nicotinic receptor partial agonist medicine simulates the effect of nicotine in your brain. This medicine is available by  prescription. Ask your health care provider for advice about which medicines to use and how to use them based on your health history. Your health care provider will tell you what side effects to look out for if you choose to be on a medicine or therapy. Carefully read the information on the package. Do not use any other product containing nicotine while using a nicotine replacement product.  RELAPSE OR DIFFICULT SITUATIONS Most relapses occur within the first 3 months after quitting. Do not be discouraged if you start smoking again. Remember, most people try several times before finally quitting. You may have symptoms of withdrawal because your body is used to nicotine. You may crave cigarettes, be irritable, feel very hungry, cough often, get headaches, or have difficulty concentrating. The withdrawal symptoms are only temporary. They are strongest when you first quit, but they will go away within 10-14 days. To reduce the chances of relapse, try to:  Avoid drinking alcohol. Drinking lowers your chances of successfully quitting.  Reduce the amount of caffeine you consume. Once you quit smoking, the amount of caffeine in your body increases and can give you symptoms, such as a rapid heartbeat, sweating, and anxiety.  Avoid smokers because they can make you want to smoke.  Do not let weight gain distract you. Many smokers will gain weight when they quit, usually less than 10 pounds. Eat a healthy diet and stay active. You can always lose the weight gained after you quit.  Find ways to improve your mood other than smoking. FOR MORE INFORMATION  www.smokefree.gov  Document Released: 08/05/2001 Document Revised: 12/26/2013 Document Reviewed: 11/20/2011 [facility] Patient Information 2015 [place]. This information is not intended to replace advice given to you by your health care provider. Make sure you discuss any  questions you have with your health care provider.  

## 2014-11-16 NOTE — Progress Notes (Signed)
MRN: [id number] Name: [name] Donovan  Sex: male Age: 44 y.o. DOB: [birth year]  Allergies: Penicillins; Lipitor; and Lisinopril  Chief Complaint  Patient presents with  . Hospitalization Follow-up    HPI: Patient is 44 y.o. male who has history of hypertension, depression, recently hospitalized with symptoms of chest pain , shortness of breath, EMR reviewed, initially patient had EKG with nonspecific ST changes, troponins were negative had a normal stress test, echocardiogram was unremarkable, found to have elevated LDL and he was started on Crestor 20 mg, also found to have hemoglobin A1c of 6.1%, patient has prediabetes, for blood pressure he was continued on hydrochlorothiazide, amlodipine and metoprolol, patient is requesting refill on his medications her, today's blood pressure is borderline elevated denies any headache dizziness chest and shortness of breath, patient is also following up at [facility], he currently denies any SI or HI  Past Medical History  Diagnosis Date  . Hypertension   . Depression     Past Surgical History  Procedure Laterality Date  . Wrist surgery        Medication List       This list is accurate as of: 11/16/14 12:27 PM.  Always use your most recent med list.               amLODipine 5 MG tablet  Commonly known as:  NORVASC  Take 1 tablet (5 mg total) by mouth daily.     hydrochlorothiazide 25 MG tablet  Commonly known as:  HYDRODIURIL  Take 1 tablet (25 mg total) by mouth daily.     HYDROcodone-acetaminophen 5-325 MG per tablet  Commonly known as:  NORCO/VICODIN  Take 1 tablet by mouth every 6 (six) hours as needed for moderate pain or severe pain.     ibuprofen 200 MG tablet  Commonly known as:  ADVIL,MOTRIN  Take 600-800 mg by mouth every 8 (eight) hours as needed for moderate pain.     metoprolol tartrate 25 MG tablet  Commonly known as:  LOPRESSOR  Take 1 tablet (25 mg total) by mouth 2 (two) times daily.     nitroGLYCERIN  0.4 MG SL tablet  Commonly known as:  NITROSTAT  Place 1 tablet (0.4 mg total) under the tongue every 5 (five) minutes x 3 doses as needed for chest pain.     rosuvastatin 20 MG tablet  Commonly known as:  CRESTOR  Take 1 tablet (20 mg total) by mouth daily at 6 PM.     sertraline 50 MG tablet  Commonly known as:  ZOLOFT  Take 1 tablet (50 mg total) by mouth daily. For depression     traZODone 50 MG tablet  Commonly known as:  DESYREL  Take 1 tablet (50 mg total) by mouth at bedtime. Sleep        Meds ordered this encounter  Medications  . amLODipine (NORVASC) 5 MG tablet    Sig: Take 1 tablet (5 mg total) by mouth daily.    Dispense:  30 tablet    Refill:  2  . hydrochlorothiazide (HYDRODIURIL) 25 MG tablet    Sig: Take 1 tablet (25 mg total) by mouth daily.    Dispense:  30 tablet    Refill:  2  . metoprolol tartrate (LOPRESSOR) 25 MG tablet    Sig: Take 1 tablet (25 mg total) by mouth 2 (two) times daily.    Dispense:  60 tablet    Refill:  2    Immunization History  Administered  Date(s) Administered  . Tdap 08/15/2012    Family History  Problem Relation Age of Onset  . Hypertension Mother   . Heart disease Father     Rare heart disease  . Heart disease Brother     Rare heart disease (died)    History  Substance Use Topics  . Smoking status: Light Tobacco Smoker -- 0.20 packs/day    Types: Cigarettes  . Smokeless tobacco: Never Used  . Alcohol Use: No    Review of Systems   As noted in HPI  Filed Vitals:   11/16/14 1204  BP: 147/90  Pulse: 77  Temp: 98 F (36.7 C)  Resp: 16    Physical Exam  Physical Exam  Constitutional:  Obese male sitting comfortably not in acute distress  Eyes: EOM are normal. Pupils are equal, round, and reactive to light.  Cardiovascular: Normal rate and regular rhythm.   Pulmonary/Chest: Breath sounds normal. No respiratory distress. He has no wheezes. He has no rales.  Abdominal: Soft. There is no tenderness.    Musculoskeletal: He exhibits no edema.    CBC    Component Value Date/Time   WBC 8.7 11/04/2014 1849   RBC 5.74 11/04/2014 1849   HGB 13.8 11/04/2014 1849   HCT 42.4 11/04/2014 1849   PLT 212 11/04/2014 1849   MCV 73.9* 11/04/2014 1849   LYMPHSABS 2.1 04/28/2013 1457   MONOABS 0.6 04/28/2013 1457   EOSABS 0.2 04/28/2013 1457   BASOSABS 0.0 04/28/2013 1457    CMP     Component Value Date/Time   NA 137 11/06/2014 0437   K 4.1 11/06/2014 0437   CL 102 11/06/2014 0437   CO2 31 11/06/2014 0437   GLUCOSE 96 11/06/2014 0437   BUN 18 11/06/2014 0437   CREATININE 1.27 11/06/2014 0437   CREATININE 1.16 04/28/2013 1457   CALCIUM 8.9 11/06/2014 0437   PROT 7.7 05/17/2014 2247   ALBUMIN 3.7 05/17/2014 2247   AST 33 05/17/2014 2247   ALT 25 05/17/2014 2247   ALKPHOS 99 05/17/2014 2247   BILITOT 0.2* 05/17/2014 2247   GFRNONAA 68* 11/06/2014 0437   GFRAA 79* 11/06/2014 0437    Lab Results  Component Value Date/Time   CHOL 200 11/05/2014 12:50 PM    No components found for: HGA1C  Lab Results  Component Value Date/Time   AST 33 05/17/2014 10:47 PM    Assessment and Plan  Essential hypertension - Plan:advised patient for DASH diet, resume back on  amLODipine (NORVASC) 5 MG tablet, hydrochlorothiazide (HYDRODIURIL) 25 MG tablet, metoprolol tartrate (LOPRESSOR) 25 MG tablet Will check blood chemistry on the following visit  Prediabetes Recent A1c was 6.1%, I have advised patient for low carbohydrate diet. Will repeat the test on the following visit.  Chest pain, unspecified chest pain type Atypical, recent stress test negative, patient take aspirin and recently has been started on statins  Hyperlipidemia As per patient he will start taking Crestor, we'll check fasting lipid panel on the following visit.  Smoking Consultation to quit smoking.  Morbid obesity-BMI 50 Reinforced diet and exercise  Depression Symptoms are stable, currently patient reported [facility]  [facility]  Health Maintenance  -Vaccinations:  Patient declined flu shot,he will think about Pneumovax  Return in about 3 months (around 02/16/2015) for hypertension, hyperipidemia.   This note has been created with Education officer, environmentalDragon speech recognition software and smart phrase technology. Any transcriptional errors are unintentional.    Doris CheadleADVANI, Julisa Flippo, MD

## 2014-11-16 NOTE — Progress Notes (Signed)
Patient here for hospital follow up Patient was admitted with chest pain All tests were negative Currently taking medication for hypertension

## 2014-12-13 ENCOUNTER — Ambulatory Visit: Payer: Self-pay

## 2014-12-26 ENCOUNTER — Emergency Department (HOSPITAL_COMMUNITY)
Admission: EM | Admit: 2014-12-26 | Discharge: 2014-12-27 | Disposition: A | Discharge status: Home / Self Care | Payer: Self-pay | Attending: Emergency Medicine | Admitting: Emergency Medicine

## 2014-12-26 ENCOUNTER — Encounter (HOSPITAL_COMMUNITY): Payer: Self-pay | Admitting: Emergency Medicine

## 2014-12-26 DIAGNOSIS — I10 Essential (primary) hypertension: Secondary | ICD-10-CM | POA: Insufficient documentation

## 2014-12-26 DIAGNOSIS — G479 Sleep disorder, unspecified: Secondary | ICD-10-CM | POA: Insufficient documentation

## 2014-12-26 DIAGNOSIS — R45851 Suicidal ideations: Secondary | ICD-10-CM | POA: Insufficient documentation

## 2014-12-26 DIAGNOSIS — Z79899 Other long term (current) drug therapy: Secondary | ICD-10-CM | POA: Insufficient documentation

## 2014-12-26 DIAGNOSIS — Z720 Tobacco use: Secondary | ICD-10-CM | POA: Insufficient documentation

## 2014-12-26 DIAGNOSIS — Z880 Allergy status to penicillin: Secondary | ICD-10-CM | POA: Insufficient documentation

## 2014-12-26 LAB — COMPREHENSIVE METABOLIC PANEL
ALK PHOS: 85 U/L (ref 38–126)
ALT: 29 U/L (ref 17–63)
AST: 34 U/L (ref 15–41)
Albumin: 4 g/dL (ref 3.5–5.0)
Anion gap: 8 (ref 5–15)
BUN: 9 mg/dL (ref 6–20)
CO2: 28 mmol/L (ref 22–32)
Calcium: 8.9 mg/dL (ref 8.9–10.3)
Chloride: 105 mmol/L (ref 101–111)
Creatinine, Ser: 1.2 mg/dL (ref 0.61–1.24)
GFR calc non Af Amer: >60 mL/min (ref >60)
GLUCOSE: 166 mg/dL — AB (ref 70–99)
Potassium: 4.2 mmol/L (ref 3.5–5.1)
Sodium: 141 mmol/L (ref 135–145)
Total Bilirubin: 0.6 mg/dL (ref 0.3–1.2)
Total Protein: 7.6 g/dL (ref 6.5–8.1)

## 2014-12-26 LAB — RAPID URINE DRUG SCREEN, HOSP PERFORMED
Amphetamines: NOT DETECTED
BARBITURATES: NOT DETECTED
BENZODIAZEPINES: NOT DETECTED
Cocaine: NOT DETECTED
Opiates: NOT DETECTED
TETRAHYDROCANNABINOL: NOT DETECTED

## 2014-12-26 LAB — CBC
HCT: 45.3 % (ref 39.0–52.0)
Hemoglobin: 14.8 g/dL (ref 13.0–17.0)
MCH: 23.9 pg — AB (ref 26.0–34.0)
MCHC: 32.7 g/dL (ref 30.0–36.0)
MCV: 73.1 fL — ABNORMAL LOW (ref 78.0–100.0)
PLATELETS: 243 10*3/uL (ref 150–400)
RBC: 6.2 MIL/uL — ABNORMAL HIGH (ref 4.22–5.81)
RDW: 15.6 % — AB (ref 11.5–15.5)
WBC: 9.8 10*3/uL (ref 4.0–10.5)

## 2014-12-26 LAB — ACETAMINOPHEN LEVEL: Acetaminophen (Tylenol), Serum: <10 ug/mL — ABNORMAL LOW (ref 10–30)

## 2014-12-26 LAB — ETHANOL

## 2014-12-26 LAB — SALICYLATE LEVEL

## 2014-12-26 MED ORDER — LORAZEPAM 1 MG PO TABS
0.0000 mg | ORAL_TABLET | Freq: Four times a day (QID) | ORAL | Status: DC
  Administered 2014-12-27 (×2): 2 mg via ORAL
  Filled 2014-12-26 (×2): qty 2

## 2014-12-26 MED ORDER — LORAZEPAM 1 MG PO TABS: 0.0000 mg | ORAL_TABLET | Freq: Two times a day (BID) | ORAL | Status: DC

## 2014-12-26 MED ORDER — AMLODIPINE BESYLATE 5 MG PO TABS
5.0000 mg | ORAL_TABLET | Freq: Every day | ORAL | Status: DC
  Administered 2014-12-26 – 2014-12-27 (×2): 5 mg via ORAL
  Filled 2014-12-26 (×2): qty 1

## 2014-12-26 MED ORDER — HYDROCHLOROTHIAZIDE 25 MG PO TABS
25.0000 mg | ORAL_TABLET | Freq: Every day | ORAL | Status: DC
  Administered 2014-12-26 – 2014-12-27 (×2): 25 mg via ORAL
  Filled 2014-12-26 (×2): qty 1

## 2014-12-26 MED ORDER — METOPROLOL TARTRATE 25 MG PO TABS
25.0000 mg | ORAL_TABLET | Freq: Two times a day (BID) | ORAL | Status: DC
  Administered 2014-12-26 – 2014-12-27 (×2): 25 mg via ORAL
  Filled 2014-12-26 (×2): qty 1

## 2014-12-26 MED ORDER — THIAMINE HCL 100 MG/ML IJ SOLN: 100.0000 mg | Freq: Every day | INTRAMUSCULAR | Status: DC

## 2014-12-26 MED ORDER — VITAMIN B-1 100 MG PO TABS
100.0000 mg | ORAL_TABLET | Freq: Every day | ORAL | Status: DC
Start: 2014-12-27 — End: 2014-12-27
  Administered 2014-12-27: 100 mg via ORAL
  Filled 2014-12-26: qty 1

## 2014-12-26 NOTE — ED Provider Notes (Signed)
CSN: [id number]     Arrival date & time 12/26/14  1827 History   First MD Initiated Contact with Patient 12/26/14 1858     Chief Complaint  Patient presents with  . IVC    . Suicidal     (Consider location/radiation/quality/duration/timing/severity/associated sxs/prior Treatment) The history is provided by the patient.  [name] Donovan is a 44 y.o. male history depression, hypertension here presenting with suicidal attempt. Patient tried to hang himself several days ago and the belt broke. He has been depressed for several weeks. Has been unable to sleep very well. He went to [facility] was sent for evaluation. IVC paperwork was filled out by [employer]. Patient also has history of hypertension and is on Norvasc, hydrochlorothiazide, metoprolol but has not been taking them. Also has been drinking more alcohol and not taking his psychiatric medicines.    Past Medical History  Diagnosis Date  . Hypertension   . Depression    Past Surgical History  Procedure Laterality Date  . Wrist surgery     Family History  Problem Relation Age of Onset  . Hypertension Mother   . Heart disease Father     Rare heart disease  . Heart disease Brother     Rare heart disease (died)   History  Substance Use Topics  . Smoking status: Light Tobacco Smoker -- 0.20 packs/day    Types: Cigarettes  . Smokeless tobacco: Never Used  . Alcohol Use: 0.6 oz/week    0 Standard drinks or equivalent, 1 Cans of beer per week    Review of Systems  Psychiatric/Behavioral: Positive for suicidal ideas, sleep disturbance, self-injury and dysphoric mood. The patient is nervous/anxious.   All other systems reviewed and are negative.     Allergies  Penicillins; Lipitor; and Lisinopril  Home Medications   Prior to Admission medications   Medication Sig Start Date End Date Taking? Authorizing Provider  amLODipine (NORVASC) 5 MG tablet Take 1 tablet (5 mg total) by mouth daily. 11/16/14  Yes Doris Cheadle, MD   hydrochlorothiazide (HYDRODIURIL) 25 MG tablet Take 1 tablet (25 mg total) by mouth daily. 11/16/14  Yes Doris Cheadle, MD  ibuprofen (ADVIL,MOTRIN) 200 MG tablet Take 600-800 mg by mouth every 8 (eight) hours as needed for moderate pain.   Yes Historical Provider, MD  metoprolol tartrate (LOPRESSOR) 25 MG tablet Take 1 tablet (25 mg total) by mouth 2 (two) times daily. 11/16/14  Yes Doris Cheadle, MD  HYDROcodone-acetaminophen (NORCO/VICODIN) 5-325 MG per tablet Take 1 tablet by mouth every 6 (six) hours as needed for moderate pain or severe pain. Patient not taking: Reported on 11/05/2014 08/31/14   Fayrene Helper, PA-C  nitroGLYCERIN (NITROSTAT) 0.4 MG SL tablet Place 1 tablet (0.4 mg total) under the tongue every 5 (five) minutes x 3 doses as needed for chest pain. 11/07/14   Harold Barban, MD  rosuvastatin (CRESTOR) 20 MG tablet Take 1 tablet (20 mg total) by mouth daily at 6 PM. Patient not taking: Reported on 12/26/2014 11/07/14   Harold Barban, MD  sertraline (ZOLOFT) 50 MG tablet Take 1 tablet (50 mg total) by mouth daily. For depression Patient not taking: Reported on 11/05/2014 04/26/14   Sanjuana Kava, NP  traZODone (DESYREL) 50 MG tablet Take 1 tablet (50 mg total) by mouth at bedtime. Sleep Patient not taking: Reported on 11/05/2014 04/26/14   Sanjuana Kava, NP   BP 171/113 mmHg  Pulse 109  Temp(Src) 98.8 F (37.1 C) (Oral)  Resp 20  SpO2 94%  Physical Exam  Constitutional: He is oriented to person, place, and time.  Depressed   HENT:  Head: Normocephalic.  Mouth/Throat: Oropharynx is clear and moist.  Eyes: Conjunctivae are normal. Pupils are equal, round, and reactive to light.  Neck: Normal range of motion. Neck supple.  No obvious strangle mark. No stridor. Nl ROM neck. No midline tenderness   Cardiovascular: Normal rate, regular rhythm and normal heart sounds.   Pulmonary/Chest: Effort normal and breath sounds normal. No respiratory distress. He has no wheezes. He has no rales.   Abdominal: Soft. Bowel sounds are normal. He exhibits no distension. There is no tenderness. There is no rebound and no guarding.  Musculoskeletal: Normal range of motion. He exhibits no edema or tenderness.  Neurological: He is alert and oriented to person, place, and time. No cranial nerve deficit. Coordination normal.  Skin: Skin is warm and dry.  Psychiatric:  Poor judgment. Depressed mood   Nursing note and vitals reviewed.   ED Course  Procedures (including critical care time) Labs Review Labs Reviewed  ACETAMINOPHEN LEVEL - Abnormal; Notable for the following:    Acetaminophen (Tylenol), Serum <10 (*)    All other components within normal limits  CBC - Abnormal; Notable for the following:    RBC 6.20 (*)    MCV 73.1 (*)    MCH 23.9 (*)    RDW 15.6 (*)    All other components within normal limits  COMPREHENSIVE METABOLIC PANEL - Abnormal; Notable for the following:    Glucose, Bld 166 (*)    All other components within normal limits  ETHANOL  SALICYLATE LEVEL  URINE RAPID DRUG SCREEN (HOSP PERFORMED)    Imaging Review No results found.   EKG Interpretation None      MDM   Final diagnoses:  None   [name] BottcherChristopher Donovan is a 44 y.o. male here with depression, suicidal ideation. Filled out IVC. Will get psych clearance labs. I doubt hypertensive emergency. Will restart PO BP meds.   11:26 PM  Given BP meds. Medically cleared. Will consult TTS. Still hypertensive after meds. Will continue home BP meds.      Richardean Canalavid H Athena Baltz, MD 12/26/14 [phone]

## 2014-12-26 NOTE — ED Notes (Signed)
[facility], sent here IVC for medical clearance due to hypertension. Pt states he recently attempted suicide by hanging himself a few days ago. Pt c/o racing thoughts and insomnia. Pt currently denies plan to attempt suicide. Pt denies HI, denies AVH. [facility]

## 2014-12-27 ENCOUNTER — Inpatient Hospital Stay (HOSPITAL_COMMUNITY)
Admission: EM | Admit: 2014-12-27 | Discharge: 2015-01-04 | DRG: 881 | Disposition: A | Discharge status: Home / Self Care | Payer: Federal, State, Local not specified - Other | Source: Intra-hospital | Attending: Psychiatry | Admitting: Psychiatry

## 2014-12-27 ENCOUNTER — Encounter (HOSPITAL_COMMUNITY): Payer: Self-pay | Admitting: *Deleted

## 2014-12-27 DIAGNOSIS — Z590 Homelessness: Secondary | ICD-10-CM | POA: Diagnosis not present

## 2014-12-27 DIAGNOSIS — I10 Essential (primary) hypertension: Secondary | ICD-10-CM | POA: Diagnosis present

## 2014-12-27 DIAGNOSIS — G4700 Insomnia, unspecified: Secondary | ICD-10-CM | POA: Diagnosis present

## 2014-12-27 DIAGNOSIS — M25432 Effusion, left wrist: Secondary | ICD-10-CM | POA: Diagnosis present

## 2014-12-27 DIAGNOSIS — F17210 Nicotine dependence, cigarettes, uncomplicated: Secondary | ICD-10-CM | POA: Diagnosis present

## 2014-12-27 DIAGNOSIS — F1020 Alcohol dependence, uncomplicated: Secondary | ICD-10-CM | POA: Diagnosis present

## 2014-12-27 DIAGNOSIS — F1994 Other psychoactive substance use, unspecified with psychoactive substance-induced mood disorder: Secondary | ICD-10-CM | POA: Diagnosis present

## 2014-12-27 DIAGNOSIS — R45851 Suicidal ideations: Secondary | ICD-10-CM | POA: Diagnosis present

## 2014-12-27 DIAGNOSIS — F329 Major depressive disorder, single episode, unspecified: Secondary | ICD-10-CM | POA: Diagnosis present

## 2014-12-27 DIAGNOSIS — M25539 Pain in unspecified wrist: Secondary | ICD-10-CM

## 2014-12-27 DIAGNOSIS — E785 Hyperlipidemia, unspecified: Secondary | ICD-10-CM | POA: Diagnosis present

## 2014-12-27 DIAGNOSIS — T1491 Suicide attempt: Secondary | ICD-10-CM | POA: Diagnosis not present

## 2014-12-27 MED ORDER — HYDROCHLOROTHIAZIDE 25 MG PO TABS
25.0000 mg | ORAL_TABLET | Freq: Every day | ORAL | Status: DC
  Administered 2014-12-28 – 2014-12-30 (×3): 25 mg via ORAL
  Filled 2014-12-27 (×5): qty 1

## 2014-12-27 MED ORDER — LORAZEPAM 1 MG PO TABS
1.0000 mg | ORAL_TABLET | Freq: Two times a day (BID) | ORAL | Status: AC
  Administered 2014-12-29 (×2): 1 mg via ORAL
  Filled 2014-12-27: qty 1

## 2014-12-27 MED ORDER — LORAZEPAM 1 MG PO TABS: 1.0000 mg | ORAL_TABLET | Freq: Four times a day (QID) | ORAL | Status: AC | PRN

## 2014-12-27 MED ORDER — LORAZEPAM 1 MG PO TABS: 0.0000 mg | ORAL_TABLET | Freq: Four times a day (QID) | ORAL | Status: DC

## 2014-12-27 MED ORDER — LORAZEPAM 1 MG PO TABS
1.0000 mg | ORAL_TABLET | Freq: Every day | ORAL | Status: AC
  Administered 2014-12-30: 1 mg via ORAL
  Filled 2014-12-27: qty 1

## 2014-12-27 MED ORDER — VITAMIN B-1 100 MG PO TABS: 100.0000 mg | ORAL_TABLET | Freq: Every day | ORAL | Status: DC

## 2014-12-27 MED ORDER — ADULT MULTIVITAMIN W/MINERALS CH
1.0000 | ORAL_TABLET | Freq: Every day | ORAL | Status: DC
  Administered 2014-12-27 – 2015-01-03 (×8): 1 via ORAL
  Filled 2014-12-27 (×10): qty 1

## 2014-12-27 MED ORDER — ONDANSETRON 4 MG PO TBDP: 4.0000 mg | ORAL_TABLET | Freq: Four times a day (QID) | ORAL | Status: AC | PRN

## 2014-12-27 MED ORDER — LOPERAMIDE HCL 2 MG PO CAPS: 2.0000 mg | ORAL_CAPSULE | ORAL | Status: AC | PRN

## 2014-12-27 MED ORDER — LORAZEPAM 1 MG PO TABS
1.0000 mg | ORAL_TABLET | Freq: Four times a day (QID) | ORAL | Status: AC
  Administered 2014-12-27 (×3): 1 mg via ORAL
  Filled 2014-12-27 (×3): qty 1

## 2014-12-27 MED ORDER — LORAZEPAM 1 MG PO TABS
1.0000 mg | ORAL_TABLET | Freq: Three times a day (TID) | ORAL | Status: AC
  Administered 2014-12-28 – 2014-12-29 (×3): 1 mg via ORAL
  Filled 2014-12-27 (×3): qty 1

## 2014-12-27 MED ORDER — METOPROLOL TARTRATE 25 MG PO TABS
25.0000 mg | ORAL_TABLET | Freq: Two times a day (BID) | ORAL | Status: DC
  Administered 2014-12-27 – 2015-01-04 (×16): 25 mg via ORAL
  Filled 2014-12-27 (×18): qty 1

## 2014-12-27 MED ORDER — THIAMINE HCL 100 MG/ML IJ SOLN: 100.0000 mg | Freq: Every day | INTRAMUSCULAR | Status: DC

## 2014-12-27 MED ORDER — AMLODIPINE BESYLATE 5 MG PO TABS
5.0000 mg | ORAL_TABLET | Freq: Every day | ORAL | Status: DC
  Administered 2014-12-28 – 2014-12-31 (×4): 5 mg via ORAL
  Filled 2014-12-27 (×5): qty 1

## 2014-12-27 MED ORDER — VITAMIN B-1 100 MG PO TABS
100.0000 mg | ORAL_TABLET | Freq: Every day | ORAL | Status: DC
  Administered 2014-12-28 – 2015-01-03 (×7): 100 mg via ORAL
  Filled 2014-12-27 (×9): qty 1

## 2014-12-27 MED ORDER — HYDROXYZINE HCL 25 MG PO TABS
25.0000 mg | ORAL_TABLET | Freq: Four times a day (QID) | ORAL | Status: AC | PRN
  Administered 2014-12-28: 25 mg via ORAL
  Filled 2014-12-27: qty 1

## 2014-12-27 MED ORDER — LORAZEPAM 1 MG PO TABS: 0.0000 mg | ORAL_TABLET | Freq: Two times a day (BID) | ORAL | Status: DC

## 2014-12-27 NOTE — [facility] Suicide Risk Assessment (Signed)
[facility]   Nursing information obtained from:  Patient, Review of record Demographic factors:  Male, Low socioeconomic status, Living alone, Unemployed Current Mental Status:  Suicidal ideation indicated by patient, Suicide plan, Plan includes specific time, place, or method, Self-harm thoughts, Intention to act on suicide plan, Belief that plan would result in death Loss Factors:  Financial problems / change in socioeconomic status, Loss of significant relationship (father died at 4413, patient began drinking then) Historical Factors:  Prior suicide attempts Risk Reduction Factors:  Sense of responsibility to family Total Time spent with patient: 45 minutes Principal Problem: Substance induced mood disorder Diagnosis:   Patient Active Problem List   Diagnosis Date Noted  . Substance induced mood disorder [F19.94] 12/27/2014  . Chest pain [R07.9] 11/05/2014  . HTN (hypertension) [I10] 11/05/2014  . Pain in the chest [R07.9]   . History of Alcohol dependence with withdrawal [F10.230] 05/19/2014  . 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 Suicidal ideations [R45.851] 05/19/2014  . Alcohol dependence [F10.20] 04/24/2014  . Major depression [F32.2] 04/22/2014  . Accelerated hypertension [I10] 04/28/2013  . Obstructive sleep apnea [G47.33] 04/28/2013  . Loss of weight [R63.4] 04/28/2013  . Morbid obesity-BMI 50 [E66.01] 04/28/2013  . OSA (obstructive sleep apnea) [G47.33] 04/28/2013     Continued Clinical Symptoms:  Alcohol Use Disorder Identification Test Final Score (AUDIT): 23 The "Alcohol Use Disorders Identification Test", Guidelines for Use in Primary Care, Second Edition.  World Science writerHealth Organization [facility](WHO). Score between 0-7:  no or low risk or alcohol related problems. Score between 8-15:  moderate risk of alcohol related problems. Score between 16-19:  high risk of alcohol related problems. Score 20 or above:  warrants  further diagnostic evaluation for alcohol dependence and treatment.   CLINICAL FACTORS:   Depression:   Comorbid alcohol abuse/dependence Alcohol/Substance Abuse/Dependencies   Musculoskeletal: Strength & Muscle Tone: within normal limits Gait & Station: normal Patient leans: N/A  Psychiatric Specialty Exam: Physical Exam  Review of Systems  Constitutional: Positive for malaise/fatigue.  Eyes: Negative.   Respiratory: Negative.   Cardiovascular: Negative.   Gastrointestinal: Negative.   Genitourinary: Negative.   Musculoskeletal: Negative.   Skin: Negative.   Neurological: Positive for weakness and headaches.  Endo/Heme/Allergies: Negative.   Psychiatric/Behavioral: Positive for depression, suicidal ideas and substance abuse. The patient is nervous/anxious and has insomnia.     Blood pressure 160/93, pulse 78, temperature 98.3 F (36.8 C), temperature source Oral, resp. rate 20, height 5' 9.5" (1.765 m), weight 159.666 kg (352 lb).Body mass index is 51.25 kg/(m^2).  General Appearance: Disheveled  Eye SolicitorContact::  Fair  Speech:  Clear and Coherent, Slow and not spontaneous  Volume:  Decreased  Mood:  Anxious and Depressed  Affect:  Depressed and Restricted  Thought Process:  Coherent and Goal Directed  Orientation:  Full (Time, Place, and Person)  Thought Content:  symptoms events worries concerns  Suicidal Thoughts:  Yes.  without intent/plan  Homicidal Thoughts:  No  Memory:  Immediate;   Fair Recent;   Fair Remote;   Fair  Judgement:  Fair  Insight:  Present and Shallow  Psychomotor Activity:  Restlessness  Concentration:  Fair  Recall:  FiservFair  Fund of Knowledge:Fair  Language: Fair  Akathisia:  No  Handed:  Right  AIMS (if indicated):     Assets:  Desire for Improvement  Sleep:     Cognition: WNL  ADL's:  Intact     COGNITIVE FEATURES THAT CONTRIBUTE TO  RISK:  Closed-mindedness, Polarized thinking and Thought constriction (tunnel vision)    SUICIDE  RISK:   Moderate:  Frequent suicidal ideation with limited intensity, and duration, some specificity in terms of plans, no associated intent, good self-control, limited dysphoria/symptomatology, some risk factors present, and identifiable protective factors, including available and accessible social support. 44 Y/o male who states he was at long term recovery program. He claims he was expected to work seven days a week. He asked to be allowed to work 6. He was told that if he did not want to work the seven days he was welcome to leave. He states he did not think twice about it, he left without a plan of what to do. Later on a job he thought he had did not come trough. He was homeless, with no work. He relapsed on alcohol. States he got increasingly more depressed and he tried to hang himself. PLAN OF CARE: Supportive approach/coping skills                               Alcohol dependence; will continue detox                               Depression; reassess for the use of an antidepressant                               Suicidal ideas; continue to monitor, he is willing to contract for safety at this time Will explore other possible residential treatment options  Will work a relapse prevention plan Will work with CBT/mindfulness Medical Decision Making:  Review of Psycho-Social Stressors (1), Review or order clinical lab tests (1), Review of Medication Regimen & Side Effects (2) and Review of New Medication or Change in Dosage (2)  I certify that inpatient services furnished can reasonably be expected to improve the patient's condition.   Alisabeth Selkirk A 12/27/2014, 6:10 PM

## 2014-12-27 NOTE — BH Assessment (Addendum)
Tele Assessment Note   [name] Donovan is a 44 y.o., homeless, African-American male presenting under IVC from [facility] due to SI and recent suicide attempt via attempted hanging with a belt. Pt reports that the belt broke but that he continues to experience SI, but without any current plan or intention. Pt presents with fair eye-contact, depressed mood and affect, linear thought process without evidence of delusion, and coherent and relevant speech pattern. He is well-oriented and does not appear to be responding to any internal stimuli. Pt is reportedly not med-compliant and has begun drinking again after 6 months of sobriety. Pt endorses sx including racing thoughts, fatigue, feelings of hopelessness, insomnia, feelings of worthlessness, anhedonia, social isolation, increased irritability, and suicidal ideations. Pt reports several current stressors and possible triggers for his recent suicide attempt, including being kicked out of his former SA tx center about a month ago -- which has resulted in a lot of self-blame and guilt and homelessness, financial stressors, and lack of support system. He says that he typically sleeps in the woods at night. Pt reports that he began drinking around age 89 following the death of his father. Pt denies any other hx of trauma or abuse. He denies any SA besides his etoh abuse.  Pt has 2 prior suicide attempts, including his recent attempted hanging with a belt and an overdose on Xanax "many years ago".  Pt denies HI, A/VH, or a hx of any prior hospitalizations besides [facility] once in 03/2014 for etoh abuse. Pt also received care from [facility] in 2015-16 but, as mentioned above, was kicked out of the program, which led to his recent relapse and homelessness.  Per [name] SievertSpencer Simon, PA, pt meets inpt criteria.    Axis I: 296.33 Major depressive disorder, Recurrent episode, Severe, by hx           303.90 Alcohol use disorder, Moderate  Axis II: No diagnosis Axis III:   Past Medical History  Diagnosis Date  . Hypertension   . Depression    Axis IV: economic problems, housing problems, occupational problems, other psychosocial or environmental problems, problems with access to health care services and problems with primary support group Axis V: 31-40 impairment in reality testing  Past Medical History:  Past Medical History  Diagnosis Date  . Hypertension   . Depression     Past Surgical History  Procedure Laterality Date  . Wrist surgery      Family History:  Family History  Problem Relation Age of Onset  . Hypertension Mother   . Heart disease Father     Rare heart disease  . Heart disease Brother     Rare heart disease (died)    Social History:  reports that he has been smoking Cigarettes.  He has been smoking about 0.20 packs per day. He has never used smokeless tobacco. He reports that he drinks about 0.6 oz of alcohol per week. He reports that he does not use illicit drugs.  Additional Social History:  Alcohol / Drug Use Pain Medications: See PTA List Prescriptions: See PTA List Over the Counter: See PTA List History of alcohol / drug use?: Yes Longest period of sobriety (when/how long): 1.5 years (Long time ago); Pt was also sober for 6 months while in rehab in 2015-16 and relapsed 2 months ago Negative Consequences of Use: Personal relationships, Surveyor, quantityinancial, Work / School Withdrawal Symptoms:  (Pt denies any w/d Sx) Substance #1 Name of Substance 1: Etoh (beer & liquor) 1 - Age  of First Use: 13 1 - Amount (size/oz): "A lot" - pt did not disclose certain amount 1 - Frequency: 3-4x per week 1 - Duration: 30 years; Relapsed 2 months ago after 6 yrs sobriety though 1 - Last Use / Amount: 12/24/14  CIWA: CIWA-Ar BP: (!) 170/104 mmHg Pulse Rate: 67 COWS:    PATIENT STRENGTHS: (choose at least two) Ability for insight Average or above average intelligence Communication skills Motivation for treatment/growth  Allergies:   Allergies  Allergen Reactions  . Penicillins Hives, Shortness Of Breath and Rash    dizziness  . Lipitor [Atorvastatin]     Cramping all over body   . Lisinopril Swelling    ? angioedema    Home Medications:  (Not in a hospital admission)  OB/GYN Status:  No LMP for male patient.  General Assessment Data Location of Assessment: [facility] Is this a Tele or Face-to-Face Assessment?: Face-to-Face Is this an Initial Assessment or a Re-assessment for this encounter?: Initial Assessment Marital status: Single Is patient pregnant?: No (Pt is male) Pregnancy Status: No Living Arrangements: Other (Comment) (Homeless) Can pt return to current living arrangement?: Yes Admission Status: Involuntary Is patient capable of signing voluntary admission?: No Referral Source: Other ([facility]ed by [facility]) Insurance type: None (Self-Pay)     Crisis Care Plan Living Arrangements: Other (Comment) (Homeless) [facility]iatrist[facility] Name of Therapist: [facility]  Education Status Is patient currently in school?: No Current Grade: na Highest grade of school patient has completed: 12 Name of school: na Contact person: na  Risk to self with the past 6 months Suicidal Ideation: Yes-Currently Present Has patient been a risk to self within the past 6 months prior to admission? : Yes Suicidal Intent: No Has patient had any suicidal intent within the past 6 months prior to admission? : Yes Is patient at risk for suicide?: Yes Suicidal Plan?: No-Not Currently/Within Last 6 Months (Tried to hang self with belt 2 days ago) Has patient had any suicidal plan within the past 6 months prior to admission? : Yes Access to Means: Yes Specify Access to Suicidal Means: Rope, belt (any means of strangulation); Medications for ODing What has been your use of drugs/alcohol within the last 12 months?: Etoh use at least 3-4x a week Previous Attempts/Gestures: Yes How many times?: 2 Other Self Harm Risks: Etoh  abuse, recent suicidality Triggers for Past Attempts: Other personal contacts, Other (Comment) (Kick out of SA tx, Loss of R/t w/ kids' mom, No social suprt) Intentional Self Injurious Behavior: None Family Suicide History: Unknown Recent stressful life event(s): Loss (Comment), Financial Problems, Other (Comment) (Kick out of SA tx, Loss of R/t w/ kids' mom, No social suprt) Persecutory voices/beliefs?: No Depression: Yes Depression Symptoms: Despondent, Insomnia, Isolating, Fatigue, Guilt, Loss of interest in usual pleasures, Feeling worthless/self pity, Feeling angry/irritable Substance abuse history and/or treatment for substance abuse?: Yes Suicide prevention information given to non-admitted patients: Not applicable  Risk to Others within the past 6 months Homicidal Ideation: No Does patient have any lifetime risk of violence toward others beyond the six months prior to admission? : No Thoughts of Harm to Others: No Current Homicidal Intent: No Current Homicidal Plan: No Access to Homicidal Means: No History of harm to others?: No Assessment of Violence: None Noted Violent Behavior Description: Pt calm and cooperative Does patient have access to weapons?: No Criminal Charges Pending?: No Does patient have a court date: No Is patient on probation?: No  Psychosis Hallucinations: None noted Delusions: None  noted  Mental Status Report Appearance/Hygiene: In scrubs Eye Contact: Fair Motor Activity: Freedom of movement Speech: Logical/coherent Level of Consciousness: Quiet/awake Mood: Depressed Affect: Sad Anxiety Level: Minimal Thought Processes: Coherent, Relevant Judgement: Partial Orientation: Person, Place, Time, Situation Obsessive Compulsive Thoughts/Behaviors: None  Cognitive Functioning Concentration: Good Memory: Recent Intact, Remote Intact IQ: Average Insight: Fair Impulse Control: Fair Appetite: Poor Weight Loss: 20 (1-2 months) Weight Gain: 0 Sleep:  Decreased Total Hours of Sleep: 3 Vegetative Symptoms: Staying in bed, Decreased grooming  ADLScreening [facility]) Patient's cognitive ability adequate to safely complete daily activities?: Yes Patient able to express need for assistance with ADLs?: Yes Independently performs ADLs?: Yes (appropriate for developmental age)  Prior Inpatient Therapy Prior Inpatient Therapy: Yes Prior Therapy Dates: 03/2014; 2015-16 Prior Therapy [facility]ilty/Provider([facility]): [facility] Reason for Treatment: Etoh Abuse, Depression  Prior Outpatient Therapy Prior Outpatient Therapy: Yes Prior Therapy Dates: 2000's Prior Therapy Facilty/Provider(s): [facility] Reason for Treatment: Depression, SA Does patient have an ACCT team?: No Does patient have Intensive In-House Services?  : No Does patient have [facility] services? : No Does patient have P4CC services?: No  ADL Screening (condition at time of admission) Patient's cognitive ability adequate to safely complete daily activities?: Yes Is the patient deaf or have difficulty hearing?: No Does the patient have difficulty seeing, even when wearing glasses/contacts?: No Does the patient have difficulty concentrating, remembering, or making decisions?: No Patient able to express need for assistance with ADLs?: Yes Does the patient have difficulty dressing or bathing?: No Independently performs ADLs?: Yes (appropriate for developmental age) Does the patient have difficulty walking or climbing stairs?: No Weakness of Legs: None Weakness of Arms/Hands: None  Home Assistive Devices/Equipment Home Assistive Devices/Equipment: None    Abuse/Neglect Assessment (Assessment to be complete while patient is alone) Physical Abuse: Denies Verbal Abuse: Denies Sexual Abuse: Denies Exploitation of patient/patient's resources: Denies Self-Neglect: Denies Values / Beliefs Cultural Requests During Hospitalization: None Spiritual Requests During  Hospitalization: None   Advance Directives (For Healthcare) Does patient have an advance directive?: No Would patient like information on creating an advanced directive?: No - patient declined information    Additional Information 1:1 In Past 12 Months?: No CIRT Risk: No Elopement Risk: No Does patient have medical clearance?: Yes     Disposition: Per [name] Sievert, PA, pt meets inpt criteria.   Disposition Initial Assessment Completed for this Encounter: Yes Disposition of Patient: Inpatient treatment program Type of inpatient treatment program: Adult  Cyndie Mull, [facility] Triage Specialist  12/27/2014 12:51 AM

## 2014-12-27 NOTE — Progress Notes (Deleted)
Recreation Therapy Notes  Date: 05.04.2016 Time: 9:30am Location: [address] Dayroom   Group Topic: Stress Management  Goal Area(s) Addresses:  Patient will actively participate in stress management techniques presented during session.   Behavioral Response: Did not attend.   Marykay Lexenise L Santina Trillo, LRT/CTRS  Jearl KlinefelterBlanchfield, Anette Barra L 12/27/2014 12:45 PM

## 2014-12-27 NOTE — H&P (Signed)
Psychiatric Admission Assessment Adult  Patient Identification: [name] Donovan MRN:  [id number] Date of Evaluation:  12/27/2014 Chief Complaint:  MDD RECURRENT Principal Diagnosis: Substance induced mood disorder Diagnosis:   Patient Active Problem List   Diagnosis Date Noted  . Substance induced mood disorder [F19.94] 12/27/2014  . Chest pain [R07.9] 11/05/2014  . HTN (hypertension) [I10] 11/05/2014  . Pain in the chest [R07.9]   . History of Alcohol dependence with withdrawal [F10.230] 05/19/2014  . 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 Suicidal ideations [R45.851] 05/19/2014  . Alcohol dependence [F10.20] 04/24/2014  . Major depression [F32.2] 04/22/2014  . Accelerated hypertension [I10] 04/28/2013  . Obstructive sleep apnea [G47.33] 04/28/2013  . Loss of weight [R63.4] 04/28/2013  . Morbid obesity-BMI 50 [E66.01] 04/28/2013  . OSA (obstructive sleep apnea) [G47.33] 04/28/2013   History of Present Illness: [name] Donovan is a 44 y.o., homeless, African-American male presenting under IVC from [facility] due to [place] and recent suicide attempt via attempted hanging with a belt. Pt reports that the belt broke but that he continues to experience SI, but without any current plan or intention. Pt presents with fair eye-contact, depressed mood and affect, linear thought process without evidence of delusion, and coherent and relevant speech pattern. He is well-oriented and does not appear to be responding to any internal stimuli. Pt is reportedly not med-compliant and has begun drinking again after 6 months of sobriety. Pt endorses sx including racing thoughts, fatigue, feelings of hopelessness, insomnia, feelings of worthlessness, anhedonia, social isolation, increased irritability, and suicidal ideations. Pt reports several current stressors and possible triggers for his recent suicide attempt, including being kicked out of his former SA tx center about a month ago --  which has resulted in a lot of self-blame and guilt and homelessness, financial stressors, and lack of support system. He says that he typically sleeps in the woods at night. Pt reports that he began drinking around age 44 following the death of his father. Pt denies any other hx of trauma or abuse. He denies any SA besides his etoh abuse.  Pt has 2 prior suicide attempts, including his recent attempted hanging with a belt and an overdose on Xanax "many years ago". Pt denies HI, A/VH, or a hx of any prior hospitalizations besides [facility] once in 03/2014 for etoh abuse. Pt also received care from [facility] in 2015-16 but, as mentioned above, was kicked out of the program, which led to his recent relapse and homelessness.  Patient was seen today.  He was calm and  Pleasant.  He states there were no specific triggers that made him have suicidal ideation and attempt by hanging.  Although, he reported homelssness and unemployment as major stressors in his life.  He states he was at [facility] just recently, also at [facility] for 6 month treatment and at [facility] less than a year ago.  He had only been on one antidepressant, Zoloft but only took medication for 2 weeks.    Elements:  Location:  Depression. Quality:  felt hopeless, worthless. Severity:  severe. Timing:  a long time. Duration:  chronic. Context:  per HPI. Associated Signs/Symptoms: Depression Symptoms:  depressed mood, hopelessness, suicidal attempt, (Hypo) Manic Symptoms:  Labiality of Mood, Anxiety Symptoms:  Social Anxiety, Psychotic Symptoms:  NA PTSD Symptoms: NA Total Time spent with patient: 30 minutes  Past Medical History:  Past Medical History  Diagnosis Date  . Hypertension   . Depression     Past Surgical History  Procedure Laterality Date  . Wrist surgery     Family History:  Family History  Problem Relation Age of Onset  . Hypertension Mother   . Heart disease Father     Rare heart disease  . Heart disease  Brother     Rare heart disease (died)   Social History:  History  Alcohol Use  . 0.6 oz/week  . 0 Standard drinks or equivalent, 1 Cans of beer per week     History  Drug Use No    History   Social History  . Marital Status: Single    Spouse Name: N/A  . Number of Children: N/A  . Years of Education: N/A   Social History Main Topics  . Smoking status: Light Tobacco Smoker -- 0.20 packs/day    Types: Cigarettes  . Smokeless tobacco: Never Used  . Alcohol Use: 0.6 oz/week    0 Standard drinks or equivalent, 1 Cans of beer per week  . Drug Use: No  . Sexual Activity: Not on file   Other Topics Concern  . 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   Additional Social History:  Musculoskeletal: Strength & Muscle Tone: within normal limits Gait & Station: normal Patient leans: N/A  Psychiatric Specialty Exam: Physical Exam  Vitals reviewed.   Review of Systems  Psychiatric/Behavioral: Positive for depression. The patient is nervous/anxious.     Blood pressure 160/93, pulse 78, temperature 98.3 F (36.8 C), temperature source Oral, resp. rate 20, height 5' 9.5" (1.765 m), weight 159.666 kg (352 lb).Body mass index is 51.25 kg/(m^2).  General Appearance: Neat  Eye Contact::  Good  Speech:  Normal Rate  Volume:  Normal  Mood:  Depressed  Affect:  Depressed  Thought Process:  Goal Directed  Orientation:  Full (Time, Place, and Person)  Thought Content:  Rumination  Suicidal Thoughts:  No  Homicidal Thoughts:  No  Memory:  Immediate;   Fair Recent;   Fair Remote;   Fair  Judgement:  Fair  Insight:  Fair  Psychomotor Activity:  Normal  Concentration:  Good  Recall:  Good  Fund of Knowledge:Good  Language: Negative  Akathisia:  Negative  Handed:  Right  AIMS (if indicated):     Assets:  Desire for Improvement Resilience Social Support  ADL's:  Intact  Cognition: WNL  Sleep:      Risk to Self: Is patient at risk  for suicide?: Yes Risk to Others:   Prior Inpatient Therapy:   Prior Outpatient Therapy:    Alcohol Screening: 1. How often do you have a drink containing alcohol?: 2 to 3 times a week 2. How many drinks containing alcohol do you have on a typical day when you are drinking?: 7, 8, or 9 3. How often do you have six or more drinks on one occasion?: Weekly Preliminary Score: 6 4. How often during the last year have you found that you were not able to stop drinking once you had started?: Weekly 5. How often during the last year have you failed to do what was normally expected from you becasue of drinking?: Less than monthly 6. How often during the last year have you needed a first drink in the morning to get yourself going after a heavy drinking session?: Less than monthly 7. How often during the last year have you had a feeling of guilt of remorse after drinking?: Weekly 8. How often during the last year  have you been unable to remember what happened the night before because you had been drinking?: Monthly 9. Have you or someone else been injured as a result of your drinking?: No 10. Has a relative or friend or a doctor or another health worker been concerned about your drinking or suggested you cut down?: Yes, during the last year Alcohol Use Disorder Identification Test Final Score (AUDIT): 23 Brief Intervention: Yes (MD notified)  Allergies:   Allergies  Allergen Reactions  . Penicillins Hives, Shortness Of Breath and Rash    dizziness  . Lipitor [Atorvastatin]     Cramping all over body   . Lisinopril Swelling    ? angioedema   Lab Results:  Results for orders placed or performed during the hospital encounter of 12/26/14 (from the past 48 hour(s))  Acetaminophen level     Status: Abnormal   Collection Time: 12/26/14  7:30 PM  Result Value Ref Range   Acetaminophen (Tylenol), Serum <10 (L) 10 - 30 ug/mL    Comment:        THERAPEUTIC CONCENTRATIONS VARY SIGNIFICANTLY. A RANGE OF  10-30 ug/mL MAY BE AN EFFECTIVE CONCENTRATION FOR MANY PATIENTS. HOWEVER, SOME ARE BEST TREATED AT CONCENTRATIONS OUTSIDE THIS RANGE. ACETAMINOPHEN CONCENTRATIONS >150 ug/mL AT 4 HOURS AFTER INGESTION AND >50 ug/mL AT 12 HOURS AFTER INGESTION ARE OFTEN ASSOCIATED WITH TOXIC REACTIONS.   Ethanol (ETOH)     Status: None   Collection Time: 12/26/14  7:30 PM  Result Value Ref Range   Alcohol, Ethyl (B) <5 <5 mg/dL    Comment:        LOWEST DETECTABLE LIMIT FOR SERUM ALCOHOL IS 11 mg/dL FOR MEDICAL PURPOSES ONLY   Salicylate level     Status: None   Collection Time: 12/26/14  7:30 PM  Result Value Ref Range   Salicylate Lvl <1.1 2.8 - 30.0 mg/dL  CBC     Status: Abnormal   Collection Time: 12/26/14  7:31 PM  Result Value Ref Range   WBC 9.8 4.0 - 10.5 K/uL   RBC 6.20 (H) 4.22 - 5.81 MIL/uL   Hemoglobin 14.8 13.0 - 17.0 g/dL   HCT 45.3 39.0 - 52.0 %   MCV 73.1 (L) 78.0 - 100.0 fL   MCH 23.9 (L) 26.0 - 34.0 pg   MCHC 32.7 30.0 - 36.0 g/dL   RDW 15.6 (H) 11.5 - 15.5 %   Platelets 243 150 - 400 K/uL  Comprehensive metabolic panel     Status: Abnormal   Collection Time: 12/26/14  7:31 PM  Result Value Ref Range   Sodium 141 135 - 145 mmol/L   Potassium 4.2 3.5 - 5.1 mmol/L   Chloride 105 101 - 111 mmol/L   CO2 28 22 - 32 mmol/L   Glucose, Bld 166 (H) 70 - 99 mg/dL   BUN 9 6 - 20 mg/dL   Creatinine, Ser 1.20 0.61 - 1.24 mg/dL   Calcium 8.9 8.9 - 10.3 mg/dL   Total Protein 7.6 6.5 - 8.1 g/dL   Albumin 4.0 3.5 - 5.0 g/dL   AST 34 15 - 41 U/L   ALT 29 17 - 63 U/L   Alkaline Phosphatase 85 38 - 126 U/L   Total Bilirubin 0.6 0.3 - 1.2 mg/dL   GFR calc non Af Amer >60 >60 mL/min   GFR calc Af Amer >60 >60 mL/min    Comment: (NOTE) The eGFR has been calculated using the CKD EPI equation. This calculation has not been validated in  all clinical situations. eGFR's persistently <90 mL/min signify possible Chronic Kidney Disease.    Anion gap 8 5 - 15  Urine Drug Screen      Status: None   Collection Time: 12/26/14  8:34 PM  Result Value Ref Range   Opiates NONE DETECTED NONE DETECTED   Cocaine NONE DETECTED NONE DETECTED   Benzodiazepines NONE DETECTED NONE DETECTED   Amphetamines NONE DETECTED NONE DETECTED   Tetrahydrocannabinol NONE DETECTED NONE DETECTED   Barbiturates NONE DETECTED NONE DETECTED    Comment:        DRUG SCREEN FOR MEDICAL PURPOSES ONLY.  IF CONFIRMATION IS NEEDED FOR ANY PURPOSE, NOTIFY LAB WITHIN 5 DAYS.        LOWEST DETECTABLE LIMITS FOR URINE DRUG SCREEN Drug Class       Cutoff (ng/mL) Amphetamine      1000 Barbiturate      200 Benzodiazepine   889 Tricyclics       169 Opiates          300 Cocaine          300 THC              50    Current Medications: Current Facility-Administered Medications  Medication Dose Route Frequency Provider Last Rate Last Dose  . [START ON 12/28/2014] amLODipine (NORVASC) tablet 5 mg  5 mg Oral Daily Delfin Gant, NP      . Derrill Memo ON 12/28/2014] hydrochlorothiazide (HYDRODIURIL) tablet 25 mg  25 mg Oral Daily Delfin Gant, NP      . hydrOXYzine (ATARAX/VISTARIL) tablet 25 mg  25 mg Oral Q6H PRN Kerrie Buffalo, NP      . loperamide (IMODIUM) capsule 2-4 mg  2-4 mg Oral PRN Kerrie Buffalo, NP      . LORazepam (ATIVAN) tablet 1 mg  1 mg Oral Q6H PRN Kerrie Buffalo, NP      . LORazepam (ATIVAN) tablet 1 mg  1 mg Oral QID Kerrie Buffalo, NP   1 mg at 12/27/14 1659   Followed by  . [START ON 12/28/2014] LORazepam (ATIVAN) tablet 1 mg  1 mg Oral TID Kerrie Buffalo, NP       Followed by  . [START ON 12/29/2014] LORazepam (ATIVAN) tablet 1 mg  1 mg Oral BID Kerrie Buffalo, NP       Followed by  . [START ON 12/30/2014] LORazepam (ATIVAN) tablet 1 mg  1 mg Oral Daily Kerrie Buffalo, NP      . metoprolol tartrate (LOPRESSOR) tablet 25 mg  25 mg Oral BID Delfin Gant, NP   25 mg at 12/27/14 1659  . multivitamin with minerals tablet 1 tablet  1 tablet Oral Daily Kerrie Buffalo, NP   1 tablet at  12/27/14 1659  . ondansetron (ZOFRAN-ODT) disintegrating tablet 4 mg  4 mg Oral Q6H PRN Kerrie Buffalo, NP      . thiamine (B-1) injection 100 mg  100 mg Intravenous Daily Delfin Gant, NP   100 mg at 12/27/14 1845  . [START ON 12/28/2014] thiamine (VITAMIN B-1) tablet 100 mg  100 mg Oral Daily Kerrie Buffalo, NP       PTA Medications: Prescriptions prior to admission  Medication Sig Dispense Refill Last Dose  . amLODipine (NORVASC) 5 MG tablet Take 1 tablet (5 mg total) by mouth daily. 30 tablet 2 Past Week at Unknown time  . hydrochlorothiazide (HYDRODIURIL) 25 MG tablet Take 1 tablet (25 mg total) by mouth daily. 30 tablet 2  Past Week at Unknown time  . HYDROcodone-acetaminophen (NORCO/VICODIN) 5-325 MG per tablet Take 1 tablet by mouth every 6 (six) hours as needed for moderate pain or severe pain. (Patient not taking: Reported on 11/05/2014) 6 tablet 0 Not Taking at Unknown time  . ibuprofen (ADVIL,MOTRIN) 200 MG tablet Take 600-800 mg by mouth every 8 (eight) hours as needed for moderate pain.   Past Month at Unknown time  . metoprolol tartrate (LOPRESSOR) 25 MG tablet Take 1 tablet (25 mg total) by mouth 2 (two) times daily. 60 tablet 2 Past Week at Unknown time  . nitroGLYCERIN (NITROSTAT) 0.4 MG SL tablet Place 1 tablet (0.4 mg total) under the tongue every 5 (five) minutes x 3 doses as needed for chest pain. 15 tablet 3 unknown at unknown  . rosuvastatin (CRESTOR) 20 MG tablet Take 1 tablet (20 mg total) by mouth daily at 6 PM. (Patient not taking: Reported on 12/26/2014) 30 tablet 0   . sertraline (ZOLOFT) 50 MG tablet Take 1 tablet (50 mg total) by mouth daily. For depression (Patient not taking: Reported on 11/05/2014) 30 tablet 0 Not Taking at Unknown time  . traZODone (DESYREL) 50 MG tablet Take 1 tablet (50 mg total) by mouth at bedtime. Sleep (Patient not taking: Reported on 11/05/2014) 30 tablet 0 Not Taking at Unknown time    Previous Psychotropic Medications: Yes   Substance  Abuse History in the last 12 months:  Yes.    Consequences of Substance Abuse: crisis events  Results for orders placed or performed during the hospital encounter of 12/26/14 (from the past 72 hour(s))  Acetaminophen level     Status: Abnormal   Collection Time: 12/26/14  7:30 PM  Result Value Ref Range   Acetaminophen (Tylenol), Serum <10 (L) 10 - 30 ug/mL    Comment:        THERAPEUTIC CONCENTRATIONS VARY SIGNIFICANTLY. A RANGE OF 10-30 ug/mL MAY BE AN EFFECTIVE CONCENTRATION FOR MANY PATIENTS. HOWEVER, SOME ARE BEST TREATED AT CONCENTRATIONS OUTSIDE THIS RANGE. ACETAMINOPHEN CONCENTRATIONS >150 ug/mL AT 4 HOURS AFTER INGESTION AND >50 ug/mL AT 12 HOURS AFTER INGESTION ARE OFTEN ASSOCIATED WITH TOXIC REACTIONS.   Ethanol (ETOH)     Status: None   Collection Time: 12/26/14  7:30 PM  Result Value Ref Range   Alcohol, Ethyl (B) <5 <5 mg/dL    Comment:        LOWEST DETECTABLE LIMIT FOR SERUM ALCOHOL IS 11 mg/dL FOR MEDICAL PURPOSES ONLY   Salicylate level     Status: None   Collection Time: 12/26/14  7:30 PM  Result Value Ref Range   Salicylate Lvl <4.0 2.8 - 30.0 mg/dL  CBC     Status: Abnormal   Collection Time: 12/26/14  7:31 PM  Result Value Ref Range   WBC 9.8 4.0 - 10.5 K/uL   RBC 6.20 (H) 4.22 - 5.81 MIL/uL   Hemoglobin 14.8 13.0 - 17.0 g/dL   HCT 45.3 39.0 - 52.0 %   MCV 73.1 (L) 78.0 - 100.0 fL   MCH 23.9 (L) 26.0 - 34.0 pg   MCHC 32.7 30.0 - 36.0 g/dL   RDW 15.6 (H) 11.5 - 15.5 %   Platelets 243 150 - 400 K/uL  Comprehensive metabolic panel     Status: Abnormal   Collection Time: 12/26/14  7:31 PM  Result Value Ref Range   Sodium 141 135 - 145 mmol/L   Potassium 4.2 3.5 - 5.1 mmol/L   Chloride 105 101 - 111 mmol/L  CO2 28 22 - 32 mmol/L   Glucose, Bld 166 (H) 70 - 99 mg/dL   BUN 9 6 - 20 mg/dL   Creatinine, Ser 1.20 0.61 - 1.24 mg/dL   Calcium 8.9 8.9 - 10.3 mg/dL   Total Protein 7.6 6.5 - 8.1 g/dL   Albumin 4.0 3.5 - 5.0 g/dL   AST 34 15 - 41  U/L   ALT 29 17 - 63 U/L   Alkaline Phosphatase 85 38 - 126 U/L   Total Bilirubin 0.6 0.3 - 1.2 mg/dL   GFR calc non Af Amer >60 >60 mL/min   GFR calc Af Amer >60 >60 mL/min    Comment: (NOTE) The eGFR has been calculated using the CKD EPI equation. This calculation has not been validated in all clinical situations. eGFR's persistently <90 mL/min signify possible Chronic Kidney Disease.    Anion gap 8 5 - 15  Urine Drug Screen     Status: None   Collection Time: 12/26/14  8:34 PM  Result Value Ref Range   Opiates NONE DETECTED NONE DETECTED   Cocaine NONE DETECTED NONE DETECTED   Benzodiazepines NONE DETECTED NONE DETECTED   Amphetamines NONE DETECTED NONE DETECTED   Tetrahydrocannabinol NONE DETECTED NONE DETECTED   Barbiturates NONE DETECTED NONE DETECTED    Comment:        DRUG SCREEN FOR MEDICAL PURPOSES ONLY.  IF CONFIRMATION IS NEEDED FOR ANY PURPOSE, NOTIFY LAB WITHIN 5 DAYS.        LOWEST DETECTABLE LIMITS FOR URINE DRUG SCREEN Drug Class       Cutoff (ng/mL) Amphetamine      1000 Barbiturate      200 Benzodiazepine   893 Tricyclics       734 Opiates          300 Cocaine          300 THC              50     Observation Level/Precautions:  15 minute checks  Laboratory:  per ED  Psychotherapy:  Group  Medications:  As per medlist  Consultations:  As needed  Discharge Concerns:  Safety  Estimated LOS:  2-7 days  Other:     Psychological Evaluations: Yes   Treatment Plan Summary: Admit for crisis management and mood stabilization. Medication management to re-stabilize current mood symptoms Group counseling sessions for coping skills Medical consults as needed Review and reinstate any pertinent home medications for other health problems   Medical Decision Making:  Review of Psycho-Social Stressors (1), Discuss test with performing physician (1), Decision to obtain old records (1), Review and summation of old records (2), Independent Review of image,  tracing or specimen (2) and Review of Medication Regimen & Side Effects (2)  I certify that inpatient services furnished can reasonably be expected to improve the patient's condition.   Freda Munro May Agustin AGNP-BC 5/4/20165:59 PM I personally assessed the patient, reviewed the physical exam and labs and formulated the treatment plan Geralyn Flash A. Sabra Heck, M.D.

## 2014-12-27 NOTE — Tx Team (Signed)
Initial Interdisciplinary Treatment Plan   PATIENT STRESSORS: Financial difficulties Health problems Medication change or noncompliance Substance abuse   PATIENT STRENGTHS: Ability for insight Average or above average intelligence Communication skills General fund of knowledge   PROBLEM LIST: Problem List/Patient Goals Date to be addressed Date deferred Reason deferred Estimated date of resolution  "I want to stop feeling helpless." 12/27/14     "I need to develop a plan for when I get out of here." 12/27/14           Depression 12/27/14     SI 12/27/14     ETOH abuse 12/27/14                        DISCHARGE CRITERIA:  Ability to meet basic life and health needs Improved stabilization in mood, thinking, and/or behavior Medical problems require only outpatient monitoring Need for constant or close observation no longer present Reduction of life-threatening or endangering symptoms to within safe limits Verbal commitment to aftercare and medication compliance Withdrawal symptoms are absent or subacute and managed without 24-hour nursing intervention  PRELIMINARY DISCHARGE PLAN: Attend aftercare/continuing care group Attend 12-step recovery group Outpatient therapy  PATIENT/FAMIILY INVOLVEMENT: This treatment plan has been presented to and reviewed with the patient, Ledora BottcherChristopher Kue, and/or family member.  The patient and family have been given the opportunity to ask questions and make suggestions.  Lawrence MarseillesFriedman, Haneen Bernales Eakes 12/27/2014, 2:39 PM
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
## 2014-12-27 NOTE — ED Notes (Signed)
Patient appears flat. Reports SI. Denies HI, AVH. Rates feelings of depression 10/10. No anxiety reported at present. Denies pain, has superficial scratches on his face.  Encouragement offered. Oriented to the unit.  Q 15 safety checks in place.

## 2014-12-27 NOTE — [facility] Group Notes (Signed)
[facility] LCSW Group Therapy  12/27/2014 2:26 PM  Type of Therapy:  Group Therapy  Participation Level:  Did Not Attend-pt new to unit. Chose to remain in bed.   Summary of Progress/Problems: Today's Topic: Overcoming Obstacles. Patients identified one short term goal and potential obstacles in reaching this goal. Patients processed barriers involved in overcoming these obstacles. Patients identified steps necessary for overcoming these obstacles and explored motivation (internal and external) for facing these difficulties head on.   Smart, Mikaele Stecher LCSWA  12/27/2014, 2:26 PM

## 2014-12-27 NOTE — Progress Notes (Signed)
Patient invol admitted via [facility] for SI to hang himself and alcohol abuse. Patient reports increasing depression since being kicked out of his substance abuse treatment facility and has relapsed, drinking "as much as I can get" 2-3 times a week. (Pt unable to be more specific, alcohol screening tool = 23.) Patient now homeless and has been sleeping in the woods. Non compliant with both psychiatric meds as well as antihypertensives. Patient has began drinking at age 44 when his father died. Patient here once prior in August 2015. PMH includes HTN and BP elevated in the ED and [facility]. CIWA is a "3". Patient oriented to unit, provided meal and fluids. Level III obs initiated. Patient flat, depressed and hopeless. Expressing shame and remorse for his failures. He denies SI at present and contracts verbally for safety should that change. Denies HI/AVH. Patient safe, resting in bed. [name] Donovan, [name] Donovan

## 2014-12-27 NOTE — [facility] Counselor (Addendum)
Per Donell SievertSpencer Simon, PA, pt meets inpt criteria and [facility].   Per Bunnie Pionori, AC, pt will receive a bed assignment and [facility] as soon as BP has stabilized and 1st opinion is completed.     Cyndie MullAnna Heidee Audi, [facility] Triage Specialist

## 2014-12-28 MED ORDER — TRAZODONE HCL 50 MG PO TABS
50.0000 mg | ORAL_TABLET | Freq: Every evening | ORAL | Status: DC | PRN
  Administered 2014-12-28 – 2014-12-31 (×4): 50 mg via ORAL
  Filled 2014-12-28 (×4): qty 1

## 2014-12-28 MED ORDER — BUPROPION HCL ER (XL) 150 MG PO TB24
150.0000 mg | ORAL_TABLET | Freq: Every day | ORAL | Status: DC
  Administered 2014-12-28 – 2014-12-29 (×2): 150 mg via ORAL
  Filled 2014-12-28 (×3): qty 1

## 2014-12-28 MED ORDER — ACAMPROSATE CALCIUM 333 MG PO TBEC
666.0000 mg | DELAYED_RELEASE_TABLET | Freq: Three times a day (TID) | ORAL | Status: DC
  Administered 2014-12-28 – 2015-01-04 (×20): 666 mg via ORAL
  Filled 2014-12-28 (×23): qty 2

## 2014-12-28 NOTE — Clinical Social Work Note (Addendum)
CSW met with pt individually. Pt requested [facility] and [facility] referrals. [facility] referral sent. CSW spoke with Hoyle Sauer at [facility] to get medical record number assigned to pt. She will process this and have Merry Proud call back to complete prescreen.   Maxie Better, LCSWA Clinical Social Worker 12/28/2014 3:41 PM    Melissa denied Yolanda due to his sleep apnea.   Maxie Better, LCSWA Clinical Social Worker 12/28/2014 3:44 PM

## 2014-12-28 NOTE — Progress Notes (Signed)
[facility] MD Progress Note  12/28/2014 1:34 PM Keston Seever  MRN:  [id number] Subjective:  Ithan continues to endorse that he is having a hard time. Admits he feels hopeless, helpless. He has regrets for having left the program he was in but admits he was not getting much help with his addiction and  he could not have continued to work 7 days a week getting up at 4 AM, He states the depression is getting worst. In the past he took Zoloft but states he never felt "right" on it. He is willing to take another antidepressant. He is also worried about the alcohol cravings. States he really needs to get into another treatment program Principal Problem: Substance induced mood disorder Diagnosis:   Patient Active Problem List   Diagnosis Date Noted  . Substance induced mood disorder [F19.94] 12/27/2014  . Chest pain [R07.9] 11/05/2014  . HTN (hypertension) [I10] 11/05/2014  . Pain in the chest [R07.9]   . History of Alcohol dependence with withdrawal [F10.230] 05/19/2014  . 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 Suicidal ideations [R45.851] 05/19/2014  . Alcohol dependence [F10.20] 04/24/2014  . Major depression [F32.2] 04/22/2014  . Accelerated hypertension [I10] 04/28/2013  . Obstructive sleep apnea [G47.33] 04/28/2013  . Loss of weight [R63.4] 04/28/2013  . Morbid obesity-BMI 50 [E66.01] 04/28/2013  . OSA (obstructive sleep apnea) [G47.33] 04/28/2013   Total Time spent with patient: 30 minutes   Past Medical History:  Past Medical History  Diagnosis Date  . Hypertension   . Depression     Past Surgical History  Procedure Laterality Date  . Wrist surgery     Family History:  Family History  Problem Relation Age of Onset  . Hypertension Mother   . Heart disease Father     Rare heart disease  . Heart disease Brother     Rare heart disease (died)   Social History:  History  Alcohol Use  . 0.6 oz/week  . 0 Standard drinks or equivalent, 1 Cans of  beer per week     History  Drug Use No    History   Social History  . Marital Status: Single    Spouse Name: N/A  . Number of Children: N/A  . Years of Education: N/A   Social History Main Topics  . Smoking status: Light Tobacco Smoker -- 0.20 packs/day    Types: Cigarettes  . Smokeless tobacco: Never Used  . Alcohol Use: 0.6 oz/week    0 Standard drinks or equivalent, 1 Cans of beer per week  . Drug Use: No  . Sexual Activity: Not on file   Other Topics Concern  . None   Social History Narrative   Sister is a family medicine doctor in [place].   Additional History:    Sleep: Poor, does not feel rested  Appetite:  Poor   Assessment:   Musculoskeletal: Strength & Muscle Tone: within normal limits Gait & Station: normal Patient leans: N/A   Psychiatric Specialty Exam: Physical Exam  Review of Systems  Constitutional: Positive for malaise/fatigue.  Eyes: Positive for blurred vision.  Respiratory: Negative.   Cardiovascular: Negative.   Gastrointestinal: Positive for nausea and diarrhea.  Genitourinary: Negative.   Musculoskeletal: Positive for joint pain.  Skin: Negative.   Neurological: Positive for dizziness, weakness and headaches.  Endo/Heme/Allergies: Negative.   Psychiatric/Behavioral: Positive for depression and substance abuse. The patient is nervous/anxious and has insomnia.     Blood pressure 167/69, pulse 63, temperature 98.1 F (  36.7 C), temperature source Oral, resp. rate 16, height 5' 9.5" (1.765 m), weight 159.666 kg (352 lb).Body mass index is 51.25 kg/(m^2).  General Appearance: Disheveled  Eye Sport and exercise psychologist::  Fair  Speech:  Clear and Coherent, Slow and not spontaneous  Volume:  Decreased  Mood:  Anxious and Depressed  Affect:  Depressed and Tearful  Thought Process:  Coherent and Goal Directed  Orientation:  Full (Time, Place, and Person)  Thought Content:  symptoms events worries concerns  Suicidal Thoughts:  No  Homicidal Thoughts:  No   Memory:  Immediate;   Fair Recent;   Fair Remote;   Fair  Judgement:  Fair  Insight:  Present and Shallow  Psychomotor Activity:  Decreased  Concentration:  Poor  Recall:  [employer] of Knowledge:Fair  Language: Fair  Akathisia:  No  Handed:  Right  AIMS (if indicated):     Assets:  Desire for Improvement  ADL's:  Intact  Cognition: WNL  Sleep:  Number of Hours: 6.75     Current Medications: Current Facility-Administered Medications  Medication Dose Route Frequency Provider Last Rate Last Dose  . amLODipine (NORVASC) tablet 5 mg  5 mg Oral Daily Delfin Gant, NP   5 mg at 12/28/14 0741  . hydrochlorothiazide (HYDRODIURIL) tablet 25 mg  25 mg Oral Daily Delfin Gant, NP   25 mg at 12/28/14 0741  . hydrOXYzine (ATARAX/VISTARIL) tablet 25 mg  25 mg Oral Q6H PRN Kerrie Buffalo, NP      . loperamide (IMODIUM) capsule 2-4 mg  2-4 mg Oral PRN Kerrie Buffalo, NP      . LORazepam (ATIVAN) tablet 1 mg  1 mg Oral Q6H PRN Kerrie Buffalo, NP      . LORazepam (ATIVAN) tablet 1 mg  1 mg Oral TID Kerrie Buffalo, NP   1 mg at 12/28/14 1317   Followed by  . [START ON 12/29/2014] LORazepam (ATIVAN) tablet 1 mg  1 mg Oral BID Kerrie Buffalo, NP       Followed by  . [START ON 12/30/2014] LORazepam (ATIVAN) tablet 1 mg  1 mg Oral Daily Kerrie Buffalo, NP      . metoprolol tartrate (LOPRESSOR) tablet 25 mg  25 mg Oral BID Delfin Gant, NP   25 mg at 12/28/14 0741  . multivitamin with minerals tablet 1 tablet  1 tablet Oral Daily Kerrie Buffalo, NP   1 tablet at 12/28/14 0741  . ondansetron (ZOFRAN-ODT) disintegrating tablet 4 mg  4 mg Oral Q6H PRN Kerrie Buffalo, NP      . thiamine (B-1) injection 100 mg  100 mg Intravenous Daily Delfin Gant, NP   100 mg at 12/27/14 1845  . thiamine (VITAMIN B-1) tablet 100 mg  100 mg Oral Daily Kerrie Buffalo, NP   100 mg at 12/28/14 0350    Lab Results:  Results for orders placed or performed during the hospital encounter of 12/26/14 (from  the past 48 hour(s))  Acetaminophen level     Status: Abnormal   Collection Time: 12/26/14  7:30 PM  Result Value Ref Range   Acetaminophen (Tylenol), Serum <10 (L) 10 - 30 ug/mL    Comment:        THERAPEUTIC CONCENTRATIONS VARY SIGNIFICANTLY. A RANGE OF 10-30 ug/mL MAY BE AN EFFECTIVE CONCENTRATION FOR MANY PATIENTS. HOWEVER, SOME ARE BEST TREATED AT CONCENTRATIONS OUTSIDE THIS RANGE. ACETAMINOPHEN CONCENTRATIONS >150 ug/mL AT 4 HOURS AFTER INGESTION AND >50 ug/mL AT 12 HOURS AFTER INGESTION ARE OFTEN ASSOCIATED  WITH TOXIC REACTIONS.   Ethanol (ETOH)     Status: None   Collection Time: 12/26/14  7:30 PM  Result Value Ref Range   Alcohol, Ethyl (B) <5 <5 mg/dL    Comment:        LOWEST DETECTABLE LIMIT FOR SERUM ALCOHOL IS 11 mg/dL FOR MEDICAL PURPOSES ONLY   Salicylate level     Status: None   Collection Time: 12/26/14  7:30 PM  Result Value Ref Range   Salicylate Lvl <0.7 2.8 - 30.0 mg/dL  CBC     Status: Abnormal   Collection Time: 12/26/14  7:31 PM  Result Value Ref Range   WBC 9.8 4.0 - 10.5 K/uL   RBC 6.20 (H) 4.22 - 5.81 MIL/uL   Hemoglobin 14.8 13.0 - 17.0 g/dL   HCT 45.3 39.0 - 52.0 %   MCV 73.1 (L) 78.0 - 100.0 fL   MCH 23.9 (L) 26.0 - 34.0 pg   MCHC 32.7 30.0 - 36.0 g/dL   RDW 15.6 (H) 11.5 - 15.5 %   Platelets 243 150 - 400 K/uL  Comprehensive metabolic panel     Status: Abnormal   Collection Time: 12/26/14  7:31 PM  Result Value Ref Range   Sodium 141 135 - 145 mmol/L   Potassium 4.2 3.5 - 5.1 mmol/L   Chloride 105 101 - 111 mmol/L   CO2 28 22 - 32 mmol/L   Glucose, Bld 166 (H) 70 - 99 mg/dL   BUN 9 6 - 20 mg/dL   Creatinine, Ser 1.20 0.61 - 1.24 mg/dL   Calcium 8.9 8.9 - 10.3 mg/dL   Total Protein 7.6 6.5 - 8.1 g/dL   Albumin 4.0 3.5 - 5.0 g/dL   AST 34 15 - 41 U/L   ALT 29 17 - 63 U/L   Alkaline Phosphatase 85 38 - 126 U/L   Total Bilirubin 0.6 0.3 - 1.2 mg/dL   GFR calc non Af Amer >60 >60 mL/min   GFR calc Af Amer >60 >60 mL/min     Comment: (NOTE) The eGFR has been calculated using the CKD EPI equation. This calculation has not been validated in all clinical situations. eGFR's persistently <90 mL/min signify possible Chronic Kidney Disease.    Anion gap 8 5 - 15  Urine Drug Screen     Status: None   Collection Time: 12/26/14  8:34 PM  Result Value Ref Range   Opiates NONE DETECTED NONE DETECTED   Cocaine NONE DETECTED NONE DETECTED   Benzodiazepines NONE DETECTED NONE DETECTED   Amphetamines NONE DETECTED NONE DETECTED   Tetrahydrocannabinol NONE DETECTED NONE DETECTED   Barbiturates NONE DETECTED NONE DETECTED    Comment:        DRUG SCREEN FOR MEDICAL PURPOSES ONLY.  IF CONFIRMATION IS NEEDED FOR ANY PURPOSE, NOTIFY LAB WITHIN 5 DAYS.        LOWEST DETECTABLE LIMITS FOR URINE DRUG SCREEN Drug Class       Cutoff (ng/mL) Amphetamine      1000 Barbiturate      200 Benzodiazepine   371 Tricyclics       062 Opiates          300 Cocaine          300 THC              50     Physical Findings: AIMS: Facial and Oral Movements Muscles of Facial Expression: None, normal Lips and Perioral Area: None, normal Jaw: None, normal Tongue:  None, normal,Extremity Movements Upper (arms, wrists, hands, fingers): None, normal Lower (legs, knees, ankles, toes): None, normal, Trunk Movements Neck, shoulders, hips: None, normal, Overall Severity Severity of abnormal movements (highest score from questions above): None, normal Incapacitation due to abnormal movements: None, normal Patient's awareness of abnormal movements (rate only patient's report): No Awareness, Dental Status Current problems with teeth and/or dentures?: No Does patient usually wear dentures?: No  CIWA:  CIWA-Ar Total: 6 COWS:     Treatment Plan Summary: Daily contact with patient to assess and evaluate symptoms and progress in treatment and Medication management Supportive approach/coping skills Alcohol dependence; continue the detox  protocol/work a relapse prevention plan Alcohol cravings; will start the Campral 333 mg two TID Depression ; will start Wellbutrin XL 150 mg in AM and reassess Will explore residential treatment options  Medical Decision Making:  Review of Psycho-Social Stressors (1), Review of Medication Regimen & Side Effects (2) and Review of New Medication or Change in Dosage (2)     Mahir Prabhakar A 12/28/2014, 1:34 PM

## 2014-12-28 NOTE — Progress Notes (Signed)
Patient states that he is "detoxing hard" today. His positives were that he gout up for group so feels that things are looking up.

## 2014-12-28 NOTE — [facility] Suicide Risk Assessment (Signed)
[facility] INPATIENT:  Family/Significant Other Suicide Prevention Education  Suicide Prevention Education:  Education Completed; Blair Promiseawana Goodman (pt's ex-girlfriend) [phone] has been identified by the patient as the family member/significant other with whom the patient will be residing, and identified as the person(s) who will aid the patient in the event of a mental health crisis (suicidal ideations/suicide attempt).  With written consent from the patient, the family member/significant other has been provided the following suicide prevention education, prior to the and/or following the discharge of the patient.  The suicide prevention education provided includes the following:  Suicide risk factors  Suicide prevention and interventions  National Suicide Hotline telephone number  [facility] assessment telephone number  [facility] Emergency Assistance 911  [facility] and/or Residential Mobile Crisis Unit telephone number  Request made of family/significant other to:  Remove weapons (e.g., guns, rifles, knives), all items previously/currently identified as safety concern.    Remove drugs/medications (over-the-counter, prescriptions, illicit drugs), all items previously/currently identified as a safety concern.  The family member/significant other verbalizes understanding of the suicide prevention education information provided.  The family member/significant other agrees to remove the items of safety concern listed above.  Smart, Krizia Flight LCSWA  12/28/2014, 4:12 PM

## 2014-12-28 NOTE — Progress Notes (Signed)
[name] is seen OOB UAL on the unit today. HE spends most of his time lying in his bed.   A He does not want to complete his daily assessment and says " I don't  Feel good" when questioned about it by this nurse. He does, however, contract with this nurse for safety . He is started  on po wellbutrin and po campral and he is educated about the need for these medications as well. He denies withdrawal symptoms .   R Safety is in

## 2014-12-28 NOTE — Tx Team (Signed)
Interdisciplinary Treatment Plan Update (Adult)  Date:  12/28/2014  Time Reviewed:  8:49 AM   Progress in Treatment: Attending groups: Yes. Participating in groups:  Yes. Taking medication as prescribed:  Yes. Tolerating medication:  Yes. Family/Significant othe contact made:   Patient understands diagnosis:  Yes. and As evidenced by:  seeking treatment for SI with attempt, depression, medication stabilization, and for ETOH detox.  Discussing patient identified problems/goals with staff:  Yes. Medical problems stabilized or resolved:  Yes. Denies suicidal/homicidal ideation: Yes. Issues/concerns per patient self-inventory:  Other:  New problem(s) identified:    Discharge Plan or Barriers: CSW assessing for appropriate referrals. Pt is currently homeless and had [facility].  He is [facility] and [facility]. CSW assessing.   Reason for Continuation of Hospitalization: Depression Medication stabilization Withdrawal symptoms  Comments:   Ledora BottcherChristopher Cadenhead is a 44 y.o., homeless, African-American male presenting under [facility] due to SI and recent suicide attempt via attempted hanging with a belt. Pt reports that the belt broke but that he continues to experience SI, but without any current plan or intention. Pt is reportedly not med-compliant and has begun drinking again after 6 months of sobriety. Pt endorses sx including racing thoughts, fatigue, feelings of hopelessness, insomnia, feelings of worthlessness, anhedonia, social isolation, increased irritability, and suicidal ideations. Pt reports several current stressors and possible triggers for his recent suicide attempt, including being kicked out of his former SA tx center about a month ago -- which has resulted in a lot of self-blame and guilt and homelessness, financial stressors, and lack of support system. He says that he typically sleeps in the woods at night. Pt  reports that he began drinking around age 44 following the death of his father. Pt denies any other hx of trauma or abuse. He denies any SA besides his etoh abuse.Pt has 2 prior suicide attempts, including his recent attempted hanging with a belt and an overdose on Xanax "many years ago". Pt denies HI, A/VH, or a hx of any prior hospitalizations besides [facility] once in 03/2014 for etoh abuse. Pt also received care from [facility] in 2015-16 but, as mentioned above, was kicked out of the program, which led to his recent relapse and homelessness.  Estimated length of stay:  3-5 days   New goal(s):   Additional Comments:  Patient and CSW reviewed pt's identified goals and treatment plan. Patient verbalized understanding and agreed to treatment plan. [facility] reviewed [facility] "Discharge Process and Patient Involvement" Form. Pt verbalized understanding of information provided and signed form.   Attendees: Patient:   12/28/2014 8:49 AM   Family:   12/28/2014 8:49 AM   Physician:  Dr. Geoffery LyonsIrving Lugo, MD 12/28/2014 8:49 AM   Nursing:   Laverle PatterBritney T RN; Patty   12/28/2014 8:49 AM   Clinical Social Worker: Trula SladeHeather Smart, LCSWA  12/28/2014 8:49 AM   Clinical Social Worker: Belenda CruiseKristin Drinkard LCSWA 12/28/2014 8:49 AM   Other:  Darden DatesJennifer C. Nurse Case Manager 12/28/2014 8:49 AM   Other:  Leisa LenzValerie Enoch; [facility]  12/28/2014 8:49 AM   Other:   12/28/2014 8:49 AM   Other:  12/28/2014 8:49 AM   Other:  12/28/2014 8:49 AM   Other:  12/28/2014 8:49 AM    12/28/2014 8:49 AM    12/28/2014 8:49 AM    12/28/2014 8:49 AM    12/28/2014 8:49 AM    Scribe for Treatment Team:   Trula SladeHeather Smart, LCSWA  12/28/2014 8:49 AM

## 2014-12-28 NOTE — Progress Notes (Signed)
D. Pt had been up and visible minimally in milieu, limited interaction or participation. Pt reported depression and on-going withdrawal symptoms and did appear tremulous and did complain of feeling tired this evening. Pt received medication without incident and reported he wanted to get some rest and have a good night sleep. A. Support and encouragement provided. R. Safety maintained, will continue to monitor.

## 2014-12-28 NOTE — [facility] Group Notes (Signed)
[place] LCSW Group Therapy  12/28/2014 11:19 AM  Type of Therapy:  Group Therapy  Participation Level:  Did Not Attend-pt was meeting with MD during group. CSW met with pt individually after group.   Summary of Progress/Problems: Group members were encouraged to identify triggers, warning signs and coping skills used when facing the possibility of relapse. Social supports were discussed and explored in detail. Post Acute Withdrawal Syndrome (handout provided) was introduced and examined. Pt's were encouraged to ask questions, talk about key points associated with PAWS, and process this information in terms of relapse prevention.    Smart, Amaura Authier LCSWA  12/28/2014, 11:19 AM

## 2014-12-28 NOTE — Progress Notes (Signed)
NUTRITION ASSESSMENT  Pt identified as at risk on the Malnutrition Screen Tool  INTERVENTION: 1. Educated patient on the importance of nutrition and encouraged intake of food and beverages. 2. Discussed weight goals. 3. Supplements: none  NUTRITION DIAGNOSIS: Unintentional weight loss related to sub-optimal intake as evidenced by pt report.   Goal: Pt to meet >/= 90% of their estimated nutrition needs.  Monitor:  PO intake  Assessment:  Pt seen for MST. Pt states that he is feeling nauseous today and so he did not eat much. He states that this was going on PTA as well and was a constant but worse after eating. He states that for 1-2 weeks PTA he would eat dinner every other day with no other intakes throughout those days. He reports that he believes he lost 30 lbs in the past month related to depression, suicidal feelings.  44 y.o. male  Height: Ht Readings from Last 1 Encounters:  12/27/14 5' 9.5" (1.765 m)    Weight: Wt Readings from Last 1 Encounters:  12/27/14 352 lb (159.666 kg)    Weight Hx: Wt Readings from Last 10 Encounters:  12/27/14 352 lb (159.666 kg)  11/16/14 373 lb (169.192 kg)  11/07/14 366 lb 12.8 oz (166.379 kg)  08/31/14 363 lb 8 oz (164.883 kg)  04/22/14 325 lb (147.419 kg)  04/22/14 295 lb (133.811 kg)  06/03/13 298 lb (135.172 kg)  04/28/13 298 lb (135.172 kg)    BMI:  Body mass index is 51.25 kg/(m^2). Pt meets criteria for morbid obesity based on current BMI.  Estimated Nutritional Needs: Kcal: 25-30 kcal/kg Protein: > 1 gram protein/kg Fluid: 1 ml/kcal  Diet Order: Diet regular Room service appropriate?: Yes; Fluid consistency:: Thin Pt is also offered choice of unit snacks mid-morning and mid-afternoon.  Pt is eating as desired.   Lab results and medications reviewed.   Trenton GammonJessica Kassie Keng, RD, LDN Inpatient Clinical Dietitian Pager # [phone] After hours/weekend pager # [phone]

## 2014-12-29 MED ORDER — BUPROPION HCL ER (XL) 300 MG PO TB24
300.0000 mg | ORAL_TABLET | Freq: Every day | ORAL | Status: DC
  Administered 2014-12-30 – 2015-01-03 (×5): 300 mg via ORAL
  Filled 2014-12-29 (×7): qty 1

## 2014-12-29 NOTE — Tx Team (Signed)
Interdisciplinary Treatment Plan Update (Adult)  Date:  12/29/2014  Time Reviewed:  2:53 PM   Progress in Treatment: Attending groups: Patient is attending groups. Participating in groups:  Patient engages in discussion Taking medication as prescribed:  Patient is taking medications Tolerating medication:  Patient is tolerating medications Family/Significant othe contact made:   Yes, collateral contact with ex-girlfriend. Patient understands diagnosis:Yes, patient understands diagnosis and need for treatment Discussing patient identified problems/goals with staff:  Yes, patient is able to express goals/problems Medical problems stabilized or resolved:  Yes Denies suicidal/homicidal ideation: Yes, patient is denying SI/HI. Issues/concerns per patient self-inventory:   Other:   Discharge Plan or Barriers:  Referral [facility] and [facility] for [facility].  [facility]  Reason for Continuation of Hospitalization: Anxiety Depression Medication stabilization Detox  Comments:   [name] BottcherChristopher Donovan is a 44 y.o., homeless, African-American male presenting [facility] SI and recent suicide attempt via attempted hanging with a belt. Pt reports that the belt broke but that he continues to experience SI, but without any current plan or intention. Pt presents with fair eye-contact, depressed mood and affect, linear thought process without evidence of delusion, and coherent and relevant speech pattern. He is well-oriented and does not appear to be responding to any internal stimuli. Pt is reportedly not med-compliant and has begun drinking again after 6 months of sobriety. Pt endorses sx including racing thoughts, fatigue, feelings of hopelessness, insomnia, feelings of worthlessness, anhedonia, social isolation, increased irritability, and suicidal ideations. Pt reports several current stressors and possible triggers for his recent suicide attempt, including  being kicked out of his former SA tx center about a month ago -- which has resulted in a lot of self-blame and guilt and homelessness, financial stressors, and lack of support system. He says that he typically sleeps in the woods at night. Pt reports that he began drinking around age 44 following the death of his father. Pt denies any other hx of trauma or abuse. He denies any SA besides his etoh abuse.   Additional comments:  Patient and CSW reviewed Patient Discharge Process Letter/Patient Involvement Form.  Patient verbalized understanding and signed form.  Patient and CSW also reviewed and identified patient's goals and treatment plan.  Patient verbalized understanding and agreed to plan.  Estimated length of stay:  3-5 days  New goal(s):  Review of initial/current patient goals per problem list:  See treatment plan   Attendees: Patient 12/29/2014 2:53 PM   Family:   12/29/2014 2:53 PM   Physician:  Geoffery LyonsIrving Lugo, MD 12/29/2014 2:53 PM   Nursing:   Liborio NixonPatrice White, RN 12/29/2014 2:53 PM   Clinical Social Worker:  Juline PatchQuylle Adlene Adduci, LCSW 12/29/2014 2:53 PM   Clinical Social Worker:  Santa GeneraAnne Cunningham, LCSW/Lead SW  12/29/2014 2:53 PM   Case Manager:  Onnie BoerJennifer Clark, RN 12/29/2014 2:53 PM   Other:  Natale MilchPatie Dukes, RN 12/29/2014 2:53 PM  Other:   12/29/2014  2:53 PM   Other:  12/29/2014 2:53 PM   Other:  12/29/2014 2:53 PM   Other:  12/29/2014 2:53 PM   Other:   12/29/2014 2:53 PM   Other:   12/29/2014 2:53 PM   Other:  12/29/2014 2:53 PM   Other:   12/29/2014 2:53 PM    Scribe for Treatment Team:   Wynn BankerHodnett, Jaydan Chretien Hairston, 12/29/2014   2:53 PM

## 2014-12-29 NOTE — Progress Notes (Signed)
Pt attended AA meeting.  

## 2014-12-29 NOTE — [facility] Group Notes (Signed)
[facility] LCSW Group Therapy  Feelings Around Relapse 1:15 -2:30                                                               12/29/2014 3:55 PM   Type of Therapy:  Group Therapy  Participation Level:  Appropriate  Participation Quality:  Appropriate  Affect:  Appropriate  Cognitive:  Attentive Appropriate  Insight:  Developing/Improving  Engagement in Therapy: Developing/Improving  Modes of Intervention:  Discussion Exploration Problem-Solving Supportive  Summary of Progress/Problems:  The topic for today was feelings around relapse.  Patients processed feelings toward relapse and was able to relate to peers.  Patient listened attentively but did not participate.  Santa GeneraAnne Karlye Ihrig, LCSW Clinical Social Worker

## 2014-12-29 NOTE — Clinical Social Work Note (Signed)
CSW met with patient who was agreeable to referral to [facility]. Clinicals faxed.  Awaiting response.  Message also left for Merry Proud at [facility] to contact CSW if he requires additional information.

## 2014-12-29 NOTE — Progress Notes (Signed)
[facility] MD Progress Note  12/29/2014 2:06 PM [name] BottcherChristopher Donovan  MRN:  [id number] Subjective:  [name] Donovan continues to endorse feeling depressed. " do not want to kill myself, but would rather be dead." He is still dealing with a lot of shame, regrets  and guilt for having left [facility], although rationally he states it was not working for him. He states he needs help to get out of "this hole." State he has never felt this bad before. Admits he is becoming hopeless  Principal Problem: Substance induced mood disorder Diagnosis:   Patient Active Problem List   Diagnosis Date Noted  . Substance induced mood disorder [F19.94] 12/27/2014  . Chest pain [R07.9] 11/05/2014  . HTN (hypertension) [I10] 11/05/2014  . Pain in the chest [R07.9]   . History of Alcohol dependence with withdrawal [F10.230] 05/19/2014  . 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 Suicidal ideations [R45.851] 05/19/2014  . Alcohol dependence [F10.20] 04/24/2014  . Major depression [F32.2] 04/22/2014  . Accelerated hypertension [I10] 04/28/2013  . Obstructive sleep apnea [G47.33] 04/28/2013  . Loss of weight [R63.4] 04/28/2013  . Morbid obesity-BMI 50 [E66.01] 04/28/2013  . OSA (obstructive sleep apnea) [G47.33] 04/28/2013   Total Time spent with patient: 30 minutes   Past Medical History:  Past Medical History  Diagnosis Date  . Hypertension   . Depression     Past Surgical History  Procedure Laterality Date  . Wrist surgery     Family History:  Family History  Problem Relation Age of Onset  . Hypertension Mother   . Heart disease Father     Rare heart disease  . Heart disease Brother     Rare heart disease (died)   Social History:  History  Alcohol Use  . 0.6 oz/week  . 0 Standard drinks or equivalent, 1 Cans of beer per week     History  Drug Use No    History   Social History  . Marital Status: Single    Spouse Name: N/A  . Number of Children: N/A  . Years of  Education: N/A   Social History Main Topics  . Smoking status: Light Tobacco Smoker -- 0.20 packs/day    Types: Cigarettes  . Smokeless tobacco: Never Used  . Alcohol Use: 0.6 oz/week    0 Standard drinks or equivalent, 1 Cans of beer per week  . Drug Use: No  . Sexual Activity: Not on file   Other Topics Concern  . 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   Additional History:    Sleep: Fair  Appetite:  Fair   Assessment:   Musculoskeletal: Strength & Muscle Tone: within normal limits Gait & Station: normal Patient leans: N/A   Psychiatric Specialty Exam: Physical Exam  Review of Systems  Constitutional: Positive for malaise/fatigue.  HENT: Negative.   Eyes: Negative.   Respiratory: Negative.   Cardiovascular: Negative.   Gastrointestinal: Negative.   Genitourinary: Negative.   Musculoskeletal: Negative.   Skin: Negative.   Neurological: Positive for weakness.  Endo/Heme/Allergies: Negative.   Psychiatric/Behavioral: Positive for depression and substance abuse. The patient is nervous/anxious.     Blood pressure 142/91, pulse 102, temperature 98.1 F (36.7 C), temperature source Oral, resp. rate 18, height 5' 9.5" (1.765 m), weight 159.666 kg (352 lb).Body mass index is 51.25 kg/(m^2).  General Appearance: Disheveled  Eye Contact::  Fair  Speech:  Clear and Coherent, Slow and not spontaneous  Volume:  Decreased  Mood:  Anxious and Depressed  Affect:  Depressed, Restricted and Tearful  Thought Process:  Coherent and Goal Directed  Orientation:  Full (Time, Place, and Person)  Thought Content:  symptoms events worries concerns  Suicidal Thoughts:  No  Homicidal Thoughts:  would rather be dead  Memory:  Immediate;   Fair Recent;   Fair Remote;   Fair  Judgement:  Fair  Insight:  Present and Shallow  Psychomotor Activity:  Decreased  Concentration:  Fair  Recall:  [employer] of Knowledge:Fair  Language: Fair   Akathisia:  No  Handed:  Right  AIMS (if indicated):     Assets:  Desire for Improvement  ADL's:  Intact  Cognition: WNL  Sleep:  Number of Hours: 6.75     Current Medications: Current Facility-Administered Medications  Medication Dose Route Frequency Provider Last Rate Last Dose  . acamprosate (CAMPRAL) tablet 666 mg  666 mg Oral TID WC Rachael Fee, MD   666 mg at 12/29/14 [phone]  . amLODipine (NORVASC) tablet 5 mg  5 mg Oral Daily Earney Navy, NP   5 mg at 12/29/14 0815  . buPROPion (WELLBUTRIN XL) 24 hr tablet 150 mg  150 mg Oral Daily Rachael Fee, MD   150 mg at 12/29/14 0815  . hydrochlorothiazide (HYDRODIURIL) tablet 25 mg  25 mg Oral Daily Earney Navy, NP   25 mg at 12/29/14 0815  . hydrOXYzine (ATARAX/VISTARIL) tablet 25 mg  25 mg Oral Q6H PRN Adonis Brook, NP   25 mg at 12/28/14 2229  . loperamide (IMODIUM) capsule 2-4 mg  2-4 mg Oral PRN Adonis Brook, NP      . LORazepam (ATIVAN) tablet 1 mg  1 mg Oral Q6H PRN Adonis Brook, NP      . LORazepam (ATIVAN) tablet 1 mg  1 mg Oral BID Adonis Brook, NP   1 mg at 12/29/14 0816   Followed by  . [START ON 12/30/2014] LORazepam (ATIVAN) tablet 1 mg  1 mg Oral Daily Adonis Brook, NP      . metoprolol tartrate (LOPRESSOR) tablet 25 mg  25 mg Oral BID Earney Navy, NP   25 mg at 12/29/14 0815  . multivitamin with minerals tablet 1 tablet  1 tablet Oral Daily Adonis Brook, NP   1 tablet at 12/29/14 0815  . ondansetron (ZOFRAN-ODT) disintegrating tablet 4 mg  4 mg Oral Q6H PRN Adonis Brook, NP      . thiamine (B-1) injection 100 mg  100 mg Intravenous Daily Earney Navy, NP   100 mg at 12/27/14 1845  . thiamine (VITAMIN B-1) tablet 100 mg  100 mg Oral Daily Adonis Brook, NP   100 mg at 12/29/14 0815  . traZODone (DESYREL) tablet 50 mg  50 mg Oral QHS PRN Worthy Flank, NP   50 mg at 12/28/14 2229    Lab Results: No results found for this or any previous visit (from the past 48  hour(s)).  Physical Findings: AIMS: Facial and Oral Movements Muscles of Facial Expression: None, normal Lips and Perioral Area: None, normal Jaw: None, normal Tongue: None, normal,Extremity Movements Upper (arms, wrists, hands, fingers): None, normal Lower (legs, knees, ankles, toes): None, normal, Trunk Movements Neck, shoulders, hips: None, normal, Overall Severity Severity of abnormal movements (highest score from questions above): None, normal Incapacitation due to abnormal movements: None, normal Patient's awareness of abnormal movements (rate only patient's report): No Awareness, Dental Status Current problems  with teeth and/or dentures?: No Does patient usually wear dentures?: No  CIWA:  CIWA-Ar Total: 2 COWS:     Treatment Plan Summary: Daily contact with patient to assess and evaluate symptoms and progress in treatment and Medication management Supportive approach/coping skills Alcohol Dependence; continue the Ativan detox protocol/work a relapse prevention plan Depression; will increase the Wellbutrin to XL 300 mg in AM/work with CBT; identify the cognitive distortions help to switch to better ways of perceiving his reality.swicth to more positive perceptions [facility] Work on identify possible placements  Medical Decision Making:  Review of Psycho-Social Stressors (1), Review or order clinical lab tests (1), Review of Medication Regimen & Side Effects (2) and Review of New Medication or Change in Dosage (2)     Lataya Varnell A 12/29/2014, 2:06 PM

## 2014-12-29 NOTE — Progress Notes (Signed)
[name] Donovan remains quiet, isolative and sleeping in his bed. HE avoids eye contact. His affect is flat, sad, depressed and quiet. He completes his daily assessment and on it he writes he has experienced  SI today....but when this nurse asks him if he can stay safe, he says " yeah...intercurrent illness'mnot going to do something like that".   A His Wellbutrin is increased today to 300 mg XL per MD order. HE is tolerating campral.   R Safety is in place and poc cont.

## 2014-12-29 NOTE — Progress Notes (Signed)
D: Pt presents anxious in affect and mood. Pt endorses feeling hopeless. Pt verbalizes that he has been actively trying to stay visible within the milieu. Writer noted to be present for group. Pt was also observed in the dayroom but with minimal interaction with others. Pt is currently negative for any SI/HIAVH. Pt reports having trouble sleeping. Writer contacted on-call extender for pt's insomnia.  A: Writer administered scheduled and prn medications to pt, per MD orders. Continued support and availability as needed was extended to this pt. Staff continue to monitor pt with q4615min checks.  R: No adverse drug reactions noted. Pt receptive to treatment. Pt remains safe at this time.

## 2014-12-30 ENCOUNTER — Ambulatory Visit (HOSPITAL_COMMUNITY): Admit: 2014-12-30 | Payer: Self-pay

## 2014-12-30 ENCOUNTER — Ambulatory Visit (HOSPITAL_COMMUNITY): Payer: Federal, State, Local not specified - Other

## 2014-12-30 DIAGNOSIS — M25432 Effusion, left wrist: Secondary | ICD-10-CM | POA: Diagnosis present

## 2014-12-30 MED ORDER — COLCHICINE 0.6 MG PO TABS
1.2000 mg | ORAL_TABLET | Freq: Once | ORAL | Status: AC
Start: 2014-12-30 — End: 2014-12-30
  Administered 2014-12-30: 1.2 mg via ORAL
  Filled 2014-12-30: qty 2

## 2014-12-30 MED ORDER — COLCHICINE 0.6 MG PO TABS
0.6000 mg | ORAL_TABLET | Freq: Every day | ORAL | Status: DC
  Administered 2014-12-30 – 2015-01-03 (×5): 0.6 mg via ORAL
  Filled 2014-12-30 (×6): qty 1

## 2014-12-30 MED ORDER — IBUPROFEN 400 MG PO TABS
400.0000 mg | ORAL_TABLET | Freq: Four times a day (QID) | ORAL | Status: DC | PRN
  Administered 2014-12-30 – 2015-01-02 (×9): 400 mg via ORAL
  Filled 2014-12-30 (×10): qty 1
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
## 2014-12-30 NOTE — Progress Notes (Signed)
Patient has been up but not interacting with other patients. He attended group this evening and reports having had a lousy day but did not elaborate on what was the cause. Writer encouraged him to talk about it but he chose not to. He reports passive si and verbally contracts for safety, denies hi/a/v hall. Support and encouragement offered, safety maintained on unit, will continue to monitor.

## 2014-12-30 NOTE — Progress Notes (Signed)
[name] Donovan is seen UAL on the 300 hall today. HE walks up to the med window , cradling his left hand as if his arm is in a sling and says " I'm  Worried about my left hand..it  hurts really bad".  He takes his morning meds as scheduled . He makes good eye contact and his speech is organized and thougth pattern clear.   A This nurse spoke with NP (LD  ) who assessed pt's left wrist . Left wrist xray  shows no acute process. Pt already started on colchicine.    R Safety in place.
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
## 2014-12-30 NOTE — Progress Notes (Signed)
Patient did attend the evening speaker AA meeting.  

## 2014-12-30 NOTE — Progress Notes (Signed)
Patient ID: [name] Donovan, male   DOB: 1971/03/19, 44 y.o.   MRN: [id number] Adult Psychoeducational Group Note  Date:  12/30/2014 Time: 02:00pm  Group Topic/Focus:  Making Healthy Choices:   The focus of this group is to help patients identify negative/unhealthy choices they were using prior to admission and identify positive/healthier coping strategies to replace them upon discharge.  Participation Level:  Did Not Attend  Participation Quality: n/a  Affect: n/a  Cognitive: n/a  Insight:n/a  Engagement in Group:  N/a  Modes of Intervention:  Activity, Discussion, Education and Support  Additional Comments:  Pt did not attend. Pt in bed asleep.   Aurora Maskwyman, Rozetta Stumpp E 12/30/2014, 3:51 PM

## 2014-12-30 NOTE — [facility] Group Notes (Signed)
[facility] Group Notes: (Clinical Social Work)   12/30/2014      Type of Therapy:  Group Therapy   Participation Level:  Did Not Attend despite MHT prompting   Ambrose MantleMareida Grossman-Orr, LCSW 12/30/2014, 4:48 PM

## 2014-12-31 LAB — URIC ACID: URIC ACID, SERUM: 8.5 mg/dL — AB (ref 4.4–7.6)

## 2014-12-31 MED ORDER — AMLODIPINE BESYLATE 10 MG PO TABS
10.0000 mg | ORAL_TABLET | Freq: Every day | ORAL | Status: DC
  Administered 2015-01-01 – 2015-01-04 (×4): 10 mg via ORAL
  Filled 2014-12-31 (×5): qty 1

## 2014-12-31 NOTE — Plan of Care (Signed)
Problem: Alteration in mood & ability to function due to Goal: STG-Patient will comply with prescribed medication regimen (Patient will comply with prescribed medication regimen)  Outcome: Progressing Patient is compliant with prescribed medications.

## 2014-12-31 NOTE — [facility] Group Notes (Addendum)
[facility] Group Notes:  (Clinical Social Work)  12/31/2014  10:00-11:00AM  Summary of Progress/Problems:   The main focus of today's process group was to   1)  discuss the importance of adding supports  2)  define health supports versus unhealthy supports  3)  identify the patient's current unhealthy supports and plan how to handle them  4)  Identify the patient's current healthy supports and plan what to add.  An emphasis was placed on using counselor, doctor, therapy groups, 12-step groups, and problem-specific support groups to expand supports.    The patient expressed full comprehension of the concepts presented, and agreed that there is a need to add more supports.  The patient arrived to group late and did not participate.   Type of Therapy:  Process Group with Motivational Interviewing  Participation Level:  None  Participation Quality:  Resistant  Affect:  Flat  Cognitive:  Lacking  Insight:  None  Engagement in Therapy:  None  Modes of Intervention:   Education, Teacher, English as a foreign languageupport and Processing, Activity  Leroy Pettway A. Patrick-Jefferson, LCSW 12/31/2014

## 2014-12-31 NOTE — Progress Notes (Signed)
Consult progress note                                           Patient Demographics  [name] Donovan, is a 44 y.o. male, DOB - [birth year], ZOX:[id number]  Admit date - 12/27/2014   Admitting Physician Rachael FeeIrving A Lugo, MD  Outpatient Primary MD for the patient is Doris CheadleADVANI, DEEPAK, MD  LOS - 4   No chief complaint on file.      Hospitalist team will sign off kindly call with questions.     Subjective:   [name] BottcherChristopher Monier today has, No headache, No chest pain, No abdominal pain - No Nausea, No new weakness tingling or numbness, No Cough - SOB. L.Wrist pain much improved  Assessment & Plan    1. Left wrist pain due to gouty arthritis. Much improved with colchicine continue, x-ray stable, he has chronic left wrist issues as well and his range of motion at baseline is limited. He is close to his baseline. Continue coaches in follow with PCP and primary orthopedics postdischarge.   2. Essential hypertension. Norvasc dose increased, continue present dose Lopressor and monitor.   3. Depression, substance abuse. Per primary team which is psychiatry.     Medications  Scheduled Meds: . acamprosate  666 mg Oral TID WC  . [START ON 01/01/2015] amLODipine  10 mg Oral Daily  . buPROPion  300 mg Oral Daily  . colchicine  0.6 mg Oral Daily  . metoprolol tartrate  25 mg Oral BID  . multivitamin with minerals  1 tablet Oral Daily  . thiamine  100 mg Intravenous Daily  . thiamine  100 mg Oral Daily   Continuous Infusions:  PRN Meds:.ibuprofen, traZODone     Lab Results  Component Value Date   PLT 243 12/26/2014    Antibiotics   Anti-infectives    None          Objective:   Filed Vitals:   12/30/14 2130 12/30/14 2131  12/31/14 0635 12/31/14 0636  BP: 122/82 126/86 140/93 137/95  Pulse: 74 79 79 84  Temp:   98.7 F (37.1 C)   TempSrc:   Oral   Resp:   16   Height:      Weight:        Wt Readings from Last 3 Encounters:  12/27/14 159.666 kg (352 lb)  11/16/14 169.192 kg (373 lb)  11/07/14 166.379 kg (366 lb 12.8 oz)    No intake or output data in the 24 hours ending 12/31/14 1022   Physical Exam  Awake Alert, Oriented X 3, No new F.N deficits, Normal affect .AT,PERRAL Supple Neck,No JVD, No cervical lymphadenopathy appriciated.  Symmetrical Chest wall movement, Good air movement bilaterally, CTAB  RRR,No Gallops,Rubs or new Murmurs, No Parasternal Heave +ve B.Sounds, Abd Soft, No tenderness, No organomegaly appriciated, No rebound - guarding or rigidity. No Cyanosis, Clubbing or edema, No new Rash or bruise L wrist minimal swelling, no warmth, no tenderness on passive ROM   Data Review   Micro Results No results found for this or any previous visit (from the past 240 hour(s)).  Radiology Reports Dg Wrist Complete Left  12/30/2014   CLINICAL DATA:  Pain and swelling for 2 days, no known injury, initial encounter  EXAM: LEFT WRIST - COMPLETE 3+ VIEW  COMPARISON:  None.  FINDINGS: No acute fracture or dislocation is noted. Mild narrowing in the radiocarpal joint is seen. No gross soft tissue abnormality is noted.  IMPRESSION: Mild degenerative change without acute abnormality.   Electronically Signed   By: Alcide CleverMark  Lukens M.D.   On: 12/30/2014 17:26     CBC  Recent Labs Lab 12/26/14 1931  WBC 9.8  HGB 14.8  HCT 45.3  PLT 243  MCV 73.1*  MCH 23.9*  MCHC 32.7  RDW 15.6*    Chemistries   Recent Labs Lab 12/26/14 1931  NA 141  K 4.2  CL 105  CO2 28  GLUCOSE 166*  BUN 9  CREATININE 1.20  CALCIUM 8.9  AST 34  ALT 29  ALKPHOS 85  BILITOT 0.6   ------------------------------------------------------------------------------------------------------------------ estimated  creatinine clearance is 120.1 mL/min (by C-G formula based on Cr of 1.2). ------------------------------------------------------------------------------------------------------------------ No results for input(s): HGBA1C in the last 72 hours. ------------------------------------------------------------------------------------------------------------------ No results for input(s): CHOL, HDL, LDLCALC, TRIG, CHOLHDL, LDLDIRECT in the last 72 hours. ------------------------------------------------------------------------------------------------------------------ No results for input(s): TSH, T4TOTAL, T3FREE, THYROIDAB in the last 72 hours.  Invalid input(s): FREET3 ------------------------------------------------------------------------------------------------------------------ No results for input(s): VITAMINB12, FOLATE, FERRITIN, TIBC, IRON, RETICCTPCT in the last 72 hours.  Coagulation profile No results for input(s): INR, PROTIME in the last 168 hours.  No results for input(s): DDIMER in the last 72 hours.  Cardiac Enzymes No results for input(s): CKMB, TROPONINI, MYOGLOBIN in the last 168 hours.  Invalid input(s): CK ------------------------------------------------------------------------------------------------------------------ Invalid input(s): POCBNP     Time Spent in minutes   15   Kaedyn Polivka K M.D on 12/31/2014 at 10:22 AM  Between 7am to 7pm - Pager - [phone]  After 7pm go to [email or web address] - password [email or web address]  [facility]   Office  [phone]

## 2014-12-31 NOTE — Progress Notes (Signed)
Did not attend group 

## 2014-12-31 NOTE — Progress Notes (Signed)
[name] is seen OOB UAL on the 300 hall this morning. HE smiles immediately when this nurse calls his name and he holds up his left wrist and says " its better today". Upon exam, he is noted to not be favoring left wrist as much as he did yesterday. HE is seen flexing and straightening his fingers on his left hand casually and fingers on left hand are not as puffy as they were yesterday.   A He says he slept better last night and that he's feeling better today. Internist Thedore Mins( Singh) in to see pt and uric acid scheduled , norvasc QD dose increased to  10 mg and these changes discussed with pt.    R Safety in place.

## 2014-12-31 NOTE — Progress Notes (Signed)
Writer spoke with patient 1:1 and he reports feeling relieved since having his hand x-rayed and knowing the possible cause. He attended AA group this evening and shortly after group he requested his medication which he received. He reports passive si and verbally contracts for safety, denies hi/a/v hallucinations. Patient's ice pack was refilled and motrin given. Support and encouragement given, safety maintained on unit with 15 min checks.

## 2014-12-31 NOTE — Progress Notes (Signed)
Patient ID: [name] Donovan, male   DOB: Jan 01, 1971, 44 y.o.   MRN: [id number] Patient ID: [name] Donovan, male   DOB: Jan 01, 1971, 44 y.o.   MRN: [id number] [facility] MD Progress Note  12/31/2014 3:16 PM [name] Donovan  MRN:  [id number]  Subjective: [name] Donovan says he is feeling a lot better today. However, says his mood is still up & day. He is hoping on getting in a substance abuse treatment center after discharge. He currently denies any new issues.   Objective: Patient seen and chart is reviewed. He was lying down in bed in his room. He says he is hopeful and will do his best to achieve sobriety from here on. He denies any new issues.  Problem: Substance induced mood disorder Diagnosis:   Patient Active Problem List   Diagnosis Date Noted  . Swelling of joint of left wrist [M25.432] 12/30/2014  . Substance induced mood disorder [F19.94] 12/27/2014  . Chest pain [R07.9] 11/05/2014  . HTN (hypertension) [I10] 11/05/2014  . Pain in the chest [R07.9]   . History of Alcohol dependence with withdrawal [F10.230] 05/19/2014  . 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 Suicidal ideations [R45.851] 05/19/2014  . Alcohol dependence [F10.20] 04/24/2014  . Major depression [F32.2] 04/22/2014  . Accelerated hypertension [I10] 04/28/2013  . Obstructive sleep apnea [G47.33] 04/28/2013  . Loss of weight [R63.4] 04/28/2013  . Morbid obesity-BMI 50 [E66.01] 04/28/2013  . OSA (obstructive sleep apnea) [G47.33] 04/28/2013   Total Time spent with patient: 30 minutes  Past Medical History:  Past Medical History  Diagnosis Date  . Hypertension   . Depression     Past Surgical History  Procedure Laterality Date  . Wrist surgery     Family History:  Family History  Problem Relation Age of Onset  . Hypertension Mother   . Heart disease Father     Rare heart disease  . Heart disease Brother     Rare heart disease (died)   Social History:  History  Alcohol Use  .  0.6 oz/week  . 0 Standard drinks or equivalent, 1 Cans of beer per week     History  Drug Use No    History   Social History  . Marital Status: Single    Spouse Name: N/A  . Number of Children: N/A  . Years of Education: N/A   Social History Main Topics  . Smoking status: Light Tobacco Smoker -- 0.20 packs/day    Types: Cigarettes  . Smokeless tobacco: Never Used  . Alcohol Use: 0.6 oz/week    0 Standard drinks or equivalent, 1 Cans of beer per week  . Drug Use: No  . Sexual Activity: Not on file   Other Topics Concern  . 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   Additional History:    Sleep: Fair  Appetite:  Fair  Assessment:   Musculoskeletal: Strength & Muscle Tone: within normal limits Gait & Station: normal Patient leans: N/A  Psychiatric Specialty Exam: Physical Exam  Psychiatric: His speech is normal. He is slowed and withdrawn. Cognition and memory are normal. He expresses impulsivity. He exhibits a depressed mood. He expresses suicidal ideation.    ROS  Blood pressure 137/95, pulse 84, temperature 98.7 F (37.1 C), temperature source Oral, resp. rate 16, height 5' 9.5" (1.765 m), weight 159.666 kg (352 lb).Body mass index is 51.25 kg/(m^2).  General Appearance: Disheveled  Eye Contact::  Fair  Speech:  Clear and Coherent, Slow and not spontaneous  Volume:  Decreased  Mood:  Depressed  Affect:  Restricted and Tearful  Thought Process:  Coherent and Goal Directed  Orientation:  Full (Time, Place, and Person)  Thought Content:  symptoms events worries concerns  Suicidal Thoughts:  No  Homicidal Thoughts:  No  Memory:  Immediate;   Fair Recent;   Fair Remote;   Fair  Judgement:  Fair  Insight:  Present and Shallow  Psychomotor Activity:  Decreased  Concentration:  Fair  Recall:  FiservFair  Fund of Knowledge:Fair  Language: Fair  Akathisia:  No  Handed:  Right  AIMS (if indicated):     Assets:   Communication Skills Desire for Improvement Resilience  ADL's:  Intact  Cognition: WNL  Sleep:  Number of Hours: 5.75   Current Medications: Current Facility-Administered Medications  Medication Dose Route Frequency Provider Last Rate Last Dose  . acamprosate (CAMPRAL) tablet 666 mg  666 mg Oral TID WC Rachael FeeIrving A Lugo, MD   666 mg at 12/31/14 0640  . [START ON 01/01/2015] amLODipine (NORVASC) tablet 10 mg  10 mg Oral Daily Leroy SeaPrashant K Singh, MD      . buPROPion (WELLBUTRIN XL) 24 hr tablet 300 mg  300 mg Oral Daily Rachael FeeIrving A Lugo, MD   300 mg at 12/31/14 0911  . colchicine tablet 0.6 mg  0.6 mg Oral Daily Leatha Gildingostin M Gherghe, MD   0.6 mg at 12/31/14 0911  . ibuprofen (ADVIL,MOTRIN) tablet 400 mg  400 mg Oral Q6H PRN Leatha Gildingostin M Gherghe, MD   400 mg at 12/31/14 1356  . metoprolol tartrate (LOPRESSOR) tablet 25 mg  25 mg Oral BID Earney NavyJosephine C Onuoha, NP   25 mg at 12/31/14 0911  . multivitamin with minerals tablet 1 tablet  1 tablet Oral Daily Adonis BrookSheila Agustin, NP   1 tablet at 12/31/14 0911  . thiamine (B-1) injection 100 mg  100 mg Intravenous Daily Earney NavyJosephine C Onuoha, NP   100 mg at 12/27/14 1845  . thiamine (VITAMIN B-1) tablet 100 mg  100 mg Oral Daily Adonis BrookSheila Agustin, NP   100 mg at 12/31/14 0911  . traZODone (DESYREL) tablet 50 mg  50 mg Oral QHS PRN Worthy FlankIjeoma E Nwaeze, NP   50 mg at 12/30/14 2145    Lab Results: No results found for this or any previous visit (from the past 48 hour(s)).  Physical Findings: AIMS: Facial and Oral Movements Muscles of Facial Expression: None, normal Lips and Perioral Area: None, normal Jaw: None, normal Tongue: None, normal,Extremity Movements Upper (arms, wrists, hands, fingers): None, normal Lower (legs, knees, ankles, toes): None, normal, Trunk Movements Neck, shoulders, hips: None, normal, Overall Severity Severity of abnormal movements (highest score from questions above): None, normal Incapacitation due to abnormal movements: None, normal Patient's awareness  of abnormal movements (rate only patient's report): No Awareness, Dental Status Current problems with teeth and/or dentures?: No Does patient usually wear dentures?: No  CIWA:  CIWA-Ar Total: 0 COWS:     Treatment Plan Summary: Daily contact with patient to assess and evaluate symptoms and progress in treatment and Medication management Supportive approach/coping skills Alcohol Dependence; has finished the Ativan detox protocol/work a relapse prevention plan Depression; will continue Wellbutrin to XL 300 mg in AM/work with CBT; identify the cognitive distortions help to switch to better ways of perceiving his reality and swtich to more positive perceptions Work on identify possible placements Consult Internal medicine for left hand pain/swelling   Medical Decision Making:  Established Problem, Stable/Improving (1), Review of Psycho-Social Stressors (1), Review or order clinical lab tests (1), Review of Medication Regimen & Side Effects (2) and Review of New Medication or Change in Dosage (2)  Sanjuana Kava, PMHNP-BC 12/31/2014, 3:16 PM   Agree with NP Progress Note  Nehemiah Massed MD

## 2015-01-01 DIAGNOSIS — G4700 Insomnia, unspecified: Secondary | ICD-10-CM

## 2015-01-01 DIAGNOSIS — F329 Major depressive disorder, single episode, unspecified: Principal | ICD-10-CM

## 2015-01-01 DIAGNOSIS — F1020 Alcohol dependence, uncomplicated: Secondary | ICD-10-CM

## 2015-01-01 MED ORDER — MIRTAZAPINE 30 MG PO TABS
30.0000 mg | ORAL_TABLET | Freq: Every day | ORAL | Status: DC
  Administered 2015-01-01: 30 mg via ORAL
  Filled 2015-01-01 (×4): qty 1

## 2015-01-01 NOTE — Progress Notes (Signed)
D: Patient in the dayroom watching television on approach.  Patient states he had a better day today.  Patient states his goal for today was to have a better day then he did yesterday.  Patient state states he did meet his goal for today.  Patient states he was more visible on unit today.  Patient denies SI/HI and denies AVH.  A: Staff to monitor Q 15 mins for safety.  Encouragement and support offered.  Scheduled medications administered per orders.   R: Patient remains safe on the unit.  Patient attended group tonight.  Patient visible on the unit.  Patient taking administered medications.

## 2015-01-01 NOTE — Progress Notes (Signed)
D:Pt presents with flat affect and depressed mood. Pt rates depression 6/10. Hopeless 5/10. Anxiety 10/10. Pt denies AVH. Pt endorses passive suicidal thoughts with no plan. Pt verbally contracts not to harm self. Pt has minimal interaction on the milieu and often isolates in his room. Pt appears disheveled and is wearing scrubs. Pt noted to be attending groups today and compliant with taking meds.  A: Medications administered as ordered per MD Verbal support given. Pt encouraged to attend groups. 15 minute checks performed for safety.  R: Pt safety maintained. Pt receptive to treatment.

## 2015-01-01 NOTE — Progress Notes (Signed)
Writer entered patient's room and observed him lying in bed reading the bible. He reported that he didn't know AA meeting had started. Writer observed that his left hand looked much better and the swelling was gone. He reports that he is uncertain as to where he will be going once discharged but is hopeful that one of [facility] and [facility] will go through. He reports that he still feels very depressed and is hoping to go to a treatment facility upon discharge because he feels that he will relapse. Support and encouragement given, safety maintained on unit with 15 min checks.

## 2015-01-01 NOTE — [facility] Group Notes (Signed)
[facility]   01/01/2015 9:25 AM  Participation Quality:  Appropriate   Mood/Affect:  Depressed and Flat  Depression Rating:  8  Anxiety Rating:  8  Thoughts of Suicide:  No Will you contract for safety?   NA  Current AVH:  No  Plan for Discharge/Comments:  Pt reports that he had a fair weekend. 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 [facility] and [facility] are pending. Pt reports continued depression and states "I'm trying to stay out of my room and I'm doing okay with that."   Transportation Means: unknown at this time.   Supports: limited supports. Mother of his kids is minimally supportive per pt.   Smart, American FinancialHeather LCSWA

## 2015-01-01 NOTE — Progress Notes (Signed)
Recreation Therapy Notes  Date: 05.09.2016 Time: 9:30am Location: 300 Hall Group Room   Group Topic: Stress Management  Goal Area(s) Addresses:  Patient will actively participate in stress management techniques presented during session.   Behavioral Response: Did not attend.   Marykay Lexenise L Orin Eberwein, LRT/CTRS        Osamah Schmader L 01/01/2015 3:30 PM

## 2015-01-01 NOTE — [facility] Group Notes (Signed)
[facility] LCSW Group Therapy  01/01/2015 10:54 AM  Type of Therapy:  Group Therapy  Participation Level:  Active  Participation Quality:  Attentive  Affect:  Appropriate  Cognitive:  Alert and Oriented  Insight:  Improving  Engagement in Therapy:  Improving  Modes of Intervention:  Confrontation, Discussion, Education, Exploration, Problem-solving, Rapport Building, Socialization and Support  Summary of Progress/Problems: Today's Topic: Overcoming Obstacles. Patients identified one short term goal and potential obstacles in reaching this goal. Patients processed barriers involved in overcoming these obstacles. Patients identified steps necessary for overcoming these obstacles and explored motivation (internal and external) for facing these difficulties head on. [name] Donovan was attentive and engaged during today's processing group. He shared that he is worried about being placed directly in tx facility. "I know I need that." [name] Donovan was encouraged to think about alternative options "I know I need to prepare for the worst and hope for the best." He stated that he is interested in calling [facility] and [name] [facility] to learn more about that halfway house. [name] Donovan shared his story of how he became addicted to alcohol after his father's death when he was [age] and how he has used alcohol to cope since then. [name] Donovan reported that he felt motivated to stop drinking and increase his support network through [employer] and other positive social outlets.    Smart, [name] Donovan LCSWA  01/01/2015, 10:54 AM

## 2015-01-01 NOTE — Progress Notes (Signed)
Attended wrap up group tonight. 

## 2015-01-01 NOTE — Clinical Social Work Note (Addendum)
CSW spoke with Formi at [facility] (admissions coordinator). No beds available today for males. [facility] due to medical issues/severity of MI symptoms. Referral [facility] pending. CSW left 3rd message for Trey PaulaJeff [facility] to call back in attempt to complete prescreen. Pt has been given [facility] list and halfway house information including [facility] information.  Lilliauna Van Smart, LCSWA  01/01/2015 10:11 AM   [facility] for Thursday 01/04/15. CSW still waiting for Trey PaulaJeff to call back to complete phone screening and possibly get straight admission date. MRN: [id number]  Trula SladeHeather Smart, LCSWA Clinical Social Worker 01/01/2015 12:34 PM

## 2015-01-01 NOTE — Progress Notes (Signed)
[facility] MD Progress Note  01/01/2015 5:53 PM [name] Donovan  MRN:  [id number] Subjective:  [name] Donovan experienced pain secondary to gout. The swelling is going down. He is still endorsing depression, he did not sleep well last night. States the Trazodone is not helping He is still worrying that if does not get a residential treatment program lined up he is going to relapse. States he does not feel strong enough to be able to remain abstinent without further help. Admits he does not trust himself. Still having a hard time going back and forth in terms of his leaving [facility]. Admits to shame and guilt. States he dreads relapsing. States he does not want to go trough the same thing over and over again.  Principal Problem: Substance induced mood disorder Diagnosis:   Patient Active Problem List   Diagnosis Date Noted  . Swelling of joint of left wrist [M25.432] 12/30/2014  . Substance induced mood disorder [F19.94] 12/27/2014  . Chest pain [R07.9] 11/05/2014  . HTN (hypertension) [I10] 11/05/2014  . Pain in the chest [R07.9]   . History of Alcohol dependence with withdrawal [F10.230] 05/19/2014  . 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 Suicidal ideations [R45.851] 05/19/2014  . Alcohol dependence [F10.20] 04/24/2014  . Major depression [F32.2] 04/22/2014  . Accelerated hypertension [I10] 04/28/2013  . Obstructive sleep apnea [G47.33] 04/28/2013  . Loss of weight [R63.4] 04/28/2013  . Morbid obesity-BMI 50 [E66.01] 04/28/2013  . OSA (obstructive sleep apnea) [G47.33] 04/28/2013   Total Time spent with patient: 30 minutes   Past Medical History:  Past Medical History  Diagnosis Date  . Hypertension   . Depression     Past Surgical History  Procedure Laterality Date  . Wrist surgery     Family History:  Family History  Problem Relation Age of Onset  . Hypertension Mother   . Heart disease Father     Rare heart disease  . Heart disease Brother    Rare heart disease (died)   Social History:  History  Alcohol Use  . 0.6 oz/week  . 0 Standard drinks or equivalent, 1 Cans of beer per week     History  Drug Use No    History   Social History  . Marital Status: Single    Spouse Name: N/A  . Number of Children: N/A  . Years of Education: N/A   Social History Main Topics  . Smoking status: Light Tobacco Smoker -- 0.20 packs/day    Types: Cigarettes  . Smokeless tobacco: Never Used  . Alcohol Use: 0.6 oz/week    0 Standard drinks or equivalent, 1 Cans of beer per week  . Drug Use: No  . Sexual Activity: Not on file   Other Topics Concern  . 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   Additional History:    Sleep: Poor  Appetite:  Poor   Assessment:   Musculoskeletal: Strength & Muscle Tone: within normal limits Gait & Station: normal Patient leans: N/A   Psychiatric Specialty Exam: Physical Exam  Review of Systems  Constitutional: Negative.   HENT: Negative.   Eyes: Negative.   Respiratory: Negative.   Cardiovascular: Negative.   Gastrointestinal: Negative.   Genitourinary: Negative.   Musculoskeletal: Positive for joint pain.  Skin: Negative.   Neurological: Negative.   Endo/Heme/Allergies: Negative.   Psychiatric/Behavioral: Positive for depression and substance abuse. The patient is nervous/anxious.     Blood pressure 133/87,  pulse 66, temperature 98.7 F (37.1 C), temperature source Oral, resp. rate 18, height 5' 9.5" (1.765 m), weight 159.666 kg (352 lb).Body mass index is 51.25 kg/(m^2).  General Appearance: Disheveled  Eye SolicitorContact::  Fair  Speech:  Clear and Coherent  Volume:  Decreased  Mood:  Anxious and Depressed  Affect:  Restricted  Thought Process:  Coherent and Goal Directed  Orientation:  Full (Time, Place, and Person)  Thought Content:  deals with his symptoms he worries concerns his sense of shame and guilt his snese of hopelessness  helplessness  Suicidal Thoughts:  No  Homicidal Thoughts:  No  Memory:  Immediate;   Fair Recent;   Fair Remote;   Fair  Judgement:  Fair  Insight:  Present  Psychomotor Activity:  Decreased  Concentration:  Fair  Recall:  FiservFair  Fund of Knowledge:Fair  Language: Fair  Akathisia:  No  Handed:  Right  AIMS (if indicated):     Assets:  Desire for Improvement  ADL's:  Intact  Cognition: WNL  Sleep:  Number of Hours: 5.75     Current Medications: Current Facility-Administered Medications  Medication Dose Route Frequency Provider Last Rate Last Dose  . acamprosate (CAMPRAL) tablet 666 mg  666 mg Oral TID WC Rachael FeeIrving A Madysun Thall, MD   666 mg at 01/01/15 1652  . amLODipine (NORVASC) tablet 10 mg  10 mg Oral Daily Leroy SeaPrashant K Singh, MD   10 mg at 01/01/15 0828  . buPROPion (WELLBUTRIN XL) 24 hr tablet 300 mg  300 mg Oral Daily Rachael FeeIrving A Quinlan Mcfall, MD   300 mg at 01/01/15 0829  . colchicine tablet 0.6 mg  0.6 mg Oral Daily Leatha Gildingostin M Gherghe, MD   0.6 mg at 01/01/15 [id number]  . ibuprofen (ADVIL,MOTRIN) tablet 400 mg  400 mg Oral Q6H PRN Leatha Gildingostin M Gherghe, MD   400 mg at 01/01/15 1249  . metoprolol tartrate (LOPRESSOR) tablet 25 mg  25 mg Oral BID Earney NavyJosephine C Onuoha, NP   25 mg at 01/01/15 1652  . multivitamin with minerals tablet 1 tablet  1 tablet Oral Daily Adonis BrookSheila Agustin, NP   1 tablet at 01/01/15 [phone]  . thiamine (B-1) injection 100 mg  100 mg Intravenous Daily Earney NavyJosephine C Onuoha, NP   100 mg at 12/27/14 1845  . thiamine (VITAMIN B-1) tablet 100 mg  100 mg Oral Daily Adonis BrookSheila Agustin, NP   100 mg at 01/01/15 [id number]  . traZODone (DESYREL) tablet 50 mg  50 mg Oral QHS PRN Worthy FlankIjeoma E Nwaeze, NP   50 mg at 12/31/14 2140    Lab Results:  Results for orders placed or performed during the hospital encounter of 12/27/14 (from the past 48 hour(s))  Uric acid     Status: Abnormal   Collection Time: 12/31/14  7:26 PM  Result Value Ref Range   Uric Acid, Serum 8.5 (H) 4.4 - 7.6 mg/dL    Comment: [facility]    Physical Findings: AIMS: Facial and Oral Movements Muscles of Facial Expression: None, normal Lips and Perioral Area: None, normal Jaw: None, normal Tongue: None, normal,Extremity Movements Upper (arms, wrists, hands, fingers): None, normal Lower (legs, knees, ankles, toes): None, normal, Trunk Movements Neck, shoulders, hips: None, normal, Overall Severity Severity of abnormal movements (highest score from questions above): None, normal Incapacitation due to abnormal movements: None, normal Patient's awareness of abnormal movements (rate only patient's report): No Awareness, Dental Status Current problems with teeth and/or dentures?: No Does patient usually wear  dentures?: No  CIWA:  CIWA-Ar Total: 1 COWS:     Treatment Plan Summary: Daily contact with patient to assess and evaluate symptoms and progress in treatment and Medication management Supportive approach/coping skills Alcohol Dependence/ continue to work a relapse prevention plan Alcohol cravings; use Campral 666 mg TID Depression: will continue the Wellbutrin at 300 mg in AM Insomnia; will D/C the Trazodone and try Remeron 30 mg HS Gout; will continue the colchicine Will continue to work on finding a residential treatment program  Medical Decision Making:  Review of Psycho-Social Stressors (1), Review of Medication Regimen & Side Effects (2) and Review of New Medication or Change in Dosage (2)     Jaimie Redditt A 01/01/2015, 5:53 PM

## 2015-01-02 MED ORDER — MIRTAZAPINE 15 MG PO TABS
15.0000 mg | ORAL_TABLET | Freq: Every day | ORAL | Status: DC
  Administered 2015-01-02 – 2015-01-03 (×2): 15 mg via ORAL
  Filled 2015-01-02 (×3): qty 1

## 2015-01-02 NOTE — Progress Notes (Signed)
Patient rested in bed most of the morning. States he feels sedated and hung over from sleep meds last night. "I want to participate but I'm nodding off." His affect is flat and depressed with congruent mood. Patient offered support and reassurance. Medicated per orders. Patient did get up for lunch and is presently in group. He denies SI/HI/AVH and remains safe. Lawrence MarseillesFriedman, Neesa Knapik Eakes

## 2015-01-02 NOTE — B[facility] Group Notes (Signed)
[facility] Group Notes:  (Nursing/MHT/Case Management/Adjunct)  Date:  01/02/2015  Time:  0900  Type of Therapy:  Nurse Education  Participation Level:  Did Not Attend  Participation Quality:    Affect:    Cognitive:    Insight:    Engagement in Group:    Modes of Intervention:      Summary of Progress/Problems: Patient was invited to group however elected to remain in bed. Patient stating he feels hung over from sleep meds last night and did not want to nod off in group.  Merian CapronFriedman, Krislyn Donnan [facility] 01/02/2015, 0930

## 2015-01-02 NOTE — Plan of Care (Signed)
Problem: Ineffective individual coping Goal: STG: Patient will remain free from self harm Outcome: Progressing Patient has not engaged in self harm and denies SI  Problem: Alteration in mood & ability to function due to Goal: STG-Patient will report withdrawal symptoms Outcome: Completed/Met Date Met:  01/02/15 Patient has completed his detox and denies all physical complaints.

## 2015-01-02 NOTE — B[facility] Group Notes (Signed)
[facility] LCSW Group Therapy  01/02/2015 1:29 PM  Type of Therapy:  Group Therapy  Participation Level:  Active  Participation Quality:  Attentive  Affect:  Appropriate  Cognitive:  Alert and Oriented  Insight:  Engaged  Engagement in Therapy:  Engaged  Modes of Intervention:  Discussion, Education, Exploration, Problem-solving, Rapport Building, Socialization and Support  Summary of Progress/Problems: [facility] Speaker came to talk about his personal journey with substance abuse and addiction. The pt processed ways by which to relate to the speaker. MHA speaker provided handouts and educational information pertaining to groups and services offered by the [facility].   Smart, [name] Donovan LCSWA 01/02/2015, 1:29 PM

## 2015-01-02 NOTE — Progress Notes (Signed)
D Pt. Reports passive SI , he does contract for safety. Pt. Denies HI, with no complaints of pain or discomfort .  A Writer offered support and encouragement, discussed coping skills with pt. As well as discharge plans.  R Pt. Remains safe on the unit.  Pt. Is reporting he is depressed d/t the unknown of where he will go at discharge.  Pt. [facility], but is unsure if they will have a bed for him.  [facility]. [facility] and reports he had to leave b/c he needed time off from his job and was told he could work or leave.  After leaving pt. Was homeless and started drinking.  Pt. Is remorseful, states he is reporting passive SI b/c he does not know what [facility]. Pt. Rates his depression and anxiety at an 8.

## 2015-01-02 NOTE — Progress Notes (Signed)
Recreation Therapy Notes  Animal-Assisted Activity (AAA) Program Checklist/Progress Notes Patient Eligibility Criteria Checklist & Daily Group note for Rec Tx Intervention  Date: 05.10.2016 Time: 2:45pm [facility] [address] [facility]    AAA/T Program Assumption of Risk Form signed by Patient/ or Parent Legal Guardian yes  Patient is free of allergies or sever asthma yes  Patient reports no fear of animals yes  Patient reports no history of cruelty to animals yes  Patient understands his/her participation is voluntary yes  Behavioral Response: Did not attend.   [name], LRT/CTRS  Nosson Wender L 01/02/2015 4:07 PM

## 2015-01-02 NOTE — Progress Notes (Signed)
[facility] MD Progress Note  01/02/2015 7:25 PM [name] Donovan  MRN:  [id number] Subjective:  [name] Donovan continues to endorse depression. States that the medication that he took last night ( Remeron 30 mg ) caused a lot of grogginess in the morning. It did help him sleep. He is not sure what would be his plan B if he was not to get into a residential treatment program. States he really would need more help past this detox. Feels the antidepressant might be starting to help some. He states he really wants to get his life back together.  Principal Problem: Substance induced mood disorder Diagnosis:   Patient Active Problem List   Diagnosis Date Noted  . Swelling of joint of left wrist [M25.432] 12/30/2014  . Substance induced mood disorder [F19.94] 12/27/2014  . Chest pain [R07.9] 11/05/2014  . HTN (hypertension) [I10] 11/05/2014  . Pain in the chest [R07.9]   . History of Alcohol dependence with withdrawal [F10.230] 05/19/2014  . 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 Suicidal ideations [R45.851] 05/19/2014  . Alcohol dependence [F10.20] 04/24/2014  . Major depression [F32.2] 04/22/2014  . Accelerated hypertension [I10] 04/28/2013  . Obstructive sleep apnea [G47.33] 04/28/2013  . Loss of weight [R63.4] 04/28/2013  . Morbid obesity-BMI 50 [E66.01] 04/28/2013  . OSA (obstructive sleep apnea) [G47.33] 04/28/2013   Total Time spent with patient: 30 minutes   Past Medical History:  Past Medical History  Diagnosis Date  . Hypertension   . Depression     Past Surgical History  Procedure Laterality Date  . Wrist surgery     Family History:  Family History  Problem Relation Age of Onset  . Hypertension Mother   . Heart disease Father     Rare heart disease  . Heart disease Brother     Rare heart disease (died)   Social History:  History  Alcohol Use  . 0.6 oz/week  . 0 Standard drinks or equivalent, 1 Cans of beer per week     History  Drug Use No     History   Social History  . Marital Status: Single    Spouse Name: N/A  . Number of Children: N/A  . Years of Education: N/A   Social History Main Topics  . Smoking status: Light Tobacco Smoker -- 0.20 packs/day    Types: Cigarettes  . Smokeless tobacco: Never Used  . Alcohol Use: 0.6 oz/week    0 Standard drinks or equivalent, 1 Cans of beer per week  . Drug Use: No  . Sexual Activity: Not on file   Other Topics Concern  . None   Social History Narrative   Sister is a family medicine doctor in [place].   Additional History:    Sleep: Fair  Appetite:  Fair   Assessment:   Musculoskeletal: Strength & Muscle Tone: within normal limits Gait & Station: normal Patient leans: N/A   Psychiatric Specialty Exam: Physical Exam  Review of Systems  Constitutional: Positive for malaise/fatigue.  HENT: Negative.   Eyes: Negative.   Respiratory: Negative.   Cardiovascular: Negative.   Gastrointestinal: Negative.   Genitourinary: Negative.   Musculoskeletal: Negative.   Skin: Negative.   Neurological: Negative.   Endo/Heme/Allergies: Negative.   Psychiatric/Behavioral: Positive for depression and substance abuse. The patient is nervous/anxious.     Blood pressure 125/76, pulse 62, temperature 98.2 F (36.8 C), temperature source Oral, resp. rate 17, height 5' 9.5" (1.765 m), weight 159.666 kg (352 lb).Body mass index is 51.25  kg/(m^2).  General Appearance: Disheveled  Eye SolicitorContact::  Fair  Speech:  Clear and Coherent  Volume:  Decreased  Mood:  Anxious, Depressed and worried  Affect:  sad anxious worried  Thought Process:  Coherent and Goal Directed  Orientation:  Full (Time, Place, and Person)  Thought Content:  symptoms events worries concerns  Suicidal Thoughts:  No  Homicidal Thoughts:  No  Memory:  Immediate;   Fair Recent;   Fair Remote;   Fair  Judgement:  Fair  Insight:  Present  Psychomotor Activity:  Decreased  Concentration:  Fair  Recall:  [employer]akFair   Fund of Knowledge:Fair  Language: Fair  Akathisia:  No  Handed:  Right  AIMS (if indicated):     Assets:  Desire for Improvement  ADL's:  Intact  Cognition: WNL  Sleep:  Number of Hours: 5.75     Current Medications: Current Facility-Administered Medications  Medication Dose Route Frequency Provider Last Rate Last Dose  . acamprosate (CAMPRAL) tablet 666 mg  666 mg Oral TID WC Rachael FeeIrving A Nika Yazzie, MD   666 mg at 01/02/15 1723  . amLODipine (NORVASC) tablet 10 mg  10 mg Oral Daily Leroy SeaPrashant K Singh, MD   10 mg at 01/02/15 [id number]  . buPROPion (WELLBUTRIN XL) 24 hr tablet 300 mg  300 mg Oral Daily Rachael FeeIrving A Ashya Nicolaisen, MD   300 mg at 01/02/15 [id number]  . colchicine tablet 0.6 mg  0.6 mg Oral Daily Leatha Gildingostin M Gherghe, MD   0.6 mg at 01/02/15 [id number]  . ibuprofen (ADVIL,MOTRIN) tablet 400 mg  400 mg Oral Q6H PRN Leatha Gildingostin M Gherghe, MD   400 mg at 01/01/15 2206  . metoprolol tartrate (LOPRESSOR) tablet 25 mg  25 mg Oral BID Earney NavyJosephine C Onuoha, NP   25 mg at 01/02/15 1723  . mirtazapine (REMERON) tablet 15 mg  15 mg Oral QHS Rachael FeeIrving A Stevens Magwood, MD      . multivitamin with minerals tablet 1 tablet  1 tablet Oral Daily Adonis BrookSheila Agustin, NP   1 tablet at 01/02/15 [phone]  . thiamine (B-1) injection 100 mg  100 mg Intravenous Daily Earney NavyJosephine C Onuoha, NP   100 mg at 12/27/14 1845  . thiamine (VITAMIN B-1) tablet 100 mg  100 mg Oral Daily Adonis BrookSheila Agustin, NP   100 mg at 01/02/15 [id number]    Lab Results:  Results for orders placed or performed during the hospital encounter of 12/27/14 (from the past 48 hour(s))  Uric acid     Status: Abnormal   Collection Time: 12/31/14  7:26 PM  Result Value Ref Range   Uric Acid, Serum 8.5 (H) 4.4 - 7.6 mg/dL    Comment: [facility]    Physical Findings: AIMS: Facial and Oral Movements Muscles of Facial Expression: None, normal Lips and Perioral Area: None, normal Jaw: None, normal Tongue: None, normal,Extremity Movements Upper (arms, wrists, hands, fingers):  None, normal Lower (legs, knees, ankles, toes): None, normal, Trunk Movements Neck, shoulders, hips: None, normal, Overall Severity Severity of abnormal movements (highest score from questions above): None, normal Incapacitation due to abnormal movements: None, normal Patient's awareness of abnormal movements (rate only patient's report): No Awareness, Dental Status Current problems with teeth and/or dentures?: No Does patient usually wear dentures?: No  CIWA:  CIWA-Ar Total: 1 COWS:     Treatment Plan Summary: Daily contact with patient to assess and evaluate symptoms and progress in treatment and Medication management  Supportive approach/coping skills Alcohol Dependence; continue the detox protocol/work  a relapse prevention plan Depression; continue the Wellbutrin XL at 300 mg daily in AM Work with CBT/mindfulness Insomnia; will decrease the Remeron to 15 mg HS and reassess to see if the morning grogginess goes away Continue to work on finding a residential treatment program  Medical Decision Making:  Review of Psycho-Social Stressors (1), Review of Medication Regimen & Side Effects (2) and Review of New Medication or Change in Dosage (2)     Dalya Maselli A 01/02/2015, 7:25 PM

## 2015-01-03 ENCOUNTER — Encounter (HOSPITAL_COMMUNITY): Payer: Self-pay | Admitting: Registered Nurse

## 2015-01-03 DIAGNOSIS — T1491 Suicide attempt: Secondary | ICD-10-CM

## 2015-01-03 MED ORDER — MIRTAZAPINE 15 MG PO TABS: 15.0000 mg | ORAL_TABLET | Freq: Every day | ORAL | Status: DC

## 2015-01-03 MED ORDER — IBUPROFEN 200 MG PO TABS: 600.0000 mg | ORAL_TABLET | Freq: Three times a day (TID) | ORAL | Status: AC | PRN

## 2015-01-03 MED ORDER — ACAMPROSATE CALCIUM 333 MG PO TBEC: 666.0000 mg | DELAYED_RELEASE_TABLET | Freq: Three times a day (TID) | ORAL | Status: DC

## 2015-01-03 MED ORDER — AMLODIPINE BESYLATE 10 MG PO TABS: 10.0000 mg | ORAL_TABLET | Freq: Every day | ORAL | Status: DC

## 2015-01-03 MED ORDER — COLCHICINE 0.6 MG PO TABS: 0.6000 mg | ORAL_TABLET | Freq: Every day | ORAL | Status: DC

## 2015-01-03 MED ORDER — NITROGLYCERIN 0.4 MG SL SUBL: 0.4000 mg | SUBLINGUAL_TABLET | SUBLINGUAL | Status: DC | PRN

## 2015-01-03 MED ORDER — ADULT MULTIVITAMIN W/MINERALS CH: 1.0000 | ORAL_TABLET | Freq: Every day | ORAL | Status: DC

## 2015-01-03 MED ORDER — BUPROPION HCL ER (XL) 300 MG PO TB24: 300.0000 mg | ORAL_TABLET | Freq: Every day | ORAL | Status: AC

## 2015-01-03 MED ORDER — METOPROLOL TARTRATE 25 MG PO TABS: 25.0000 mg | ORAL_TABLET | Freq: Two times a day (BID) | ORAL | Status: AC

## 2015-01-03 NOTE — [facility] Suicide Risk Assessment (Signed)
[facility]   Demographic Factors:  Male  Total Time spent with patient: 30 minutes  Musculoskeletal: Strength & Muscle Tone: within normal limits Gait & Station: normal Patient leans: N/A  Psychiatric Specialty Exam: Physical Exam  Review of Systems  Constitutional: Negative.   HENT: Negative.   Eyes: Negative.   Respiratory: Negative.   Cardiovascular: Negative.   Gastrointestinal:       Some cramping reported earlier  Genitourinary: Negative.   Musculoskeletal: Negative.   Skin: Negative.   Neurological: Negative.   Psychiatric/Behavioral: Positive for depression and substance abuse. The patient is nervous/anxious.     Blood pressure 156/103, pulse 73, temperature 98 F (36.7 C), temperature source Oral, resp. rate 20, height 5' 9.5" (1.765 m), weight 159.666 kg (352 lb).Body mass index is 51.25 kg/(m^2).  General Appearance: Fairly Groomed  Patent attorneyye Contact::  Fair  Speech:  Clear and Coherent409  Volume:  Decreased  Mood:  Anxious  Affect:  Appropriate  Thought Process:  Coherent and Goal Directed  Orientation:  Full (Time, Place, and Person)  Thought Content:  plans as he moves on, relapse prevention plan  Suicidal Thoughts:  No  Homicidal Thoughts:  No  Memory:  Immediate;   Fair Recent;   Fair Remote;   Fair  Judgement:  Fair  Insight:  Present  Psychomotor Activity:  Normal  Concentration:  Fair  Recall:  [employer]Fair  Fund of Knowledge:Fair  Language: Fair  Akathisia:  No  Handed:  Right  AIMS (if indicated):     Assets:  Desire for Improvement  Sleep:  Number of Hours: 5.75  Cognition: WNL  ADL's:  Intact   Have you used any form of tobacco in the last 30 days? (Cigarettes, Smokeless Tobacco, Cigars, and/or Pipes): Yes  Has this patient used any form of tobacco in the last 30 days? (Cigarettes, Smokeless Tobacco, Cigars, and/or Pipes) Yes, A prescription for an FDA-approved tobacco cessation medication was offered at discharge and  the patient refused  Mental Status Per Nursing Assessment::   On Admission:  Suicidal ideation indicated by patient, Suicide plan, Plan includes specific time, place, or method, Self-harm thoughts, Intention to act on suicide plan, Belief that plan would result in death  Current Mental Status by Physician: In full contact with reality. There are no active S/S of withdrawal. There are no active SI plans or intent. He states he definitely feels better than when he came in. Hopes to be [facility] and to continue outpatient treatment once D/C. Hopes to be able to get in a half way house after [facility]   Loss Factors: Decline in physical health  Historical Factors: NA  Risk Reduction Factors:   wants to do better  Continued Clinical Symptoms:  Depression:   Comorbid alcohol abuse/dependence Alcohol/Substance Abuse/Dependencies  Cognitive Features That Contribute To Risk:  Closed-mindedness, Polarized thinking and Thought constriction (tunnel vision)    Suicide Risk:  Minimal: No identifiable suicidal ideation.  Patients presenting with no risk factors but with morbid ruminations; may be classified as minimal risk based on the severity of the depressive symptoms  Principal Problem: Substance induced mood disorder Discharge Diagnoses:  Patient Active Problem List   Diagnosis Date Noted  . Swelling of joint of left wrist [M25.432] 12/30/2014  . Substance induced mood disorder [F19.94] 12/27/2014  . Chest pain [R07.9] 11/05/2014  . HTN (hypertension) [I10] 11/05/2014  . Pain in the chest [R07.9]   . History of  Alcohol dependence with withdrawal [F10.230] 05/19/2014  . 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 Suicidal ideations [R45.851] 05/19/2014  . Alcohol dependence [F10.20] 04/24/2014  . Major depression [F32.2] 04/22/2014  . Accelerated hypertension [I10] 04/28/2013  . Obstructive sleep apnea [G47.33] 04/28/2013  .  Loss of weight [R63.4] 04/28/2013  . Morbid obesity-BMI 50 [E66.01] 04/28/2013  . OSA (obstructive sleep apnea) [G47.33] 04/28/2013    Follow-up Information    [facility].   Why:  Screening scheduled for this date at 8:00AM. You [place] to this appt. Please keep in mind that if accepted, you may not get directly admitted. Thank you.    Contact information:   [address] [place], [place] [address] Phone: [phone] Fax: [phone]      [facility]   Why:  Walk in between 8am-9am Monday through Friday for hospital follow-up/medication management/assessment for outpatient therapy services.    Contact information:   [address]  [place], [place] [address] Phone: [phone] Fax: [phone]      Plan Of Care/Follow-up recommendations:  Activity:  as tolerated Diet:  regular Follow up [facility] as above and then [facility] Is patient on multiple antipsychotic therapies at discharge:  No   Has Patient had three or more failed trials of antipsychotic monotherapy by history:  No  Recommended Plan for Multiple Antipsychotic Therapies: NA    Tymeka Privette A 01/03/2015, 7:12 PM

## 2015-01-03 NOTE — Discharge Summary (Signed)
Physician Discharge Summary Note  Patient:  [name] Donovan is an 44 y.o., male MRN:  [id number] DOB:  1970-12-28 Patient phone:  [phone] (home)  Patient address:   [address] [place] [address],  Total Time spent with patient: 45 minutes  Date of Admission:  12/27/2014 Date of Discharge: 01/03/2015  Reason for Admission:  Suicide attempt  Principal Problem: Substance induced mood disorder Discharge Diagnoses: Patient Active Problem List   Diagnosis Date Noted  . Swelling of joint of left wrist [M25.432] 12/30/2014  . Substance induced mood disorder [F19.94] 12/27/2014  . Chest pain [R07.9] 11/05/2014  . HTN (hypertension) [I10] 11/05/2014  . Pain in the chest [R07.9]   . History of Alcohol dependence with withdrawal [F10.230] 05/19/2014  . 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 Suicidal ideations [R45.851] 05/19/2014  . Alcohol dependence [F10.20] 04/24/2014  . Major depression [F32.2] 04/22/2014  . Accelerated hypertension [I10] 04/28/2013  . Obstructive sleep apnea [G47.33] 04/28/2013  . Loss of weight [R63.4] 04/28/2013  . Morbid obesity-BMI 50 [E66.01] 04/28/2013  . OSA (obstructive sleep apnea) [G47.33] 04/28/2013    Musculoskeletal: Strength & Muscle Tone: within normal limits Gait & Station: normal Patient leans: N/A  Psychiatric Specialty Exam:  SEE SRA Physical Exam  Vitals reviewed.   Review of Systems  Psychiatric/Behavioral: The patient is nervous/anxious.   All other systems reviewed and are negative.   Blood pressure 156/103, pulse 73, temperature 98 F (36.7 C), temperature source Oral, resp. rate 20, height 5' 9.5" (1.765 m), weight 159.666 kg (352 lb).Body mass index is 51.25 kg/(m^2).  Have you used any form of tobacco in the last 30 days? (Cigarettes, Smokeless Tobacco, Cigars, and/or Pipes): Yes   Has this patient used any form of tobacco in the last 30 days? (Cigarettes, Smokeless Tobacco, Cigars,  and/or Pipes) No  Past Medical History:  Past Medical History  Diagnosis Date  . Hypertension   . Depression     Past Surgical History  Procedure Laterality Date  . Wrist surgery     Family History:  Family History  Problem Relation Age of Onset  . Hypertension Mother   . Heart disease Father     Rare heart disease  . Heart disease Brother     Rare heart disease (died)   Social History:  History  Alcohol Use  . 0.6 oz/week  . 0 Standard drinks or equivalent, 1 Cans of beer per week     History  Drug Use No    History   Social History  . Marital Status: Single    Spouse Name: N/A  . Number of Children: N/A  . Years of Education: N/A   Social History Main Topics  . Smoking status: Light Tobacco Smoker -- 0.20 packs/day    Types: Cigarettes  . Smokeless tobacco: Never Used  . Alcohol Use: 0.6 oz/week    0 Standard drinks or equivalent, 1 Cans of beer per week  . Drug Use: No  . Sexual Activity: Not on file   Other Topics Concern  . None   Social History Narrative   Sister is a family medicine doctor in [place].   Risk to Self: Is patient at risk for suicide?: Yes Risk to Others:   Prior Inpatient Therapy:   Prior Outpatient Therapy:    Level of Care:  OP  Hospital Course:  Jerick Khachatryan is a 44 y.o., homeless, African-American male presenting under IVC from [facility] due to SI and recent suicide attempt via  attempted hanging with a belt. Pt reports that the belt broke but that he continues to experience SI, but without any current plan or intention. Pt presents with fair eye-contact, depressed mood and affect, linear thought process without evidence of delusion, and coherent and relevant speech pattern. He is well-oriented and does not appear to be responding to any internal stimuli. Pt is reportedly not med-compliant and has begun drinking again after 6 months of sobriety. Pt endorses sx including racing thoughts, fatigue, feelings of hopelessness, insomnia,  feelings of worthlessness, anhedonia, social isolation, increased irritability, and suicidal ideations. Pt reports several current stressors and possible triggers for his recent suicide attempt, including being kicked out of his former SA tx center about a month ago -- which has resulted in a lot of self-blame and guilt and homelessness, financial stressors, and lack of support system.   Ledora BottcherChristopher Forner was admitted for Substance induced mood disorder and crisis management.  He was treated discharged with the medications listed below under Medication List.  Medical problems were identified and treated as needed.  Home medications were restarted as appropriate.  Improvement was monitored by observation and Ledora Bottcherhristopher Drozdowski daily report of symptom reduction.  Emotional and mental status was monitored by daily self-inventory reports completed by Ledora Bottcherhristopher Sorter and clinical staff.         Ledora BottcherChristopher Wangerin was evaluated by the treatment team for stability and plans for continued recovery upon discharge.  Ledora BottcherChristopher Swenor motivation was an integral factor for scheduling further treatment.  Employment, transportation, bed availability, health status, family support, and any pending legal issues were also considered during his hospital stay.  He was offered further treatment options upon discharge including but not limited to Residential, Intensive Outpatient, and Outpatient treatment.  Ledora BottcherChristopher Rosengren will follow up with the services as listed below under Follow Up Information.     Upon completion of this admission the patient was both mentally and medically stable for discharge denying suicidal/homicidal ideation, auditory/visual/tactile hallucinations, delusional thoughts and paranoia.      Consults:  psychiatry  Significant Diagnostic Studies:  labs: per ED  Discharge Vitals:   Blood pressure 156/103, pulse 73, temperature 98 F (36.7 C), temperature source Oral, resp. rate 20, height 5' 9.5" (1.765  m), weight 159.666 kg (352 lb). Body mass index is 51.25 kg/(m^2). Lab Results:   Results for orders placed or performed during the hospital encounter of 12/27/14 (from the past 72 hour(s))  Uric acid     Status: Abnormal   Collection Time: 12/31/14  7:26 PM  Result Value Ref Range   Uric Acid, Serum 8.5 (H) 4.4 - 7.6 mg/dL    Comment: Performed at [facility]    Physical Findings: AIMS: Facial and Oral Movements Muscles of Facial Expression: None, normal Lips and Perioral Area: None, normal Jaw: None, normal Tongue: None, normal,Extremity Movements Upper (arms, wrists, hands, fingers): None, normal Lower (legs, knees, ankles, toes): None, normal, Trunk Movements Neck, shoulders, hips: None, normal, Overall Severity Severity of abnormal movements (highest score from questions above): None, normal Incapacitation due to abnormal movements: None, normal Patient's awareness of abnormal movements (rate only patient's report): No Awareness, Dental Status Current problems with teeth and/or dentures?: No Does patient usually wear dentures?: No  CIWA:  CIWA-Ar Total: 1 COWS:      See Psychiatric Specialty Exam and Suicide Risk Assessment completed by Attending Physician prior to discharge.  Discharge destination:  Home  Is patient on multiple antipsychotic therapies at discharge:  No  Has Patient had three or more failed trials of antipsychotic monotherapy by history:  No  Recommended Plan for Multiple Antipsychotic Therapies: NA      Discharge Instructions    Diet general    Complete by:  As directed      Discharge instructions    Complete by:  As directed   Take all of you medications as prescribed by your mental healthcare provider.  Report any adverse effects and reactions from your medications to your outpatient provider promptly. Do not engage in alcohol and or illegal drug use while on prescription medicines. In the event of worsening symptoms call the  crisis hotline, 911, and or go to the nearest emergency department for appropriate evaluation and treatment of symptoms. Follow-up with your primary care provider for your medical issues, concerns and or health care needs.   Keep all scheduled appointments.  If you are unable to keep an appointment call to reschedule.  Let the nurse know if you will need medications before next scheduled appointment.     Increase activity slowly    Complete by:  As directed             Medication List    STOP taking these medications        hydrochlorothiazide 25 MG tablet  Commonly known as:  HYDRODIURIL     HYDROcodone-acetaminophen 5-325 MG per tablet  Commonly known as:  NORCO/VICODIN     rosuvastatin 20 MG tablet  Commonly known as:  CRESTOR     sertraline 50 MG tablet  Commonly known as:  ZOLOFT     traZODone 50 MG tablet  Commonly known as:  DESYREL      TAKE these medications      Indication   acamprosate 333 MG tablet  Commonly known as:  CAMPRAL  Take 2 tablets (666 mg total) by mouth 3 (three) times daily with meals. For alcoholism   Indication:  Excessive Use of Alcohol     amLODipine 10 MG tablet  Commonly known as:  NORVASC  Take 1 tablet (10 mg total) by mouth daily. For high blood pressure   Indication:  High Blood Pressure     buPROPion 300 MG 24 hr tablet  Commonly known as:  WELLBUTRIN XL  Take 1 tablet (300 mg total) by mouth daily. For depression   Indication:  Major Depressive Disorder     colchicine 0.6 MG tablet  Take 1 tablet (0.6 mg total) by mouth daily. For gout   Indication:  Gout     ibuprofen 200 MG tablet  Commonly known as:  ADVIL,MOTRIN  Take 3-4 tablets (600-800 mg total) by mouth every 8 (eight) hours as needed for moderate pain.   Indication:  Mild to Moderate Pain     metoprolol tartrate 25 MG tablet  Commonly known as:  LOPRESSOR  Take 1 tablet (25 mg total) by mouth 2 (two) times daily. For high blood pressure   Indication:  High Blood  Pressure     mirtazapine 15 MG tablet  Commonly known as:  REMERON  Take 1 tablet (15 mg total) by mouth at bedtime. For sleep/Depression   Indication:  Trouble Sleeping, Major Depressive Disorder     multivitamin with minerals Tabs tablet  Take 1 tablet by mouth daily. Nutritional support   Indication:  Nutritional Support, nu     nitroGLYCERIN 0.4 MG SL tablet  Commonly known as:  NITROSTAT  Place 1 tablet (0.4 mg total) under the tongue every  5 (five) minutes x 3 doses as needed for chest pain.   Indication:  Acute Angina Pectoris       Follow-up Information    Follow up with [facility] On 01/04/2015.   Why:  Screening scheduled for this date at 8:00AM. You must bring photo ID with [facility] address to this appt. Please keep in mind that if accepted, you may not get directly admitted. Thank you.    Contact information:   [address] [place], [place] [address] Phone: [phone] Fax: [phone]      Follow up with [facility].   Why:  Walk in between 8am-9am Monday through Friday for hospital follow-up/medication management/assessment for outpatient therapy services.    Contact information:   [address], [place] [address] Phone: [phone] Fax: [phone]      Follow-up recommendations:  Activity:   as tol, diet as tol  Comments:  1.  Take all your medications as prescribed.              2.  Report any adverse side effects to outpatient provider.                       3.  Patient instructed to not use alcohol or illegal drugs while on prescription medicines.            4.  In the event of worsening symptoms, instructed patient to call 911, the crisis hotline or go to nearest emergency room for evaluation of symptoms.  Total Discharge Time: 45 min  Signed: Velna Hatchet May Agustin AGNP-BC 01/03/2015, 6:00 PM  I personally assessed the patient and formulated the plan Madie Reno A. Dub Mikes, M.D.

## 2015-01-03 NOTE — Progress Notes (Signed)
Recreation Therapy Notes   Date: 01/03/15 Time: 9:30am Location: 300 [address] Group Room  Group Topic: Stress Management  Goal Area(s) Addresses:  Patient will actively participate in stress management techniques presented during session.   Behavioral Response:  Did not attend.    [name] Donovan, LRT/CTRS        [name] Donovan 01/03/2015 3:15 PM 

## 2015-01-03 NOTE — Progress Notes (Signed)
Patient in his room during this assessment. Mood and affects appropriate. He reported a good day and said he would be [facility]. He denied SI/HI and denied Hallucinations, and also denied withdrawal symptom. He reported that he planned [facility]. Writer encouraged and supported patient. Q 15 minute check continues as ordered to maintain safety.

## 2015-01-03 NOTE — B[facility] Group Notes (Signed)
[facility]   01/03/2015 9:36 AM  Participation Quality:   Appropriate   Mood/Affect:  Appropriate  Depression Rating:  5  Anxiety Rating:  5  Thoughts of Suicide:  No Will you contract for safety?   NA  Current AVH:  No  Plan for Discharge/Comments:  Pt reports that he is ready to d/c tomorrow AM for screening and possible admission [facility]. [facility] No [facility] available at [facility]. Pt reports fair sleep and no withdrawals. Pt pleasant and calm during group. CSW encouraged pt to call [name] at [facility]   Transportation Means: taxi   Supports: some family supports.   Smart, American FinancialHeather LCSWA

## 2015-01-03 NOTE — Progress Notes (Signed)
[facility]  01/03/2015 7:03 PM [name] Donovan  MRN:  [id number] Subjective:  [name] Donovan endorses that he slept better and did not feel groggy in the morning with the decrease in the Remeron. Overall he feels better. He is anxiously waiting to see if he is going to be able to get a bed [facility]. He is not sure what plan B will be if he does not get in. He states he is committed to stay sober Principal Problem: Substance induced mood disorder Diagnosis:   Patient Active Problem List   Diagnosis Date Noted  . Swelling of joint of left wrist [M25.432] 12/30/2014  . Substance induced mood disorder [F19.94] 12/27/2014  . Chest pain [R07.9] 11/05/2014  . HTN (hypertension) [I10] 11/05/2014  . Pain in the chest [R07.9]   . History of Alcohol dependence with withdrawal [F10.230] 05/19/2014  . 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 Suicidal ideations [R45.851] 05/19/2014  . Alcohol dependence [F10.20] 04/24/2014  . Major depression [F32.2] 04/22/2014  . Accelerated hypertension [I10] 04/28/2013  . Obstructive sleep apnea [G47.33] 04/28/2013  . Loss of weight [R63.4] 04/28/2013  . Morbid obesity-BMI 50 [E66.01] 04/28/2013  . OSA (obstructive sleep apnea) [G47.33] 04/28/2013   Total Time spent with patient: 30 minutes   Past Medical History:  Past Medical History  Diagnosis Date  . Hypertension   . Depression     Past Surgical History  Procedure Laterality Date  . Wrist surgery     Family History:  Family History  Problem Relation Age of Onset  . Hypertension Mother   . Heart disease Father     Rare heart disease  . Heart disease Brother     Rare heart disease (died)   Social History:  History  Alcohol Use  . 0.6 oz/week  . 0 Standard drinks or equivalent, 1 Cans of beer per week     History  Drug Use No    History   Social History  . Marital Status: Single    Spouse Name: N/A  . Number of Children: N/A  . Years of  Education: N/A   Social History Main Topics  . Smoking status: Light Tobacco Smoker -- 0.20 packs/day    Types: Cigarettes  . Smokeless tobacco: Never Used  . Alcohol Use: 0.6 oz/week    0 Standard drinks or equivalent, 1 Cans of beer per week  . Drug Use: No  . Sexual Activity: Not on file   Other Topics Concern  . None   Social History Narrative   Sister is a family medicine doctor in [place].   Additional History:    Sleep: Fair  Appetite:  Fair   Assessment:   Musculoskeletal: Strength & Muscle Tone: within normal limits Gait & Station: normal Patient leans: N/A   Psychiatric Specialty Exam: Physical Exam  Review of Systems  Constitutional: Positive for malaise/fatigue.  HENT: Negative.   Eyes: Negative.   Respiratory: Negative.   Cardiovascular: Negative.   Gastrointestinal: Negative.   Genitourinary: Negative.   Musculoskeletal: Negative.   Skin: Negative.   Neurological: Negative.   Endo/Heme/Allergies: Negative.   Psychiatric/Behavioral: Positive for depression and substance abuse. The patient is nervous/anxious.     Blood pressure 156/103, pulse 73, temperature 98 F (36.7 C), temperature source Oral, resp. rate 20, height 5' 9.5" (1.765 m), weight 159.666 kg (352 lb).Body mass index is 51.25 kg/(m^2).  General Appearance: Fairly Groomed  Patent attorneyye Contact::  Fair  Speech:  Clear and Coherent  Volume:  Decreased  Mood:  Anxious and worried  Affect:  anxious worried  Thought Process:  Coherent and Goal Directed  Orientation:  Full (Time, Place, and Person)  Thought Content:  symptoms envents worries concern  Suicidal Thoughts:  No  Homicidal Thoughts:  No  Memory:  Immediate;   Fair Recent;   Fair Remote;   Fair  Judgement:  Fair  Insight:  Present  Psychomotor Activity:  Normal  Concentration:  Fair  Recall:  FiservFair  Fund of Knowledge:Fair  Language: Fair  Akathisia:  No  Handed:  Right  AIMS (if indicated):     Assets:  Desire for  Improvement  ADL's:  Intact  Cognition: WNL  Sleep:  Number of Hours: 5.75     Current Medications: Current Facility-Administered Medications  Medication Dose Route Frequency Provider Last Rate Last Dose  . acamprosate (CAMPRAL) tablet 666 mg  666 mg Oral TID WC Rachael FeeIrving A Itxel Wickard, MD   666 mg at 01/03/15 1730  . amLODipine (NORVASC) tablet 10 mg  10 mg Oral Daily Leroy SeaPrashant K Singh, MD   10 mg at 01/03/15 0850  . buPROPion (WELLBUTRIN XL) 24 hr tablet 300 mg  300 mg Oral Daily Rachael FeeIrving A Yehonatan Grandison, MD   300 mg at 01/03/15 0850  . colchicine tablet 0.6 mg  0.6 mg Oral Daily Leatha Gildingostin M Gherghe, MD   0.6 mg at 01/03/15 0850  . ibuprofen (ADVIL,MOTRIN) tablet 400 mg  400 mg Oral Q6H PRN Leatha Gildingostin M Gherghe, MD   400 mg at 01/02/15 2156  . metoprolol tartrate (LOPRESSOR) tablet 25 mg  25 mg Oral BID Earney NavyJosephine C Onuoha, NP   25 mg at 01/03/15 1730  . mirtazapine (REMERON) tablet 15 mg  15 mg Oral QHS Rachael FeeIrving A Deigo Alonso, MD   15 mg at 01/02/15 2155  . multivitamin with minerals tablet 1 tablet  1 tablet Oral Daily Adonis BrookSheila Agustin, NP   1 tablet at 01/03/15 0850  . thiamine (B-1) injection 100 mg  100 mg Intravenous Daily Earney NavyJosephine C Onuoha, NP   100 mg at 12/27/14 1845  . thiamine (VITAMIN B-1) tablet 100 mg  100 mg Oral Daily Adonis BrookSheila Agustin, NP   100 mg at 01/03/15 [id number]    Lab Results: No results found for this or any previous visit (from the past 48 hour(s)).  Physical Findings: AIMS: Facial and Oral Movements Muscles of Facial Expression: None, normal Lips and Perioral Area: None, normal Jaw: None, normal Tongue: None, normal,Extremity Movements Upper (arms, wrists, hands, fingers): None, normal Lower (legs, knees, ankles, toes): None, normal, Trunk Movements Neck, shoulders, hips: None, normal, Overall Severity Severity of abnormal movements (highest score from questions above): None, normal Incapacitation due to abnormal movements: None, normal Patient's awareness of abnormal movements (rate only patient's  report): No Awareness, Dental Status Current problems with teeth and/or dentures?: No Does patient usually wear dentures?: No  CIWA:  CIWA-Ar Total: 1 COWS:     Treatment Plan Summary: Daily contact with patient to assess and evaluate symptoms and progress in treatment and Medication management Supportive approach/coping skills Alcohol dependence; continue to work a relapse prevention plan Major Depression; continue the Wellbutrin XL 300 mg in Am and the Remeron 15 mg HS Insomnia; continue the Remeron 15 mg HS Facilitate being [facility] in the morning  Medical Decision Making:  Review of Psycho-Social Stressors (1), Review of Medication Regimen & Side Effects (2) and Review of New Medication or Change in Dosage (2)  Kyzen Horn A 01/03/2015, 7:03 PM

## 2015-01-03 NOTE — BHH Group Notes (Signed)
BHH LCSW Group Therapy  01/03/2015 1:22 PM  Type of Therapy:  Group Therapy  Participation Level:  Active  Participation Quality:  Attentive  Affect:  Appropriate  Cognitive:  Alert and Oriented  Insight:  Improving  Engagement in Therapy:  Engaged  Modes of Intervention:  Confrontation, Discussion, Education, Exploration, Problem-solving, Rapport Building, Socialization and Support  Summary of Progress/Problems: Today's Topic: Overcoming Obstacles. Patients identified one short term goal and potential obstacles in reaching this goal. Patients processed barriers involved in overcoming these obstacles. Patients identified steps necessary for overcoming these obstacles and explored motivation (internal and external) for facing these difficulties head on. [name] Donovan was attentive and engaged during today's processing group. He shared that his biggest obstacle is "getting into treatment." He explored other options in order to prepare for not getting accepted --pt open to calling [name]  ([facility] and [facility] this evening for alternative options. [name] Donovan shared that ideally, [facility] will [facility], giving him time to learn more skills for managing his sobriety while planning the next step in his life-"getting into a sober living house." [name] Donovan continues to show progress in the group setting and improving insight.   Smart, Willys Salvino LCSWA 01/03/2015, 1:22 PM

## 2015-01-03 NOTE — Progress Notes (Signed)
  [facility] :  Will you be returning to the same living situation after discharge:  No.[facility] Screening and possible admission scheduled for Thursday, 01/04/15.  At discharge, do you have transportation home?: Yes,  taxi-D/c scheduled for 7:15AM Thursday, 01/04/15.  Do you have the ability to pay for your medications: Yes,  mental health  Release of information consent forms completed and submitted to medical records by CSW.  Patient to Follow up at: Follow-up Information    [facility] On [facility].   Why:  Screening scheduled for this date at 8:00AM. You must [place] photo [place] [facility] [place] to this appt. Please keep in mind that if accepted, you may not get directly admitted. Thank you.    Contact information:   [address] [place], [place] [address] Phone: [phone] Fax: [phone]      [facility].   Why:  Walk in between 8am-9am Monday through Friday for hospital follow-up/medication management/assessment for outpatient therapy services.    Contact information:   [address]  [place], [place] [address] Phone: [phone] Fax: [phone]      Patient denies SI/HI: Yes,  during group/self report    Safety Planning and Suicide Prevention discussed: Yes,  SPE completed with pt's exgirlfriend per pt's request. SPI pamphlet provided to pt including Mobile crisis information. Pt was encouraged to share this information with support network, ask questions, and talk about any concerns relating to SPE.  Have you used any form of tobacco in the last 30 days? (Cigarettes, Smokeless Tobacco, Cigars, and/or Pipes): Yes  Has patient been referred to the Quitline?: Patient refused referral  Smart, Lebron QuamHeather LCSWA  01/03/2015, 12:42 PM

## 2015-01-03 NOTE — Progress Notes (Signed)
D: Patient has depressed affect and mood. He reported on the self inventory sheet that he's sleeping fair at night, appetite and ability to concentrate are good and energy level is normal. Patient rates depression, hopelessness and anxiety "5". No interaction with peers or participation in groups. Adheres to the medication regimen.  A: Support and encouragement provided to patient. Administered medications per ordering MD. Monitor Q15 minute checks for safety.  R: Patient receptive. Denies SI/HI/AVH. Patient remains safe on the unit.

## 2015-01-03 NOTE — Tx Team (Signed)
Interdisciplinary Treatment Plan Update (Adult)  Date:  01/03/2015  Time Reviewed:  12:35 PM   Progress in Treatment: Attending groups: Patient is attending groups. Participating in groups:  Patient engages in discussion Taking medication as prescribed:  Patient is taking medications Tolerating medication:  Patient is tolerating medications Family/Significant othe contact made:   Yes, collateral contact with ex-girlfriend. Patient understands diagnosis:Yes, patient understands diagnosis and need for treatment Discussing patient identified problems/goals with staff:  Yes, patient is able to express goals/problems Medical problems stabilized or resolved:  Yes Denies suicidal/homicidal ideation: Yes, patient is denying SI/HI. Issues/concerns per patient self-inventory:   Other:   Discharge Plan or Barriers:  [facility] due to medical issues (sleep apnea). No beds available at [facility]. Pt has [facility] screening on 01/04/15 at 8:00AM. Pt plans [facility] and has been given [name] information and [facility] list/encouraged to call. Pt has also been given AA list, IRC info, and Mental health association pamphlet. Pt will take taxi with 2 other pts tomorrow morning [facility].   Reason for Continuation of Hospitalization: Medication management   Comments:   [name] BottcherChristopher Donovan is a 44 y.o., homeless, African-American male [facility] due to SI and recent suicide attempt via attempted hanging with a belt. Pt reports that the belt broke but that he continues to experience SI, but without any current plan or intention. Pt presents with fair eye-contact, depressed mood and affect, linear thought process without evidence of delusion, and coherent and relevant speech pattern. He is well-oriented and does not appear to be responding to any internal stimuli. Pt is reportedly not med-compliant and has  begun drinking again after 6 months of sobriety. Pt endorses sx including racing thoughts, fatigue, feelings of hopelessness, insomnia, feelings of worthlessness, anhedonia, social isolation, increased irritability, and suicidal ideations. Pt reports several current stressors and possible triggers for his recent suicide attempt, including being kicked out of his former SA tx center about a month ago -- which has resulted in a lot of self-blame and guilt and homelessness, financial stressors, and lack of support system. He says that he typically sleeps in the woods at night. Pt reports that he began drinking around age 44 following the death of his father. Pt denies any other hx of trauma or abuse. He denies any SA besides his etoh abuse.  01/03/15: Pt has been attending and participating in groups. He reports improved mood and no withdrawal symptoms. Pt reports that he is hoping to [facility] and plans to contact [name] while at [facility]. [facility] not open to [facility] "they won't let me take my psych meds."    Additional comments:  Patient and CSW reviewed Patient Discharge Process Letter/Patient Involvement Form.  Patient verbalized understanding and signed form.  Patient and CSW also reviewed and identified patient's goals and treatment plan.  Patient verbalized understanding and agreed to plan.  Estimated length of stay:  1 day  Review of initial/current patient goals per problem list:  See treatment plan   Attendees: Patient 01/03/2015 12:35 PM   Family:   01/03/2015 12:35 PM   Physician:  Geoffery LyonsIrving Lugo, MD 01/03/2015 12:35 PM   Nursing:   Cala BradfordKimberly RN; Griffin Dakinonecia RN 01/03/2015 12:35 PM   Clinical Social Worker:  Trula SladeHeather Smart, LCSWA   01/03/2015 12:35 PM   Clinical Social Worker:  Santa GeneraAnne Cunningham, LCSW; Belenda CruiseKristin Drinkard LCSWA;  Juline PatchQuylle Hodnett, LCSW  01/03/2015 12:35 PM   Case Manager:  Onnie BoerJennifer Clark, RN 01/03/2015 12:35 PM   Other:  Leisa LenzValerie Enoch, [facility]   01/03/2015 12:35 PM  Other:   01/03/2015  12:35 PM   Other:  01/03/2015 12:35 PM   Other:  01/03/2015 12:35 PM   Other:  01/03/2015 12:35 PM   Other:   01/03/2015 12:35 PM   Other:   01/03/2015 12:35 PM   Other:  01/03/2015 12:35 PM   Other:   01/03/2015 12:35 PM    Scribe for Treatment Team:   Trula SladeHeather Smart, LCSWA Clinical Social Worker 01/03/2015 12:39 PM

## 2015-01-04 NOTE — Progress Notes (Signed)
Patient's am B/P elevated this morning. Her morning dose of B/P medication given. Patient ready to be discharged  [facility]. Charge Nurse [name], RN helped with the discharge. Patient denied SI/HI and denied Hallucinations. Staff to call [employer] and patient will ride with 3 other patient.

## 2015-01-31 ENCOUNTER — Emergency Department (HOSPITAL_BASED_OUTPATIENT_CLINIC_OR_DEPARTMENT_OTHER)
Admission: EM | Admit: 2015-01-31 | Discharge: 2015-01-31 | Disposition: A | Discharge status: Home / Self Care | Payer: Self-pay | Attending: Emergency Medicine | Admitting: Emergency Medicine

## 2015-01-31 ENCOUNTER — Encounter (HOSPITAL_BASED_OUTPATIENT_CLINIC_OR_DEPARTMENT_OTHER): Payer: Self-pay | Admitting: *Deleted

## 2015-01-31 DIAGNOSIS — F329 Major depressive disorder, single episode, unspecified: Secondary | ICD-10-CM | POA: Insufficient documentation

## 2015-01-31 DIAGNOSIS — I159 Secondary hypertension, unspecified: Secondary | ICD-10-CM | POA: Insufficient documentation

## 2015-01-31 DIAGNOSIS — Z720 Tobacco use: Secondary | ICD-10-CM | POA: Insufficient documentation

## 2015-01-31 DIAGNOSIS — M7989 Other specified soft tissue disorders: Secondary | ICD-10-CM

## 2015-01-31 DIAGNOSIS — Z880 Allergy status to penicillin: Secondary | ICD-10-CM | POA: Insufficient documentation

## 2015-01-31 DIAGNOSIS — R2243 Localized swelling, mass and lump, lower limb, bilateral: Secondary | ICD-10-CM | POA: Insufficient documentation

## 2015-01-31 DIAGNOSIS — Z79899 Other long term (current) drug therapy: Secondary | ICD-10-CM | POA: Insufficient documentation

## 2015-01-31 LAB — BASIC METABOLIC PANEL
Anion gap: 6 (ref 5–15)
BUN: 16 mg/dL (ref 6–20)
CALCIUM: 8.7 mg/dL — AB (ref 8.9–10.3)
CO2: 27 mmol/L (ref 22–32)
Chloride: 106 mmol/L (ref 101–111)
Creatinine, Ser: 1.41 mg/dL — ABNORMAL HIGH (ref 0.61–1.24)
GFR, EST NON AFRICAN AMERICAN: 60 mL/min — AB (ref >60)
Glucose, Bld: 131 mg/dL — ABNORMAL HIGH (ref 65–99)
POTASSIUM: 4.1 mmol/L (ref 3.5–5.1)
SODIUM: 139 mmol/L (ref 135–145)

## 2015-01-31 MED ORDER — HYDROCHLOROTHIAZIDE 25 MG PO TABS: 25.0000 mg | ORAL_TABLET | Freq: Every day | ORAL | Status: DC

## 2015-01-31 MED ORDER — FUROSEMIDE 20 MG PO TABS
20.0000 mg | ORAL_TABLET | Freq: Once | ORAL | Status: AC
  Administered 2015-01-31: 20 mg via ORAL
  Filled 2015-01-31: qty 1

## 2015-01-31 NOTE — Discharge Instructions (Signed)
Please follow up with your primary care physician in 1-2 days. If you do not have one please call the [facility] number listed above.Please read all discharge instructions and return precautions.  ° ° °Hypertension °Hypertension, commonly called high blood pressure, is when the force of blood pumping through your arteries is too strong. Your arteries are the blood vessels that carry blood from your heart throughout your body. A blood pressure reading consists of a higher number over a lower number, such as 110/72. The higher number (systolic) is the pressure inside your arteries when your heart pumps. The lower number (diastolic) is the pressure inside your arteries when your heart relaxes. Ideally you want your blood pressure below 120/80. °Hypertension forces your heart to work harder to pump blood. Your arteries may become narrow or stiff. Having hypertension puts you at risk for heart disease, stroke, and other problems.  °RISK FACTORS °Some risk factors for high blood pressure are controllable. Others are not.  °Risk factors you cannot control include:  °· Race. You may be at higher risk if you are African American. °· Age. Risk increases with age. °· Gender. Men are at higher risk than women before age 45 years. After age 65, women are at higher risk than men. °Risk factors you can control include: °· Not getting enough exercise or physical activity. °· Being overweight. °· Getting too much fat, sugar, calories, or salt in your diet. °· Drinking too much alcohol. °SIGNS AND SYMPTOMS °Hypertension does not usually cause signs or symptoms. Extremely high blood pressure (hypertensive crisis) may cause headache, anxiety, shortness of breath, and nosebleed. °DIAGNOSIS  °To check if you have hypertension, your health care provider will measure your blood pressure while you are seated, with your arm held at the level of your heart. It should be measured at least twice using the same arm. Certain  conditions can cause a difference in blood pressure between your right and left arms. A blood pressure reading that is higher than normal on one occasion does not mean that you need treatment. If one blood pressure reading is high, ask your health care provider about having it checked again. °TREATMENT  °Treating high blood pressure includes making lifestyle changes and possibly taking medicine. Living a healthy lifestyle can help lower high blood pressure. You may need to change some of your habits. °Lifestyle changes may include: °· Following the DASH diet. This diet is high in fruits, vegetables, and whole grains. It is low in salt, red meat, and added sugars. °· Getting at least 2½ hours of brisk physical activity every week. °· Losing weight if necessary. °· Not smoking. °· Limiting alcoholic beverages. °· Learning ways to reduce stress. ° If lifestyle changes are not enough to get your blood pressure under control, your health care provider may prescribe medicine. You may need to take more than one. Work closely with your health care provider to understand the risks and benefits. °HOME CARE INSTRUCTIONS °· Have your blood pressure rechecked as directed by your health care provider.   °· Take medicines only as directed by your health care provider. Follow the directions carefully. Blood pressure medicines must be taken as prescribed. The medicine does not work as well when you skip doses. Skipping doses also puts you at risk for problems.   °· Do not smoke.   °· Monitor your blood pressure at home as directed by your health care provider.  °SEEK MEDICAL CARE IF:  °· You think you are having a reaction   reaction to medicines taken.  You have recurrent headaches or feel dizzy.  You have swelling in your ankles.  You have trouble with your vision. SEEK IMMEDIATE MEDICAL CARE IF:  You develop a severe headache or confusion.  You have unusual weakness, numbness, or feel faint.  You have severe chest or abdominal  pain.  You vomit repeatedly.  You have trouble breathing. MAKE SURE YOU:   Understand these instructions.  Will watch your condition.  Will get help right away if you are not doing well or get worse. Document Released: 08/11/2005 Document Revised: 12/26/2013 Document Reviewed: 06/03/2013 [facility] Patient Information 2015 [employer] [place]. This information is not intended to replace advice given to you by your health care provider. Make sure you discuss any questions you have with your health care provider.  Edema Edema is an abnormal buildup of fluids in your bodytissues. Edema is somewhatdependent on gravity to pull the fluid to the lowest place in your body. That makes the condition more common in the legs and thighs (lower extremities). Painless swelling of the feet and ankles is common and becomes more likely as you get older. It is also common in looser tissues, like around your eyes.  When the affected area is squeezed, the fluid may move out of that spot and leave a dent for a few moments. This dent is called pitting.  CAUSES  There are many possible causes of edema. Eating too much salt and being on your feet or sitting for a long time can cause edema in your legs and ankles. Hot weather may make edema worse. Common medical causes of edema include:  Heart failure.  Liver disease.  Kidney disease.  Weak blood vessels in your legs.  Cancer.  An injury.  Pregnancy.  Some medications.  Obesity. SYMPTOMS  Edema is usually painless.Your skin may look swollen or shiny.  DIAGNOSIS  Your health care provider may be able to diagnose edema by asking about your medical history and doing a physical exam. You may need to have tests such as X-rays, an electrocardiogram, or blood tests to check for medical conditions that may cause edema.  TREATMENT  Edema treatment depends on the cause. If you have heart, liver, or kidney disease, you need the treatment appropriate for these  conditions. General treatment may include:  Elevation of the affected body part above the level of your heart.  Compression of the affected body part. Pressure from elastic bandages or support stockings squeezes the tissues and forces fluid back into the blood vessels. This keeps fluid from entering the tissues.  Restriction of fluid and salt intake.  Use of a water pill (diuretic). These medications are appropriate only for some types of edema. They pull fluid out of your body and make you urinate more often. This gets rid of fluid and reduces swelling, but diuretics can have side effects. Only use diuretics as directed by your health care provider. HOME CARE INSTRUCTIONS   Keep the affected body part above the level of your heart when you are lying down.   Do not sit still or stand for prolonged periods.   Do not put anything directly under your knees when lying down.  Do not wear constricting clothing or garters on your upper legs.   Exercise your legs to work the fluid back into your blood vessels. This may help the swelling go down.   Wear elastic bandages or support stockings to reduce ankle swelling as directed by your health care provider.  Eat a low-salt diet to reduce fluid if your health care provider recommends it.   Only take medicines as directed by your health care provider. SEEK MEDICAL CARE IF:   Your edema is not responding to treatment.  You have heart, liver, or kidney disease and notice symptoms of edema.  You have edema in your legs that does not improve after elevating them.   You have sudden and unexplained weight gain. SEEK IMMEDIATE MEDICAL CARE IF:   You develop shortness of breath or chest pain.   You cannot breathe when you lie down.  You develop pain, redness, or warmth in the swollen areas.   You have heart, liver, or kidney disease and suddenly get edema.  You have a fever and your symptoms suddenly get worse. MAKE SURE YOU:    Understand these instructions.  Will watch your condition.  Will get help right away if you are not doing well or get worse. Document Released: 08/11/2005 Document Revised: 12/26/2013 Document Reviewed: 06/03/2013 [facility] Patient Information 2015 [place], [place]. This information is not intended to replace advice given to you by your health care provider. Make sure you discuss any questions you have with your health care provider.

## 2015-01-31 NOTE — ED Notes (Signed)
Pt reports one week of leg and feet swelling beyond his usual leg swelling. Pt denies pain, states "they feel numb sometimes if anything.Marland Kitchen."

## 2015-01-31 NOTE — ED Provider Notes (Signed)
CSN: [id number]     Arrival date & time 01/31/15  1310 History   First MD Initiated Contact with Patient 01/31/15 1315     Chief Complaint  Patient presents with  . Leg Swelling     (Consider location/radiation/quality/duration/timing/severity/associated sxs/prior Treatment) HPI Comments: Patient is a 44 yo M PMHx significant for HTN, tobacco abuse presenting to the ED for worsening bilateral leg and feet swelling. Patient states he [facility] d/t recurrent flares of gout and since then he has had difficulty with worsening leg swelling. He denies any pain, but does endorse occasional tingling sensation. Denies any fevers, redness or warmth to the legs. No modifying factors identified. Denies CP, SOB, orthopnea, PND, cough, fevers, redness or warmth to legs.   Patient is a 44 y.o. male presenting with leg pain.  Leg Pain Location:  Leg Injury: no   Leg location:  L lower leg and R lower leg Pain details:    Quality:  Tingling   Radiates to:  Does not radiate   Severity:  No pain   Duration:  1 week Tetanus status:  Up to date Relieved by:  None tried Worsened by:  Nothing tried Ineffective treatments:  None tried Associated symptoms: no fever     Past Medical History  Diagnosis Date  . Hypertension   . Depression    Past Surgical History  Procedure Laterality Date  . Wrist surgery     Family History  Problem Relation Age of Onset  . Hypertension Mother   . Heart disease Father     Rare heart disease  . Heart disease Brother     Rare heart disease (died)   History  Substance Use Topics  . Smoking status: Light Tobacco Smoker -- 0.20 packs/day    Types: Cigarettes  . Smokeless tobacco: Never Used  . Alcohol Use: 0.6 oz/week    0 Standard drinks or equivalent, 1 Cans of beer per week    Review of Systems  Constitutional: Negative for fever.  Respiratory: Negative for cough and shortness of breath.   Cardiovascular: Positive for leg swelling.  Negative for chest pain and palpitations.  All other systems reviewed and are negative.     Allergies  Penicillins; Lipitor; and Lisinopril  Home Medications   Prior to Admission medications   Medication Sig Start Date End Date Taking? Authorizing Provider  amLODipine (NORVASC) 10 MG tablet Take 1 tablet (10 mg total) by mouth daily. For high blood pressure 01/03/15   Shuvon B Rankin, NP  buPROPion (WELLBUTRIN XL) 300 MG 24 hr tablet Take 1 tablet (300 mg total) by mouth daily. For depression 01/03/15   Shuvon B Rankin, NP  hydrochlorothiazide (HYDRODIURIL) 25 MG tablet Take 1 tablet (25 mg total) by mouth daily. 01/31/15   Daymond Cordts, PA-C  ibuprofen (ADVIL,MOTRIN) 200 MG tablet Take 3-4 tablets (600-800 mg total) by mouth every 8 (eight) hours as needed for moderate pain. 01/03/15   Adonis BrookSheila Agustin, NP  metoprolol tartrate (LOPRESSOR) 25 MG tablet Take 1 tablet (25 mg total) by mouth 2 (two) times daily. For high blood pressure 01/03/15   Shuvon B Rankin, NP  mirtazapine (REMERON) 15 MG tablet Take 1 tablet (15 mg total) by mouth at bedtime. For sleep/Depression 01/03/15   Shuvon B Rankin, NP   BP 131/57 mmHg  Pulse 60  Temp(Src) 98.4 F (36.9 C) (Oral)  Resp 18  Ht 6' (1.829 m)  Wt 370 lb (167.831 kg)  BMI  50.17 kg/m2  SpO2 100% Physical Exam  Constitutional: He is oriented to person, place, and time. He appears well-developed and well-nourished. No distress.  HENT:  Head: Normocephalic and atraumatic.  Right Ear: External ear normal.  Left Ear: External ear normal.  Nose: Nose normal.  Mouth/Throat: Oropharynx is clear and moist.  Eyes: Conjunctivae are normal.  Neck: Normal range of motion. Neck supple.  No nuchal rigidity.   Cardiovascular: Normal rate, regular rhythm and normal heart sounds.   Pulmonary/Chest: Effort normal and breath sounds normal.  Abdominal: Soft. There is no tenderness.  Musculoskeletal: Normal range of motion. He exhibits edema (1+ bilateral  non-pitting LE no erythema or warmth non-TTP).  Neurological: He is alert and oriented to person, place, and time.  Skin: Skin is warm and dry. He is not diaphoretic.  Psychiatric: He has a normal mood and affect.  Nursing note and vitals reviewed.   ED Course  Procedures (including critical care time) Medications  furosemide (LASIX) tablet 20 mg (20 mg Oral Given 01/31/15 1440)    Labs Review Labs Reviewed  BASIC METABOLIC PANEL - Abnormal; Notable for the following:    Glucose, Bld 131 (*)    Creatinine, Ser 1.41 (*)    Calcium 8.7 (*)    GFR calc non Af Amer 60 (*)    All other components within normal limits    Imaging Review No results found.   EKG Interpretation None      MDM   Final diagnoses:  Leg swelling  Secondary hypertension, unspecified    Filed Vitals:   01/31/15 1436  BP: 131/57  Pulse: 60  Temp:   Resp: 18   Afebrile, NAD, non-toxic appearing, AAOx4.   Patient presenting with bilateral lower extremity non-pitting edema worsening after being taken off of his HCTZ. No complaints of chest pain or shortness of breath. There is no erythema, warmth or tenderness to suggest superimposed infection. Creatinine mildly elevated, creatinine clearance within normal limits. Will give dose of Lasix here and restart patient on hydrochlorothiazide.  No signs of hypertensive urgency.  Advised primary care physician follow-up for creatinine recheck as well as blood pressure recheck.  Discussed with patient the need for close follow-up and management by their primary care physician. Patient d/w with Dr. Silverio Lay, agrees with plan.       Francee Piccolo, PA-C 01/31/15 1633  Richardean Canal, MD 02/01/15 0700

## 2015-05-22 ENCOUNTER — Encounter: Payer: Self-pay | Admitting: Family Medicine

## 2015-05-22 ENCOUNTER — Ambulatory Visit (HOSPITAL_COMMUNITY)
Admission: RE | Admit: 2015-05-22 | Discharge: 2015-05-22 | Disposition: A | Discharge status: Home / Self Care | Payer: Self-pay | Source: Ambulatory Visit | Attending: Family Medicine | Admitting: Family Medicine

## 2015-05-22 ENCOUNTER — Ambulatory Visit: Payer: Self-pay | Attending: Family Medicine | Admitting: Family Medicine

## 2015-05-22 VITALS — BP 145/89 | HR 55 | Temp 98.7°F | Resp 16 | Ht 72.0 in | Wt 398.0 lb

## 2015-05-22 DIAGNOSIS — I10 Essential (primary) hypertension: Secondary | ICD-10-CM | POA: Insufficient documentation

## 2015-05-22 DIAGNOSIS — N63 Unspecified lump in breast: Secondary | ICD-10-CM | POA: Insufficient documentation

## 2015-05-22 DIAGNOSIS — N6310 Unspecified lump in the right breast, unspecified quadrant: Secondary | ICD-10-CM

## 2015-05-22 DIAGNOSIS — Z720 Tobacco use: Secondary | ICD-10-CM

## 2015-05-22 DIAGNOSIS — F17200 Nicotine dependence, unspecified, uncomplicated: Secondary | ICD-10-CM

## 2015-05-22 DIAGNOSIS — Z87891 Personal history of nicotine dependence: Secondary | ICD-10-CM | POA: Insufficient documentation

## 2015-05-22 LAB — CBC WITH DIFFERENTIAL/PLATELET
BASOS ABS: 0.1 10*3/uL (ref 0.0–0.1)
Basophils Relative: 1 % (ref 0–1)
EOS ABS: 0.4 10*3/uL (ref 0.0–0.7)
Eosinophils Relative: 5 % (ref 0–5)
HCT: 45 % (ref 39.0–52.0)
Hemoglobin: 14.5 g/dL (ref 13.0–17.0)
LYMPHS ABS: 2.3 10*3/uL (ref 0.7–4.0)
Lymphocytes Relative: 31 % (ref 12–46)
MCH: 23.8 pg — ABNORMAL LOW (ref 26.0–34.0)
MCHC: 32.2 g/dL (ref 30.0–36.0)
MCV: 73.8 fL — AB (ref 78.0–100.0)
MPV: 10.4 fL (ref 8.6–12.4)
Monocytes Absolute: 0.7 10*3/uL (ref 0.1–1.0)
Monocytes Relative: 10 % (ref 3–12)
Neutro Abs: 3.9 10*3/uL (ref 1.7–7.7)
Neutrophils Relative %: 53 % (ref 43–77)
Platelets: 253 10*3/uL (ref 150–400)
RBC: 6.1 MIL/uL — ABNORMAL HIGH (ref 4.22–5.81)
RDW: 16.1 % — AB (ref 11.5–15.5)
WBC: 7.4 10*3/uL (ref 4.0–10.5)

## 2015-05-22 LAB — COMPLETE METABOLIC PANEL WITH GFR
ALT: 24 U/L (ref 9–46)
AST: 23 U/L (ref 10–40)
Albumin: 3.9 g/dL (ref 3.6–5.1)
Alkaline Phosphatase: 76 U/L (ref 40–115)
BUN: 13 mg/dL (ref 7–25)
CALCIUM: 9.3 mg/dL (ref 8.6–10.3)
CHLORIDE: 106 mmol/L (ref 98–110)
CO2: 25 mmol/L (ref 20–31)
CREATININE: 1.44 mg/dL — AB (ref 0.60–1.35)
GFR, EST AFRICAN AMERICAN: 68 mL/min (ref >=60)
GFR, EST NON AFRICAN AMERICAN: 59 mL/min — AB (ref >=60)
Glucose, Bld: 85 mg/dL (ref 65–99)
Potassium: 4.5 mmol/L (ref 3.5–5.3)
Sodium: 138 mmol/L (ref 135–146)
Total Bilirubin: 0.4 mg/dL (ref 0.2–1.2)
Total Protein: 6.9 g/dL (ref 6.1–8.1)

## 2015-05-22 MED ORDER — HYDRALAZINE HCL 10 MG PO TABS: 10.0000 mg | ORAL_TABLET | Freq: Three times a day (TID) | ORAL | Status: AC

## 2015-05-22 NOTE — Assessment & Plan Note (Signed)
A: R breast mass in male smoker No evidence of infection, will rule out malignancy P: Start with CXR and blood work Plan for f/u US if CXR is not revealing.

## 2015-05-22 NOTE — Progress Notes (Signed)
Patient ID: [name] Donovan, male   DOB: 03/09/1971, 44 y.o.   MRN: [id number]   Subjective:  Patient ID: [name] Donovan, male    DOB: Jul 03, 1971  Age: 44 y.o. MRN: [id number]  CC: Breast Pain   HPI [name] Donovan presents for   1. R breast pain: noticed 10 days ago. Noticed a lump. No fever, chills, weight loss. Patient is a smoker off and on since his teens. He has hx of ETOH abuse. He denies excessive ETOH.   No leg swelling. Family hx of lung cancer in his mother. His mother also a smoker. No L sided breast pain. No R shoulder or axilla pain. No trauma to chest wall.   2. CHRONIC HYPERTENSION with associated grade 1 diastolic dysfunction   Disease Monitoring  Blood pressure range: not checking   Chest pain: no   Dyspnea: no   Claudication: no   Medication compliance: no, taking norvasc 5 mg and metoprolol only. No HCTZ due to gout. Decreased norvasc due to peripheral edema.  Medication Side Effects  Lightheadedness: no   Urinary frequency: no   Edema: yes     Preventitive Healthcare:  Exercise:   Diet Pattern: quick meals   Salt Restriction: no   Social History  Substance Use Topics  . Smoking status: Light Tobacco Smoker -- 0.20 packs/day    Types: Cigarettes  . Smokeless tobacco: Never Used  . Alcohol Use: 0.6 oz/week    0 Standard drinks or equivalent, 1 Cans of beer per week   Outpatient Prescriptions Prior to Visit  Medication Sig Dispense Refill  . buPROPion (WELLBUTRIN XL) 300 MG 24 hr tablet Take 1 tablet (300 mg total) by mouth daily. For depression 30 tablet 0  . ibuprofen (ADVIL,MOTRIN) 200 MG tablet Take 3-4 tablets (600-800 mg total) by mouth every 8 (eight) hours as needed for moderate pain. 30 tablet 0  . metoprolol tartrate (LOPRESSOR) 25 MG tablet Take 1 tablet (25 mg total) by mouth 2 (two) times daily. For high blood pressure 60 tablet 2  . amLODipine (NORVASC) 10 MG tablet Take 1 tablet (10 mg total) by mouth daily. For high blood pressure 30  tablet 0  . hydrochlorothiazide (HYDRODIURIL) 25 MG tablet Take 1 tablet (25 mg total) by mouth daily. 21 tablet 0  . mirtazapine (REMERON) 15 MG tablet Take 1 tablet (15 mg total) by mouth at bedtime. For sleep/Depression 30 tablet 0   No facility-administered medications prior to visit.    ROS Review of Systems  Constitutional: Negative for fever, chills, fatigue and unexpected weight change.  Eyes: Negative for visual disturbance.  Respiratory: Negative for cough and shortness of breath.   Cardiovascular: Positive for leg swelling. Negative for chest pain and palpitations.  Gastrointestinal: Negative for nausea, vomiting, abdominal pain, diarrhea, constipation and blood in stool.  Endocrine: Negative for polydipsia, polyphagia and polyuria.  Musculoskeletal: Negative for myalgias, back pain, arthralgias, gait problem and neck pain.  Skin: Negative for rash.  Allergic/Immunologic: Negative for immunocompromised state.  Hematological: Negative for adenopathy. Does not bruise/bleed easily.  Psychiatric/Behavioral: Negative for suicidal ideas, sleep disturbance and dysphoric mood. The patient is not nervous/anxious.     Objective:  BP 145/89 mmHg  Pulse 55  Temp(Src) 98.7 F (37.1 C) (Oral)  Resp 16  Ht 6' (1.829 m)  Wt 398 lb (180.532 kg)  BMI 53.97 kg/m2  SpO2 99%  BP/Weight 05/22/2015 01/31/2015 12/27/2014  Systolic BP 145 131 147  Diastolic BP 89 57 93  Wt. (Lbs) 398  370 -  BMI 53.97 50.17 -  Some encounter information is confidential and restricted. Go to Review Flowsheets activity to see all data.   Physical Exam  Constitutional: He appears well-developed and well-nourished. No distress.  Morbidly obese   HENT:  Head: Normocephalic and atraumatic.  Neck: Normal range of motion. Neck supple.  Cardiovascular: Normal rate, regular rhythm, normal heart sounds and intact distal pulses.   Pulmonary/Chest: Effort normal and breath sounds normal. Right breast exhibits mass.  Right breast exhibits no inverted nipple, no nipple discharge, no skin change and no tenderness. Left breast exhibits no inverted nipple, no mass, no nipple discharge, no skin change and no tenderness. Breasts are symmetrical.    Musculoskeletal: He exhibits edema (1+ LE edema ).  Lymphadenopathy:    He has no cervical adenopathy.    He has no axillary adenopathy.  Neurological: He is alert.  Skin: Skin is warm and dry. No rash noted. No erythema.  Psychiatric: He has a normal mood and affect.     Assessment & Plan:   Problem List Items Addressed This Visit    Accelerated hypertension - Primary (Chronic)    A; BP elevated Med: non compliant P: STOP norvasc. Removed HCTZ and norvasc from med list.  Start hydralazine 10 mg TID       Relevant Medications   hydrALAZINE (APRESOLINE) 10 MG tablet   Breast mass, right    A: R breast mass in male smoker No evidence of infection, will rule out malignancy P: Start with CXR and blood work Plan for f/u US if CXR is not revealing.          Relevant Orders   DG Chest 2 View   CBC with Differential   COMPLETE METABOLIC PANEL WITH GFR   Current smoker    Smoking: Smoking cessation support: smoking cessation hotline: 1-800-QUIT-NOW.  Smoking cessation classes are available through [facility]. Call [phone] or visit our website at [email or web address].          No orders of the defined types were placed in this encounter.    Follow-up: No Follow-up on file.   Dessa Phi MD

## 2015-05-22 NOTE — Patient Instructions (Addendum)
[name] Donovan,  Thank you for coming in today. It was a pleasure meeting you. I look forward to being your primary doctor.  1. Smoking: Smoking cessation support: smoking cessation hotline: 1-800-QUIT-NOW.  Smoking cessation classes are available through [facility]. Call [phone] or visit our website at [email or web address].  2. R breast mass: No evidence of infection, will rule out abnormal tissue Start with CXR and blood work Plan for f/u US if CXR is not revealing.   3. HTN: STOP norvasc. Removed HCTZ and norvasc from med list.  Start hydralazine 10 mg TID  F/u in 4 weeks for RN BP check F/u with me in 8 weeks for HTN and R breast mass   Dr. Armen Pickup

## 2015-05-22 NOTE — Assessment & Plan Note (Signed)
A; BP elevated Med: non compliant P: STOP norvasc. Removed HCTZ and norvasc from med list.  Start hydralazine 10 mg TID

## 2015-05-22 NOTE — Progress Notes (Signed)
C/C Bump on rt breast area x 10 day Painful with touch  Hx tobacco 5 cigarette per day

## 2015-05-22 NOTE — Assessment & Plan Note (Signed)
Smoking:  Smoking cessation support: smoking cessation hotline: 1-800-QUIT-NOW.  Smoking cessation classes are available through [facility]. Call [phone] or visit our website at [email or web address]. 

## 2015-05-23 NOTE — Addendum Note (Signed)
Addended by: Dessa Phi on: 05/23/2015 08:58 AM   Modules accepted: Orders

## 2015-05-24 ENCOUNTER — Telehealth: Payer: Self-pay | Admitting: *Deleted

## 2015-05-24 NOTE — Telephone Encounter (Signed)
Date of birth verified by pt Normal xray, CMP CBC results given Stable slightly elevated Cr on CMP advised to gain control of BP  With medication compliance and low salt diet to prevent kidney disease  Pt verbalized understanding  [place] R breast will be schedule tomorrow

## 2015-05-24 NOTE — Telephone Encounter (Signed)
-----   Message from Dessa Phi, MD sent at 05/22/2015  3:20 PM EDT ----- Normal CXR Will awaits labs Then order [place]

## 2015-05-24 NOTE — Telephone Encounter (Signed)
-----   Message from Dessa Phi, MD sent at 05/23/2015  8:54 AM EDT ----- Normal CMP with stable slightly elevated Cr of 1.4, patient advised gain control of BP with medication compliance and low salt diet to prevent kidney disease  Normal CBC  R breast US ordered and needs to be scheduled

## 2015-05-25 ENCOUNTER — Other Ambulatory Visit: Payer: Self-pay | Admitting: Family Medicine

## 2015-05-25 DIAGNOSIS — N6310 Unspecified lump in the right breast, unspecified quadrant: Secondary | ICD-10-CM

## 2015-05-25 NOTE — Telephone Encounter (Signed)
[place] appointment on Jun 04, 2015 at 08:00 at [facility]  Pt notified, appointment information given to pt

## 2015-05-31 ENCOUNTER — Other Ambulatory Visit: Payer: Self-pay

## 2015-05-31 ENCOUNTER — Telehealth: Payer: Self-pay | Admitting: Family Medicine

## 2015-05-31 NOTE — Telephone Encounter (Signed)
Patient called stating he had an appt. Scheduled for an ultrasound regarding his breast. The facility where he was referred to could not be able to see him b/c he has no insurance. Patient would like to know what to do next. Please f/u with pt.

## 2015-06-04 ENCOUNTER — Other Ambulatory Visit: Payer: Self-pay

## 2015-06-05 ENCOUNTER — Ambulatory Visit
Admission: RE | Admit: 2015-06-05 | Discharge: 2015-06-05 | Disposition: A | Discharge status: Home / Self Care | Payer: No Typology Code available for payment source | Source: Ambulatory Visit | Attending: Family Medicine | Admitting: Family Medicine

## 2015-06-05 DIAGNOSIS — N6310 Unspecified lump in the right breast, unspecified quadrant: Secondary | ICD-10-CM

## 2018-01-14 ENCOUNTER — Other Ambulatory Visit (HOSPITAL_BASED_OUTPATIENT_CLINIC_OR_DEPARTMENT_OTHER): Payer: Self-pay

## 2018-01-14 DIAGNOSIS — G2581 Restless legs syndrome: Secondary | ICD-10-CM

## 2018-01-14 DIAGNOSIS — G4730 Sleep apnea, unspecified: Secondary | ICD-10-CM

## 2018-01-14 DIAGNOSIS — G4710 Hypersomnia, unspecified: Secondary | ICD-10-CM

## 2018-01-14 DIAGNOSIS — R5383 Other fatigue: Secondary | ICD-10-CM

## 2018-01-14 DIAGNOSIS — R454 Irritability and anger: Secondary | ICD-10-CM

## 2018-01-14 DIAGNOSIS — G4700 Insomnia, unspecified: Secondary | ICD-10-CM

## 2018-01-14 DIAGNOSIS — R0683 Snoring: Secondary | ICD-10-CM

## 2018-02-11 ENCOUNTER — Ambulatory Visit (HOSPITAL_BASED_OUTPATIENT_CLINIC_OR_DEPARTMENT_OTHER): Payer: Self-pay | Attending: *Deleted | Admitting: Internal Medicine

## 2018-02-11 VITALS — Ht 72.0 in | Wt >= 6400 oz

## 2018-02-11 DIAGNOSIS — G4710 Hypersomnia, unspecified: Secondary | ICD-10-CM

## 2018-02-11 DIAGNOSIS — E669 Obesity, unspecified: Secondary | ICD-10-CM | POA: Insufficient documentation

## 2018-02-11 DIAGNOSIS — G4733 Obstructive sleep apnea (adult) (pediatric): Secondary | ICD-10-CM

## 2018-02-11 DIAGNOSIS — R0683 Snoring: Secondary | ICD-10-CM

## 2018-02-11 DIAGNOSIS — R5383 Other fatigue: Secondary | ICD-10-CM

## 2018-02-11 DIAGNOSIS — G2581 Restless legs syndrome: Secondary | ICD-10-CM

## 2018-02-11 DIAGNOSIS — R454 Irritability and anger: Secondary | ICD-10-CM

## 2018-02-11 DIAGNOSIS — G4700 Insomnia, unspecified: Secondary | ICD-10-CM

## 2018-02-11 DIAGNOSIS — G4730 Sleep apnea, unspecified: Secondary | ICD-10-CM

## 2018-02-11 DIAGNOSIS — I493 Ventricular premature depolarization: Secondary | ICD-10-CM | POA: Insufficient documentation

## 2018-02-11 DIAGNOSIS — Z6841 Body Mass Index (BMI) 40.0 and over, adult: Secondary | ICD-10-CM | POA: Insufficient documentation

## 2018-02-25 DIAGNOSIS — R0683 Snoring: Secondary | ICD-10-CM

## 2018-02-25 NOTE — Procedures (Signed)
Patient Name: [name] Donovan, [name] Donovan Date: 02/11/2018 Gender: Male D.O.B: Feb 13, 1971 Age (years): 22 Referring Provider: Marliss Coots NP Height (inches): 30 Interpreting Physician: Baird Lyons MD, ABSM Weight (lbs): 400 RPSGT: Baxter Flattery BMI: 57 MRN: [id number] Neck Size: 23.00  CLINICAL INFORMATION Sleep Study Type: Split Night CPAP Indication for sleep study: Excessive Daytime Sleepiness, Fatigue, Obesity, OSA, Snoring, Witnessed Apneas Epworth Sleepiness Score: 12  SLEEP STUDY TECHNIQUE As per the AASM Manual for the Scoring of Sleep and Associated Events v2.3 (April 2016) with a hypopnea requiring 4% desaturations.  The channels recorded and monitored were frontal, central and occipital EEG, electrooculogram (EOG), submentalis EMG (chin), nasal and oral airflow, thoracic and abdominal wall motion, anterior tibialis EMG, snore microphone, electrocardiogram, and pulse oximetry. Continuous positive airway pressure (CPAP) was initiated when the patient met split night criteria and was titrated according to treat sleep-disordered breathing.  MEDICATIONS Medications self-administered by patient taken the night of the study : none reported  RESPIRATORY PARAMETERS Diagnostic  Total AHI (/hr): 74.3 RDI (/hr): 74.3 OA Index (/hr): 71.6 CA Index (/hr): 0.0 REM AHI (/hr): 76.8 NREM AHI (/hr): 74.1 Supine AHI (/hr): 74.3 Non-supine AHI (/hr): N/A Min O2 Sat (%): 57.0 Mean O2 (%): 86.7 Time below 88% (min): 78.7   Titration  Optimal Pressure (cm): 15 AHI at Optimal Pressure (/hr): 1.1 Min O2 at Optimal Pressure (%): 88.0 Supine % at Optimal (%): 100 Sleep % at Optimal (%): 99   SLEEP ARCHITECTURE The recording time for the entire night was 412.5 minutes.  During a baseline period of 158.5 minutes, the patient slept for 155.0 minutes in REM and nonREM, yielding a sleep efficiency of 97.8%%. Sleep onset after lights out was 0.7 minutes with a REM latency of 141.0 minutes.  The patient spent 4.8%% of the night in stage N1 sleep, 87.1%% in stage N2 sleep, 0.0%% in stage N3 and 8.1%% in REM.  During the titration period of 250.4 minutes, the patient slept for 246.5 minutes in REM and nonREM, yielding a sleep efficiency of 98.4%%. Sleep onset after CPAP initiation was 2.6 minutes with a REM latency of 42.0 minutes. The patient spent 3.4%% of the night in stage N1 sleep, 54.0%% in stage N2 sleep, 0.0%% in stage N3 and 42.6%% in REM.  CARDIAC DATA The 2 lead EKG demonstrated sinus rhythm. The mean heart rate was 100.0 beats per minute. Other EKG findings include: PVCs.  LEG MOVEMENT DATA The total Periodic Limb Movements of Sleep (PLMS) were 0. The PLMS index was 0.0 .  IMPRESSIONS - Severe obstructive sleep apnea occurred during the diagnostic portion of the study (AHI = 74.3/hour). An optimal PAP pressure was selected for this patient ( 15 cm of water) - No significant central sleep apnea occurred during the diagnostic portion of the study (CAI = 0.0/hour). - Severe oxygen desaturation was noted during the diagnostic portion of the study (Min O2 = 57.0%). Mean sat at CPAP 15 was 91.2%. - The patient snored with soft snoring volume during the diagnostic portion of the study. - EKG findings include PVCs. - Clinically significant periodic limb movements did not occur during sleep.  DIAGNOSIS - Obstructive Sleep Apnea (327.23 [G47.33 ICD-10])  RECOMMENDATIONS - Trial of CPAP therapy on 15 cm H2O or autopap 10-20. Patient used a Large size Fisher[employer]Paykel Full Face Mask Simplus mask and heated humidification. - Be careful with alcohol, sedatives and other CNS depressants that may worsen sleep apnea and disrupt normal sleep architecture. - Sleep hygiene  should be reviewed to assess factors that may improve sleep quality. - Weight management and regular exercise should be initiated or continued.  [Electronically signed] 02/25/2018 11:45 AM  Baird Lyons MD,  ABSM Diplomate, American Board of Sleep Medicine   NPI: [id number]    [place], [place] [facility]  ELECTRONICALLY SIGNED ON:  02/25/2018, 11:42 AM [place] PH: [phone]   FX: [phone] [place]
